# Patient Record
Sex: Male | Born: 1955
Health system: Southern US, Community
[De-identification: ages and names within clinical notes are randomized; demographics above are authoritative.]

## PROBLEM LIST (undated history)

## (undated) DIAGNOSIS — I10 Essential (primary) hypertension: Secondary | ICD-10-CM

## (undated) DIAGNOSIS — R7989 Other specified abnormal findings of blood chemistry: Secondary | ICD-10-CM

## (undated) DIAGNOSIS — I219 Acute myocardial infarction, unspecified: Secondary | ICD-10-CM

## (undated) DIAGNOSIS — E119 Type 2 diabetes mellitus without complications: Secondary | ICD-10-CM

## (undated) DIAGNOSIS — K219 Gastro-esophageal reflux disease without esophagitis: Secondary | ICD-10-CM

## (undated) DIAGNOSIS — B002 Herpesviral gingivostomatitis and pharyngotonsillitis: Secondary | ICD-10-CM

## (undated) DIAGNOSIS — I2510 Atherosclerotic heart disease of native coronary artery without angina pectoris: Secondary | ICD-10-CM

## (undated) DIAGNOSIS — N200 Calculus of kidney: Secondary | ICD-10-CM

## (undated) DIAGNOSIS — T7840XA Allergy, unspecified, initial encounter: Secondary | ICD-10-CM

## (undated) DIAGNOSIS — E785 Hyperlipidemia, unspecified: Secondary | ICD-10-CM

## (undated) HISTORY — DX: Herpesviral gingivostomatitis and pharyngotonsillitis: B00.2

## (undated) HISTORY — DX: Atherosclerotic heart disease of native coronary artery without angina pectoris: I25.10

## (undated) HISTORY — DX: Type 2 diabetes mellitus without complications: E11.9

## (undated) HISTORY — DX: Hyperlipidemia, unspecified: E78.5

## (undated) HISTORY — PX: TONSILLECTOMY AND ADENOIDECTOMY: SUR1326

## (undated) HISTORY — DX: Essential (primary) hypertension: I10

## (undated) HISTORY — DX: Other specified abnormal findings of blood chemistry: R79.89

## (undated) HISTORY — DX: Allergy, unspecified, initial encounter: T78.40XA

## (undated) HISTORY — PX: LEG WOUND REPAIR / CLOSURE: SUR1143

## (undated) HISTORY — PX: SPLENECTOMY: SUR1306

## (undated) HISTORY — PX: OTHER SURGICAL HISTORY: SHX169

## (undated) HISTORY — DX: Gastro-esophageal reflux disease without esophagitis: K21.9

## (undated) HISTORY — DX: Calculus of kidney: N20.0

## (undated) HISTORY — DX: Acute myocardial infarction, unspecified: I21.9

---
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
## 1998-06-06 ENCOUNTER — Inpatient Hospital Stay (HOSPITAL_COMMUNITY): Admission: EM | Admit: 1998-06-06 | Discharge: 1998-06-07 | Payer: Self-pay | Admitting: Cardiovascular Disease

## 1998-10-31 ENCOUNTER — Encounter: Payer: Self-pay | Admitting: Cardiology

## 1998-10-31 ENCOUNTER — Inpatient Hospital Stay (HOSPITAL_COMMUNITY): Admission: EM | Admit: 1998-10-31 | Discharge: 1998-11-02 | Payer: Self-pay | Admitting: Internal Medicine

## 1998-11-08 ENCOUNTER — Ambulatory Visit (HOSPITAL_COMMUNITY): Admission: RE | Admit: 1998-11-08 | Discharge: 1998-11-08 | Payer: Self-pay | Admitting: Gastroenterology

## 1998-11-08 ENCOUNTER — Encounter: Payer: Self-pay | Admitting: Gastroenterology

## 2001-01-30 ENCOUNTER — Ambulatory Visit (HOSPITAL_COMMUNITY): Admission: RE | Admit: 2001-01-30 | Discharge: 2001-01-30 | Payer: Self-pay | Admitting: Cardiology

## 2002-01-30 ENCOUNTER — Emergency Department (HOSPITAL_COMMUNITY): Admission: EM | Admit: 2002-01-30 | Discharge: 2002-01-30 | Payer: Self-pay | Admitting: Emergency Medicine

## 2002-01-30 ENCOUNTER — Encounter: Payer: Self-pay | Admitting: Emergency Medicine

## 2010-09-04 ENCOUNTER — Inpatient Hospital Stay (HOSPITAL_COMMUNITY)
Admission: EM | Admit: 2010-09-04 | Discharge: 2010-09-07 | Payer: Self-pay | Source: Home / Self Care | Attending: Internal Medicine | Admitting: Internal Medicine
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
## 2010-09-05 LAB — URINALYSIS, ROUTINE W REFLEX MICROSCOPIC
Bilirubin Urine: NEGATIVE
Hgb urine dipstick: NEGATIVE
Ketones, ur: NEGATIVE mg/dL
Nitrite: NEGATIVE
Protein, ur: NEGATIVE mg/dL
Specific Gravity, Urine: 1.018 (ref 1.005–1.030)
Urine Glucose, Fasting: NEGATIVE mg/dL
Urobilinogen, UA: 0.2 mg/dL (ref 0.0–1.0)
pH: 7 (ref 5.0–8.0)

## 2010-09-05 LAB — CARDIAC PANEL(CRET KIN+CKTOT+MB+TROPI)
Relative Index: 13.2 — ABNORMAL HIGH (ref 0.0–2.5)
Total CK: 212 U/L (ref 7–232)
Troponin I: 1.06 ng/mL (ref 0.00–0.06)
Troponin I: 1.44 ng/mL (ref 0.00–0.06)

## 2010-09-05 LAB — COMPREHENSIVE METABOLIC PANEL
Albumin: 3.9 g/dL (ref 3.5–5.2)
Alkaline Phosphatase: 110 U/L (ref 39–117)
BUN: 17 mg/dL (ref 6–23)
Calcium: 9.2 mg/dL (ref 8.4–10.5)
Creatinine, Ser: 0.76 mg/dL (ref 0.4–1.5)
Potassium: 3.9 mEq/L (ref 3.5–5.1)
Total Protein: 6.7 g/dL (ref 6.0–8.3)

## 2010-09-05 LAB — CBC
HCT: 41.9 % (ref 39.0–52.0)
HCT: 43.7 % (ref 39.0–52.0)
Hemoglobin: 14.3 g/dL (ref 13.0–17.0)
MCH: 32.9 pg (ref 26.0–34.0)
MCHC: 34.1 g/dL (ref 30.0–36.0)
MCV: 96.5 fL (ref 78.0–100.0)
MCV: 96.3 fL (ref 78.0–100.0)
RBC: 4.54 MIL/uL (ref 4.22–5.81)
RDW: 14 % (ref 11.5–15.5)
WBC: 20.7 10*3/uL — ABNORMAL HIGH (ref 4.0–10.5)

## 2010-09-05 LAB — LIPID PANEL
Cholesterol: 153 mg/dL (ref 0–200)
Total CHOL/HDL Ratio: 3.6 RATIO

## 2010-09-05 LAB — POCT I-STAT, CHEM 8
BUN: 18 mg/dL (ref 6–23)
Calcium, Ion: 1.07 mmol/L — ABNORMAL LOW (ref 1.12–1.32)
Chloride: 109 mEq/L (ref 96–112)
Creatinine, Ser: 0.9 mg/dL (ref 0.4–1.5)
Glucose, Bld: 118 mg/dL — ABNORMAL HIGH (ref 70–99)
HCT: 51 % (ref 39.0–52.0)
Hemoglobin: 17.3 g/dL — ABNORMAL HIGH (ref 13.0–17.0)
Potassium: 3.8 mEq/L (ref 3.5–5.1)
Sodium: 143 mEq/L (ref 135–145)
TCO2: 25 mmol/L (ref 0–100)

## 2010-09-05 LAB — RAPID URINE DRUG SCREEN, HOSP PERFORMED
Benzodiazepines: NOT DETECTED
Cocaine: NOT DETECTED
Opiates: NOT DETECTED

## 2010-09-05 LAB — BASIC METABOLIC PANEL
BUN: 10 mg/dL (ref 6–23)
Chloride: 109 mEq/L (ref 96–112)
Creatinine, Ser: 0.77 mg/dL (ref 0.4–1.5)
GFR calc Af Amer: >60 mL/min (ref >60)
GFR calc non Af Amer: >60 mL/min (ref >60)
Potassium: 3.6 mEq/L (ref 3.5–5.1)

## 2010-09-05 LAB — PROTIME-INR
INR: 1.16 (ref 0.00–1.49)
INR: 1.29 (ref 0.00–1.49)
Prothrombin Time: 15 seconds (ref 11.6–15.2)

## 2010-09-05 LAB — POCT CARDIAC MARKERS
CKMB, poc: 2.6 ng/mL (ref 1.0–8.0)
Myoglobin, poc: 67.9 ng/mL (ref 12–200)
Troponin i, poc: <0.05 ng/mL (ref 0.00–0.09)

## 2010-09-05 LAB — TSH: TSH: 2.2 u[IU]/mL (ref 0.350–4.500)

## 2010-09-05 LAB — APTT: aPTT: 40 seconds — ABNORMAL HIGH (ref 24–37)

## 2010-09-05 LAB — TROPONIN I: Troponin I: 0.18 ng/mL — ABNORMAL HIGH (ref 0.00–0.06)

## 2010-09-05 LAB — MRSA PCR SCREENING: MRSA by PCR: NEGATIVE

## 2010-09-05 LAB — CK TOTAL AND CKMB (NOT AT ARMC)
CK, MB: 5.2 ng/mL — ABNORMAL HIGH (ref 0.3–4.0)
Relative Index: 3.8 — ABNORMAL HIGH (ref 0.0–2.5)
Total CK: 136 U/L (ref 7–232)

## 2010-09-06 LAB — HEPARIN LEVEL (UNFRACTIONATED): Heparin Unfractionated: <0.1 IU/mL — ABNORMAL LOW (ref 0.30–0.70)

## 2010-09-06 LAB — CARDIAC PANEL(CRET KIN+CKTOT+MB+TROPI)
CK, MB: 255.5 ng/mL (ref 0.3–4.0)
CK, MB: 298.1 ng/mL (ref 0.3–4.0)
CK, MB: 36 ng/mL (ref 0.3–4.0)
Relative Index: 12.1 — ABNORMAL HIGH (ref 0.0–2.5)
Relative Index: 12.4 — ABNORMAL HIGH (ref 0.0–2.5)
Total CK: 2413 U/L — ABNORMAL HIGH (ref 7–232)
Total CK: 1032 U/L — ABNORMAL HIGH (ref 7–232)
Total CK: 589 U/L — ABNORMAL HIGH (ref 7–232)
Troponin I: 51.64 ng/mL (ref 0.00–0.06)
Troponin I: 20.19 ng/mL (ref 0.00–0.06)
Troponin I: 11.94 ng/mL (ref 0.00–0.06)

## 2010-09-06 LAB — BASIC METABOLIC PANEL
CO2: 24 mEq/L (ref 19–32)
CO2: 22 mEq/L (ref 19–32)
Calcium: 8.9 mg/dL (ref 8.4–10.5)
Chloride: 108 mEq/L (ref 96–112)
Creatinine, Ser: 0.66 mg/dL (ref 0.4–1.5)
GFR calc Af Amer: >60 mL/min (ref >60)
GFR calc Af Amer: >60 mL/min (ref >60)
GFR calc non Af Amer: >60 mL/min (ref >60)
Potassium: 3.9 mEq/L (ref 3.5–5.1)
Sodium: 140 mEq/L (ref 135–145)
Sodium: 139 mEq/L (ref 135–145)

## 2010-09-06 LAB — CBC
HCT: 41.3 % (ref 39.0–52.0)
HCT: 44.3 % (ref 39.0–52.0)
Hemoglobin: 13.9 g/dL (ref 13.0–17.0)
MCH: 32.5 pg (ref 26.0–34.0)
MCHC: 33.7 g/dL (ref 30.0–36.0)
MCHC: 33.6 g/dL (ref 30.0–36.0)
MCV: 96.7 fL (ref 78.0–100.0)
Platelets: 250 10*3/uL (ref 150–400)
RBC: 4.29 MIL/uL (ref 4.22–5.81)
RDW: 13.9 % (ref 11.5–15.5)
WBC: 16.5 10*3/uL — ABNORMAL HIGH (ref 4.0–10.5)

## 2010-09-12 NOTE — Procedures (Signed)
[name], [name] NO.:  [id number]  MEDICAL RECORD NO.:  [id number]          PATIENT TYPE:  INP  LOCATION:  2903                         FACILITY:  [facility]  PHYSICIAN:  Veverly Fells. Excell Seltzer, MD  DATE OF BIRTH:  [birth year]  DATE OF PROCEDURE:  09/04/2010 DATE OF DISCHARGE:                           CARDIAC CATHETERIZATION   PROCEDURE: 1. Percutaneous transluminal coronary angioplasty and stenting of the     left circumflex into the first obtuse marginal branch. 2. Perclose of the right femoral artery.  PROCEDURAL INDICATION:  Mr. Loper is a 55 year old gentleman who underwent stenting of the left circumflex with a bare metal stent earlier today.  This evening,  he developed severe substernal chest pain with inferolateral ST-segment elevation and a code STEMI was called. Cath lab team came in and the patient was brought to the cath lab emergently.  Acute stent thrombosis was suspected.  An emergency consent was obtained for the procedure.  The patient had been given 4000 units of heparin and was started on nitroglycerin drip up on the floor.  His right groin was prepped, draped, and anesthetized with 1% lidocaine using modified Seldinger technique.  A 6-French sheath was placed in the right femoral artery.  A 6-French XB3.5-cm guide catheter was inserted.  Heparin and Integrilin were used for anticoagulation.  Integrilin was administered via a double bolus and drip protocol.  The patient was given an additional 30 mg of Effient at the completion of this procedure.  Angiography was performed and it demonstrated diffuse tapering of  the distal edge of the stent with total occlusion of the distal OM branch.  Coronary dissection was suspected with the presumed mechanism being distal stent edge dissection.  Initially I advanced a 3.5 x 15-mm Trek balloon off the distal edge of the previously placed stent.  The balloon was inflated to 6 atmospheres for two  inflations.  This did not significantly change the appearance of the vessel.  I elected to stent off the distal portion of the previously placed stent with a 3.5 x 18-mm Multi-Link Vision stent which was deployed at 12 atmospheres.  The overlapped portion was then inflated to 16 atmospheres with the stent balloon.  There continued to be total occlusion of the distal OM with residual ST elevation and diffuse narrowing of the distal edge consistent with extensive residual coronary dissection.  At that point, I felt that the best potential treatment was to convert to drug-eluting stent, so it was clear that a long segment of vessel would need to be covered.  A 3.5 x 28-mm Promus Element was positioned and deployed at 10 atmospheres.  Another 3.5 x 16- mm Promus Element was deployed at 16 atmospheres, so that it overlapped with the previously placed bare metal stent.  The entire segment was postdilated with 4.0 x 20-mm [place] Trek balloon which was taken to maximum pressure of 16 atmospheres for a total of three inflations.  At that point, there was a good result through the large part of the OM, but there continued to be residual dissection distally and I was concerned about the lack of  distal runoff with dye staining in and poor distal runoff.  The OM divided into two subbranches and the superior branch was wired with a Cougar wire.  A 2.0 x 20-mm Apex was then advanced and inflated to 8 atmospheres for prolonged inflations.  This actually worsened the appearance of the vessel and there was TIMI 0 flow beyond the stented segment.  I was able to advance a Whisper wire into the inferior larger OM subbranch and another balloon inflation was performed with a 2.0 x 20 balloon.  There continued to be dissection and poor runoff.  I felt the only other option at that point was to put a small drug-eluting stent down to see if this would tack up the dissection plane.  A 2.5 x 28-mm Promus Element stent  was positioned and deployed at 8 atmospheres.  There was an excellent result.  The overlapped segment was dilated with a stent balloon to 16 atmospheres.  This dramatically changed to distal runoff and there was now TIMI3 flow.  The patient's ST segments normalized.  The superior branch had residual narrowing, but continued to have good flow.  There was a very small probably 1 mm gap between the final two stents and I have elected to treat that with a 3.5 x 8-mm Promus stent, which was deployed at 16 atmospheres to cover the overlapped portion.  A 3.5 x 20-mm [place] Trek balloon was then used to dilate the overlapped area to 16 atmospheres. Final angiography demonstrated 0% residual stenosis and TIMI3 flow in the vessel into both OM subbranches.  The femoral angiogram was performed and a Perclose device was used for femoral hemostasis.  FINAL CONCLUSION:  Acute inferolateral myocardial infarction secondary to probable extensive stent edge dissection.  Multiple overlapping stents were placed as described above.  This was a complex procedure secondary to multiple overlapping stents as well as prolonged fluoroscopy time.  RECOMMENDATIONS:  A 2-D echocardiogram in the morning.  Ventriculography was deferred because of contrast volume used during the procedure well. We will aggressively hydrate the patient.  He should remain on aspirin and Effient for a minimum of 12 months and preferably long-term.     Veverly Fells. Excell Seltzer, MD     MDC/MEDQ  D:  09/04/2010  T:  09/05/2010  Job:  [id number]  Electronically Signed by Tonny Bollman MD on 09/12/2010 04:58:10 AM

## 2010-09-12 NOTE — Procedures (Signed)
NAMEALANI, [name] NO.:  [id number]  MEDICAL RECORD NO.:  [id number]          PATIENT TYPE:  INP  LOCATION:  2903                         FACILITY:  [facility]  PHYSICIAN:  Veverly Fells. Excell Seltzer, MD  DATE OF BIRTH:  May 12, 1956  DATE OF PROCEDURE:  09/04/2010 DATE OF DISCHARGE:                           CARDIAC CATHETERIZATION   PROCEDURE: 1. Left heart catheterization. 2. Selective coronary angiography. 3. Percutaneous transluminal coronary angioplasty and stenting, left     circumflex.  PROCEDURAL INDICATION:  [name] is a 55 year old gentleman who has had multiple myocardial infarctions in the past.  He has undergone previous balloon angioplasty to the PDA.  He has not been followed up since 2002.  He presented with a non-ST-elevation infarction, was referred for cardiac cath.  Risks and indication of the procedure were reviewed with the patient, informed consent was obtained.  Left wrist was prepped, draped, and anesthetized with 1% lidocaine using modified Seldinger technique.  A 6- French sheath was placed in the left radial artery, 3 mg of verapamil was administered through the sheath, 3000 units of fractionated heparin was given intravenously.  Standard Judkins catheters were used for coronary angiography.  It was difficult to visualize the left circumflex with a JL-4 catheter or a JL-5 catheter.  Therefore, a 5-French EBU3.5- cm guide catheter was inserted.  This was selected in the left circumflex and we were able to see that there was an eccentric proximal stenosis that appeared to be a ruptured plaque.  At that point, bivalirudin was started for anticoagulation.  The patient was given 60 mg of Effient.  A Cougar guidewire was advanced into the distal circumflex, but I could not advance it into the distal OM branch.  A Whisper wire was then advanced down into the OM, which was the largest vessel for distal runoff.  The vessel was predilated with 3.0  x 12-mm Apex balloon, which was taken to 8 atmospheres for two inflations.  The vessel was then stented with a 4.0 x 15-mm Multi-Link Vision stent, which was carefully positioned and deployed at 14 atmospheres.  The stent appeared well expanded.  There was a good angiographic result with TIMI3 flow.  There was some mild plaque off the distal edge of the stent, but this appeared to be no more than 30% angiographic stenosis. Since I used a bare metal stent platform I elected not to cover this area with a second stent.  The guide catheter and wire were removed, and the patient was transferred to the recovery area in stable condition.  A TR band was used for radial hemostasis.  PROCEDURAL FINDINGS:  The left mainstem is short.  The vessels are patent and divides into the LAD and left circumflex.  LAD:  The LAD has diffuse plaque.  There is 30-40% proximal stenosis with mild hypodensity in that area.  There is 50% mid stenosis in the ostium of the second diagonal branch, has an 80% stenosis.  This is a small diagonal.  The distal LAD has diffuse plaque, but no high-grade stenosis.  Left circumflex:  There is an 80% eccentric proximal stenosis present.  The first OM branch is very large with no significant stenosis.  The AV groove circumflex is patent beyond the first OM branch and is a much smaller vessel.  Right coronary artery:  The right coronary artery has serial 50% stenoses throughout the proximal mid and distal portions.  There is heavy plaque, but there are no areas of high-grade stenosis present. The PDA has ostial 80% stenosis and then has subtotal occlusion.  There are left-to-right collaterals filling the distal PDA.  The posterolateral branch is patent.  FINAL ASSESSMENT: 1. Severe left circumflex stenosis with successful percutaneous     intervention using a large bare metal stent. 2. Diffuse nonobstructive left anterior descending stenosis with     severe stenosis of the  small diagonal branch. 3. Diffuse nonobstructive right coronary artery stenosis with distal     posterior descending artery occlusion and left-to-right     collaterals.  RECOMMENDATIONS:  The patient should continue on aspirin and Effient for a minimum of 30 days and ideally for 1 year.  He will need aggressive risk reduction measures and complete tobacco cessation.     Veverly Fells. Excell Seltzer, MD     MDC/MEDQ  D:  09/04/2010  T:  09/05/2010  Job:  [id number]  Electronically Signed by Tonny Bollman MD on 09/12/2010 04:57:56 AM

## 2010-09-13 NOTE — H&P (Signed)
[name], [name] NO.:  [id number]  MEDICAL RECORD NO.:  [id number]          PATIENT TYPE:  EMS  LOCATION:  MAJO                         FACILITY:  [facility]  PHYSICIAN:  Eduard Clos, MDDATE OF BIRTH:  [birth year]  DATE OF ADMISSION:  09/04/2010 DATE OF DISCHARGE:                             HISTORY & PHYSICAL   PRIMARY CARE PHYSICIAN:  At [facility] [place].  CHIEF COMPLAINT:  Chest pain.  HISTORY OF PRESENT ILLNESS:  A 55 year old male with a known history of CAD status post angioplasty, hyperlipidemia, hypertension, previous history of kidney stones, presented with complaint of chest pain.  The patient has been having chest pain for the last 24 hours which started off yesterday at 6 a.m.  It has been present off and on, multiple times at least 12-14 times, each time it lasts for 5 minutes.  Has no relation to exertion.  It is more on the left side of the chest and the chest wall, sometimes radiating to the left shoulder and left arm.  Has no relation to exertion, has no diaphoresis or shortness of breath.  No nausea, vomiting, dizziness or loss of consciousness, headache, visual symptoms, abdominal pain, dysuria, discharge, or diarrhea.  The patient's chest pain in the ER was relieved by nitroglycerin.  The patient has been admitted for further workup.  PAST MEDICAL HISTORY: 1. History of CAD status post angioplasty. 2. Hypertension. 3. Hyperlipidemia. 4. GERD.  PAST SURGICAL HISTORY:  Cardiac cath and angioplasty, has not been stented before.  FAMILY HISTORY:  Significant for coronary artery disease.  SOCIAL HISTORY:  The patient smokes cigarettes, drinks alcohol liquor twice a week at least.  Denies any drug abuse.  Lives with a friend.  ALLERGIES:  No known drug allergies.  REVIEW OF SYSTEMS:  As per the history of presenting illness, nothing else significant.  PHYSICAL EXAMINATION:  GENERAL:  The patient  examined at bedside, not in acute distress. VITAL SIGNS:  Blood pressure 130/78, pulse 60 per minute, temperature 98.1, respirations 18 per minute, O2 sat 99%. HEENT:  Anicteric.  No pallor.  No discharge from ears, eyes, nose, or mouth. CHEST:  Bilateral air entry present.  No rhonchi.  No crepitation. HEART:  S1, S2 heard. ABDOMEN:  Soft, nontender.  Bowel sounds are present. CNS:  The patient is alert, awake; oriented to time, place, and person. Moves upper and lower extremities 5/5. EXTREMITIES:  Peripheral pulses are felt.  No edema.  LABORATORY DATA:  EKG shows normal sinus rhythm with poor R-wave progression and heart rate is around 63 beats per minute with nonspecific ST-T changes.  Chest x-ray shows mild vascular congestion. Hemoglobin 17.3, hematocrit is 51.  Basic metabolic panel; sodium 143, potassium 3.8, chloride 109, glucose 118, BUN 18, creatinine 0.9.  CK-MB 2.6, troponin less than 0.05, myoglobin 67.9.  UA is negative.  ASSESSMENT: 1. Chest pain to rule out acute coronary syndrome. 2. History of congestive heart failure with angioplasty. 3. History of hypertension. 4. History of hyperlipidemia. 5. Ongoing tobacco abuse.  PLAN: 1. At this time, I will admit the patient to telemetry. 2.  For his chest pain, the patient will be on aspirin.     Cycle the cardiac markers.  Cardiology consult as the patient has     had history of angioplasty. 3. I am going to check a CMET and lipase STAT.  If lipase is negative,     we will place the patient on IV heparin. 4. For his hypertension and hyperlipidemia, we will continue his     present home medication and further recommendation based on     condition evolves.     Eduard Clos, MD     ANK/MEDQ  D:  09/04/2010  T:  09/04/2010  Job:  [id number]  cc:   [facility]  Electronically Signed by Midge Minium MD on 09/13/2010 10:03:59 AM

## 2010-09-15 NOTE — Discharge Summary (Signed)
NAMEPERRY, BRUCATO NO.:  [id number]  MEDICAL RECORD NO.:  [id number]          PATIENT TYPE:  INP  LOCATION:  2027                         FACILITY:  [facility]  PHYSICIAN:  Veverly Fells. Excell Seltzer, MD  DATE OF BIRTH:  [birth year]  DATE OF ADMISSION:  09/04/2010 DATE OF DISCHARGE:  09/07/2010                              DISCHARGE SUMMARY   PROCEDURES: 1. Cardiac catheterization. 2. Coronary arteriogram. 3. Left ventriculogram. 4. Percutaneous transluminal coronary angioplasty and drug-eluting     stents x4 to the circumflex and obtuse marginal as well as a bare-     metal stent.  PRIMARY FINAL DISCHARGE DIAGNOSES: 1. Acute inferolateral non-ST-segment elevation myocardial infarction.  SECONDARY DIAGNOSES: 1. Remote history of percutaneous transluminal coronary angioplasty to     the posterior descending (coronary) artery. 2. Hypertension. 3. Hyperlipidemia. 4. Nephrolithiasis. 5. Ongoing tobacco use. 6. Gastroesophageal reflux disease. 7. Family history of coronary artery disease. 8. Remote history of myocardial infarction x4 between 9 and 14 years     ago, no further details available. 9.History of oral herpes simplex. 10.Preserved left ventricular function with an ejection fraction of     55% by echocardiogram this admission, moderate left ventricular     hypertrophy and diastolic dysfunction, not addressed. 11.Obesity.  TIME AT DISCHARGE:  38 minutes.  HOSPITAL COURSE:  [name] is a 55 year old male with a history of coronary artery disease.  He had chest pain and came to the hospital where he was admitted for further evaluation and treatment.  His cardiac enzymes and his white count were elevated indicating a non- ST-segment elevation MI.  His urine drug screen was positive only for THC.  A lipid profile showed triglycerides 342, HDL 43, and LDL 42.  He was taken to the cath lab on September 04, 2010.  He had severe left circumflex stenosis and no  ruptured plaque was suspected.  He had a bare- metal stent which was successful.  He had moderate diffuse LAD disease between 30 and 50% and the diagonal had an 80% stenosis but is a small vessel, medical therapy is the best option.  The RCA had serial 50% lesions.  The PDA had a subtotal occlusion with left-to-right collaterals, and medical therapy was recommended.  Later on September 04, 2010, [name] had sudden onset of severe chest pain.  He had ST elevation and stent thrombosis was suspected.  He was very symptomatic.  He was anticoagulated and taken back to the cath lab.  [name] had PTCA with overlapping stents to the circumflex and OM-1. The occlusion in the circumflex was distal to the previously placed stent and continued into the OM-1.  Stent edge dissection was suspected, and it was treated with multiple overlapping drug-eluting stents and prolonged balloon inflation.  This was a successful but complex percutaneous intervention.  A 2-D echocardiogram had been performed which showed preserved left ventricular function.  This was repeated after his repeat intervention and still showed an EF of 55% and moderate LVH.  [name] was seen by smoking cessation and cardiac rehab.  He was instructed on a heart- healthy lifestyle  and methods for smoking cessation.  He had no further episodes of chest pain.  By September 07, 2010, his cardiac enzymes were trending down.  His white count was also trending down from a high of 20,000.  He was ambulating without chest pain or shortness of breath and considered stable for discharge, to follow up as an outpatient.  DISCHARGE INSTRUCTIONS: 1. His activity level is to be increased gradually. 2. He is to call our office for problems with the cath site. 3. He is not to use tobacco. 4. He is to follow up with an Dr. Antoine Poche in the [facility] on     September 20, 2010, at 9:15 and with Paulita Cradle at [facility]  Medicine as needed.  DISCHARGE MEDICATIONS: 1. Tylenol 325 mg one to two tablets q.4 h. p.r.n. 2. Lisinopril 5 mg daily. 3. Toprol-XL 50 mg daily. 4. Lipitor 40 mg daily. 5. Valtrex 500 mg daily. 6. Aspirin 325 mg daily. 7. Prasugrel 10 mg daily. 8. Nexium 40 mg daily. 9. Allegra 180 mg daily. 10.Vitamin D2 - 50,000 units weekly. 11.Sublingual nitroglycerin p.r.n.     Theodore Demark, PA-C   ______________________________ Veverly Fells. Excell Seltzer, MD    RB/MEDQ  D:  09/07/2010  T:  09/08/2010  Job:  [id number]  cc:   Paulita Cradle, nurse practitioner  Electronically Signed by Theodore Demark PA-C on 09/13/2010 02:49:58 PM Electronically Signed by Tonny Bollman MD on 09/15/2010 02:03:42 PM

## 2010-09-20 ENCOUNTER — Encounter: Payer: Self-pay | Admitting: Cardiology

## 2010-09-20 ENCOUNTER — Ambulatory Visit (INDEPENDENT_AMBULATORY_CARE_PROVIDER_SITE_OTHER): Payer: 59 | Admitting: Cardiology

## 2010-09-20 DIAGNOSIS — I25119 Atherosclerotic heart disease of native coronary artery with unspecified angina pectoris: Secondary | ICD-10-CM | POA: Insufficient documentation

## 2010-09-20 DIAGNOSIS — I2510 Atherosclerotic heart disease of native coronary artery without angina pectoris: Secondary | ICD-10-CM | POA: Insufficient documentation

## 2010-09-20 DIAGNOSIS — I119 Hypertensive heart disease without heart failure: Secondary | ICD-10-CM | POA: Insufficient documentation

## 2010-09-20 DIAGNOSIS — E785 Hyperlipidemia, unspecified: Secondary | ICD-10-CM | POA: Insufficient documentation

## 2010-09-20 DIAGNOSIS — F17200 Nicotine dependence, unspecified, uncomplicated: Secondary | ICD-10-CM

## 2010-09-20 DIAGNOSIS — E669 Obesity, unspecified: Secondary | ICD-10-CM | POA: Insufficient documentation

## 2010-09-20 DIAGNOSIS — K219 Gastro-esophageal reflux disease without esophagitis: Secondary | ICD-10-CM | POA: Insufficient documentation

## 2010-09-20 DIAGNOSIS — N200 Calculus of kidney: Secondary | ICD-10-CM | POA: Insufficient documentation

## 2010-09-28 NOTE — Assessment & Plan Note (Signed)
Summary: [facility]   Visit Type:  Follow-up Primary Provider:  Paulita Cradle, NP  CC:  CAD.  History of Present Illness: The patient presents for followup after recent hospitalization for NQWM. He presented with chest discomfort and was found to have a ruptured plaque in the circumflex and underwent bare-metal stenting. However, he had subsequent occlusion of the stent treated with multiple overlapping drug-eluting stents into an obtuse marginal. He subsequently had resolution of his symptoms.  Since that time the patient has had no new cardiovascular symptoms. He has been doing some activities around his nose but not exercising routinely. With these activities he's not had any chest pressure, neck or arm discomfort. He is not having any palpitations, presyncope or syncope. He has had no new weight gain or edema. He said no problems with either the left arm catheterization site or the right femoral catheterization site. He has not been smoking cigarettes. He continues to take the meds as listed.  Current Medications (verified): 1)  Acetaminophen 325 Mg  Tabs (Acetaminophen) .... As Needed 2)  Aspirin 325 Mg  Tabs (Aspirin) .Marland Kitchen.. 1 By Mouth Daily 3)  Lisinopril 5 Mg Tabs (Lisinopril) .Marland Kitchen.. 1 By Mouth Daily 4)  Nitrostat 0.4 Mg Subl (Nitroglycerin) .... As Needed 5)  Allegra Allergy 180 Mg Tabs (Fexofenadine Hcl) .Marland Kitchen.. 1 By Mouth Daily 6)  Lipitor 40 Mg Tabs (Atorvastatin Calcium) .Marland Kitchen.. 1 By Mouth Daily 7)  Metoprolol Succinate 50 Mg Xr24h-Tab (Metoprolol Succinate) .Marland Kitchen.. 1 By Mouth Daily 8)  Nexium 40 Mg Cpdr (Esomeprazole Magnesium) .Marland Kitchen.. 1 By Mouth Dialy 9)  Niaspan 1000 Mg Cr-Tabs (Niacin (Antihyperlipidemic)) .Marland Kitchen.. 1 By Mouth Daily 10)  Valtrex 500 Mg Tabs (Valacyclovir Hcl) .Marland Kitchen.. 1 By Mouth Daily 11)  Vitamin D3 50000 Unit Caps (Cholecalciferol) .Marland Kitchen.. 1 By Mouth Weeekly 12)  Effient 10 Mg Tabs (Prasugrel Hcl) .Marland Kitchen.. 1 By Mouth Daily  Allergies (verified): No Known Drug  Allergies  Past History:  Past Medical History: 1. Coronary artery disease with prior history of MI at age 63, 28, 2, 35 and 21  per the patient. Last cath with preserved ejection fraction. Moderate diffuse 30-50% stenosis, a small 80% diagonal stenosis, right coronary artery 50% serial lesions, PA subtotal occlusion. He is now status post PTCA and stenting with non-drug-eluting stent followed by multiple drug-eluting stents in the circumflex. January 2012.) 2. Hypertension. 3. Hyperlipidemia. 4. Gastroesophageal reflux disease. 5. Kidney stones, remotely. 6. Oral herpes simplex.  Past Surgical History: None  Review of Systems       As stated in the HPI and negative for all other systems.   Vital Signs:  Patient profile:   55 year old male Height:      70 inches Weight:      233 pounds BMI:     33.55 Pulse rate:   71 / minute Resp:     16 per minute BP sitting:   124 / 88  (right arm)  Vitals Entered By: Marrion Coy, CNA (September 20, 2010 9:24 AM)  Physical Exam  General:  Well developed, well nourished, in no acute distress. Head:  normocephalic and atraumatic Eyes:  PERRLA/EOM intact; conjunctiva and lids normal. Mouth:  Teeth, gums and palate normal. Oral mucosa normal. Neck:  Neck supple, no JVD. No masses, thyromegaly or abnormal cervical nodes. Chest Wall:  no deformities or breast masses noted Lungs:  Clear bilaterally to auscultation and percussion. Abdomen:  Bowel sounds positive; abdomen soft and non-tender without masses, organomegaly, or hernias noted.  No hepatosplenomegaly. Msk:  Back normal, normal gait. Muscle strength and tone normal. Extremities:  No clubbing or cyanosis. Neurologic:  Alert and oriented x 3. Skin:  Intact without lesions or rashes. Cervical Nodes:  no significant adenopathy Inguinal Nodes:  no significant adenopathy Psych:  Normal affect.   Detailed Cardiovascular Exam  Neck    Carotids: Carotids full and equal bilaterally  without bruits.      Neck Veins: Normal, no JVD.    Heart    Inspection: no deformities or lifts noted.      Palpation: normal PMI with no thrills palpable.      Auscultation: regular rate and rhythm, S1, S2 without murmurs, rubs, gallops, or clicks.    Vascular    Abdominal Aorta: no palpable masses, pulsations, or audible bruits.      Femoral Pulses: normal femoral pulses bilaterally.      Pedal Pulses: normal pedal pulses bilaterally.      Radial Pulses: normal radial pulses bilaterally.      Peripheral Circulation: no clubbing, cyanosis, or edema noted with normal capillary refill.     EKG  Procedure date:  09/20/2010  Findings:      sinus rhythm, rate 71, axis within normal limits, intervals within normal limits, no acute ST-T wave changes.  Impression & Recommendations:  Problem # 1:  CAD (ICD-414.00) We have reviewed risk reduction extensively. No further testing is indicated at this point. He understands clearly not stopped years Effient.  Problem # 2:  HYPERTENSION (ICD-401.9) His blood pressure is controlled and he will continue the meds as listed.  Problem # 3:  TOBACCO ABUSE (ICD-305.1) We spent greater than 3 minutes discussing the need to stop smoking. He will try cold [place].  Problem # 4:  OBESITY, UNSPECIFIED (ICD-278.00) We discussed the need to lose weight with diet and exercise.  Problem # 5:  DYSLIPIDEMIA (ICD-272.4)  His last LDL was 59 with an HDL of 40. This is at target and I will defer to his primary provider.  His updated medication list for this problem includes:    Lipitor 40 Mg Tabs (Atorvastatin calcium) .Marland Kitchen... 1 by mouth daily    Niaspan 1000 Mg Cr-tabs (Niacin (antihyperlipidemic)) .Marland Kitchen... 1 by mouth daily  Other Orders: EKG w/ Interpretation (93000)  Patient Instructions: 1)  Your physician recommends that you schedule a follow-up appointment in: 3 months with Dr Antoine Poche 2)  Your physician recommends that you continue on your current  medications as directed. Please refer to the Current Medication list given to you today.

## 2010-10-10 ENCOUNTER — Ambulatory Visit: Payer: Self-pay | Admitting: Cardiology

## 2010-10-24 NOTE — Letter (Signed)
Summary: [employer] Return to Work Request Form   [employer] Return to Work Request Form   Imported By: Roderic Ovens 10/19/2010 15:34:58  _____________________________________________________________________  External Attachment:    Type:   Image     Comment:   External Document

## 2010-12-26 ENCOUNTER — Encounter: Payer: Self-pay | Admitting: Cardiology

## 2010-12-27 ENCOUNTER — Encounter: Payer: Self-pay | Admitting: Cardiology

## 2010-12-27 ENCOUNTER — Ambulatory Visit (INDEPENDENT_AMBULATORY_CARE_PROVIDER_SITE_OTHER): Payer: 59 | Admitting: Cardiology

## 2010-12-27 DIAGNOSIS — E785 Hyperlipidemia, unspecified: Secondary | ICD-10-CM

## 2010-12-27 DIAGNOSIS — E669 Obesity, unspecified: Secondary | ICD-10-CM

## 2010-12-27 DIAGNOSIS — I2510 Atherosclerotic heart disease of native coronary artery without angina pectoris: Secondary | ICD-10-CM

## 2010-12-27 DIAGNOSIS — I10 Essential (primary) hypertension: Secondary | ICD-10-CM

## 2010-12-27 DIAGNOSIS — F17200 Nicotine dependence, unspecified, uncomplicated: Secondary | ICD-10-CM

## 2010-12-27 NOTE — Assessment & Plan Note (Signed)
I reviewed his recent lab. Couple of days ago his LDL was 69 with an HDL of 33. I will encourage increased exercise but he will otherwise remain on the meds as listed. Of note he is having some joint pain but wants to persist with Lipitor.

## 2010-12-27 NOTE — Assessment & Plan Note (Signed)
The patient has no new sypmtoms.  No further cardiovascular testing is indicated.  We will continue with aggressive risk reduction and meds as listed.  

## 2010-12-27 NOTE — Assessment & Plan Note (Signed)
The blood pressure is at target. No change in medications is indicated. We will continue with therapeutic lifestyle changes (TLC).  
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
## 2010-12-27 NOTE — Assessment & Plan Note (Signed)
He has lost some weight and the epiglottis and encourage more of the same.

## 2010-12-27 NOTE — Assessment & Plan Note (Signed)
The patient is abstaining!

## 2010-12-27 NOTE — Patient Instructions (Signed)
Continue current medications Follow up in 12 months with Dr Antoine Poche in [place]

## 2010-12-29 NOTE — Cardiovascular Report (Signed)
[place]  Patient:    [name], [name]                       MRN: [id number] Proc. Date: 01/30/01 Adm. Date:  [id number] Disc. Date: [id number] Attending:  Rollene Rotunda CC:         Monica Becton, M.D.   Cardiac Catheterization  DATE OF BIRTH:  09/18/1955  PRIMARY PHYSICIAN:  Monica Becton, M.D.  INDICATIONS:  Evaluate patient with known coronary disease, status post recent angioplasty of a posterior descending vessel.  The patient has had recurrent chest pain.  DESCRIPTION OF PROCEDURE:  Left heart catheterization was performed via the left femoral artery.  The artery was cannulated using an anterior wall puncture.  A #6 French arterial sheath was inserted via the modified Seldinger technique.  Preformed Judkins and a pigtail catheter were utilized.  The patient tolerated the procedure well and left the lab in stable condition.  RESULTS:  HEMODYNAMICS:  LV 115/11, AO 115/66.  CORONARY ARTERIOGRAPHY:  Left main:  The left main was very short with essentially separate ostia.  Left anterior descending:  Proximal shelflike 30% stenosis after the first diagonal, 25% mid stenosis, 40% mid stenosis involving the second diagonal, diffuse luminal irregularities throughout, first diagonal had a 80% stenosis in the sub-branch and a 50% stenosis in a superior branch.  Circumflex:  The circumflex had mid 40% stenosis involving an obtuse marginal.  The right coronary artery had mid 60% stenosis.  There was a 95% stenosis and a small PDA in the mid segment.  The posterolateral which had previously been angioplastied was widely patent.  LEFT VENTRICULOGRAM.  The left ventriculogram was obtained in the RAO projection.  The EF was approximately 55% with mild inferior hypokinesis.  CONCLUSIONS:  The patients coronary anatomy is slightly progressed compared with the catheterization in March 2000.  However, the previously  stented posterolateral is widely patent.  The highest grade lesion and the only one that appears to be obstructed is in a small PDA branch.  This is not amenable to percutaneous revascularization.  PLAN:  The patient will continue to have aggressive secondary risk factor modification.  No further cardiovascular testing is planned at this point. DD:  01/30/01 TD:  01/30/01 Job: [id number]

## 2011-03-07 ENCOUNTER — Telehealth: Payer: Self-pay | Admitting: Cardiology

## 2011-03-07 NOTE — Telephone Encounter (Signed)
Per [facility] Effient is on back order and pt needs samples.  Will call pt and have him come by the office to pick some up.

## 2011-03-07 NOTE — Telephone Encounter (Signed)
Per Lennox Laity- effient is on back order. What's the next step if meds is still on back order.

## 2011-03-08 ENCOUNTER — Telehealth: Payer: Self-pay | Admitting: Cardiology

## 2011-03-08 NOTE — Telephone Encounter (Signed)
Per pt call, one of pt RX is back ordered. Pt said [facility] had some RX samples and would leave them at the front for pt. Pt called around other [facility] around town and found his RX therefore he doesn't need the RX samples.

## 2011-03-08 NOTE — Telephone Encounter (Signed)
noted 

## 2011-12-10 ENCOUNTER — Other Ambulatory Visit: Payer: Self-pay

## 2011-12-10 ENCOUNTER — Other Ambulatory Visit: Payer: Self-pay | Admitting: Cardiovascular Disease

## 2011-12-10 MED ORDER — LISINOPRIL 5 MG PO TABS: 5.0000 mg | ORAL_TABLET | Freq: Every day | ORAL | Status: DC

## 2012-03-04 ENCOUNTER — Other Ambulatory Visit: Payer: Self-pay | Admitting: Cardiology

## 2012-04-05 ENCOUNTER — Other Ambulatory Visit: Payer: Self-pay | Admitting: Cardiology

## 2012-04-07 ENCOUNTER — Telehealth: Payer: Self-pay | Admitting: Cardiology

## 2012-04-07 NOTE — Telephone Encounter (Signed)
New Problem:   Called in requesting a 90 day supply of lisinopril (PRINIVIL,ZESTRIL) 5 MG tablet per the patient's insurance co.  Please call back.

## 2012-04-09 NOTE — Telephone Encounter (Signed)
Rx have been filled on 04/07/12

## 2012-06-22 ENCOUNTER — Other Ambulatory Visit: Payer: Self-pay | Admitting: Cardiovascular Disease

## 2012-07-05 ENCOUNTER — Encounter (HOSPITAL_COMMUNITY): Payer: Self-pay | Admitting: Emergency Medicine

## 2012-07-05 ENCOUNTER — Emergency Department (HOSPITAL_COMMUNITY)
Admission: EM | Admit: 2012-07-05 | Discharge: 2012-07-06 | Discharge status: Against Medical Advice | Payer: 59 | Attending: Emergency Medicine | Admitting: Emergency Medicine

## 2012-07-05 DIAGNOSIS — Z87442 Personal history of urinary calculi: Secondary | ICD-10-CM | POA: Insufficient documentation

## 2012-07-05 DIAGNOSIS — Z87891 Personal history of nicotine dependence: Secondary | ICD-10-CM | POA: Insufficient documentation

## 2012-07-05 DIAGNOSIS — S0180XA Unspecified open wound of other part of head, initial encounter: Secondary | ICD-10-CM | POA: Insufficient documentation

## 2012-07-05 DIAGNOSIS — S060X9A Concussion with loss of consciousness of unspecified duration, initial encounter: Secondary | ICD-10-CM

## 2012-07-05 DIAGNOSIS — Z79899 Other long term (current) drug therapy: Secondary | ICD-10-CM | POA: Insufficient documentation

## 2012-07-05 DIAGNOSIS — S060X0A Concussion without loss of consciousness, initial encounter: Secondary | ICD-10-CM | POA: Insufficient documentation

## 2012-07-05 DIAGNOSIS — Z7982 Long term (current) use of aspirin: Secondary | ICD-10-CM | POA: Insufficient documentation

## 2012-07-05 DIAGNOSIS — E785 Hyperlipidemia, unspecified: Secondary | ICD-10-CM | POA: Insufficient documentation

## 2012-07-05 DIAGNOSIS — Z8619 Personal history of other infectious and parasitic diseases: Secondary | ICD-10-CM | POA: Insufficient documentation

## 2012-07-05 DIAGNOSIS — K219 Gastro-esophageal reflux disease without esophagitis: Secondary | ICD-10-CM | POA: Insufficient documentation

## 2012-07-05 DIAGNOSIS — IMO0002 Reserved for concepts with insufficient information to code with codable children: Secondary | ICD-10-CM

## 2012-07-05 DIAGNOSIS — I10 Essential (primary) hypertension: Secondary | ICD-10-CM | POA: Insufficient documentation

## 2012-07-05 DIAGNOSIS — I2510 Atherosclerotic heart disease of native coronary artery without angina pectoris: Secondary | ICD-10-CM | POA: Insufficient documentation
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
## 2012-07-06 ENCOUNTER — Emergency Department (HOSPITAL_COMMUNITY): Payer: 59

## 2012-07-06 MED ORDER — BACITRACIN ZINC 500 UNIT/GM EX OINT
TOPICAL_OINTMENT | CUTANEOUS | Status: AC
  Administered 2012-07-06: 2 via TOPICAL
  Filled 2012-07-06: qty 1.8

## 2012-07-06 MED ORDER — LIDOCAINE-EPINEPHRINE (PF) 2 %-1:200000 IJ SOLN
INTRAMUSCULAR | Status: AC
  Filled 2012-07-06: qty 20

## 2012-07-06 NOTE — ED Notes (Signed)
Ambulated to bathroom without need of assistance.
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
## 2012-07-06 NOTE — ED Notes (Signed)
Tight compression dressing applied to forehead to assist with control of bleeding.  Face cleaned as much as possible, hands washed, chest washed, blood in many areas on body with no other lacerations identified.  Multiple facial bruises noted, blood inside nose / mouth.

## 2012-07-06 NOTE — ED Notes (Addendum)
Placed 4x4 gauze and abd pad above patient's right eye. Patient holding pressure at this time.

## 2012-07-06 NOTE — ED Notes (Signed)
MD repairing laceration.  Talking with patient.  Patient is refusing CT scan of head.  In presence of this RN, sister in law Riyad Keena, Macon Sandiford nephew patient is alert and oriented and declines to have CT scan.  8 external sutures 2 internal sutures

## 2012-07-06 NOTE — ED Notes (Signed)
Patient continues to refuse CT head.  Patient signed out AMA for further treatment/evaluation.  Patient ambulatory; family accompanied patient home.

## 2012-07-06 NOTE — ED Notes (Signed)
Bleeding controlled after sutures placed.  Cleaned as well as possible.

## 2012-12-19 ENCOUNTER — Other Ambulatory Visit: Payer: Self-pay

## 2012-12-19 ENCOUNTER — Other Ambulatory Visit: Payer: Self-pay | Admitting: Cardiovascular Disease

## 2012-12-19 MED ORDER — OMEPRAZOLE 40 MG PO CPDR: 40.0000 mg | DELAYED_RELEASE_CAPSULE | Freq: Every day | ORAL | Status: DC

## 2013-01-07 ENCOUNTER — Other Ambulatory Visit: Payer: Self-pay | Admitting: *Deleted

## 2013-01-07 NOTE — Telephone Encounter (Signed)
Patient last seen for chronic follow up on 07-01-12. Please advise

## 2013-01-08 MED ORDER — ATORVASTATIN CALCIUM 40 MG PO TABS: 40.0000 mg | ORAL_TABLET | Freq: Every day | ORAL | Status: DC

## 2013-01-08 MED ORDER — METOPROLOL SUCCINATE ER 50 MG PO TB24: 50.0000 mg | ORAL_TABLET | Freq: Every day | ORAL | Status: DC

## 2013-01-08 MED ORDER — VALACYCLOVIR HCL 500 MG PO TABS: 500.0000 mg | ORAL_TABLET | Freq: Every day | ORAL | Status: DC

## 2013-01-08 NOTE — Telephone Encounter (Signed)
Needs office visit. Last refill.

## 2013-01-19 ENCOUNTER — Ambulatory Visit (INDEPENDENT_AMBULATORY_CARE_PROVIDER_SITE_OTHER): Payer: 59 | Admitting: Family Medicine

## 2013-01-19 ENCOUNTER — Encounter: Payer: Self-pay | Admitting: Family Medicine

## 2013-01-19 VITALS — BP 137/88 | HR 60 | Temp 98.0°F | Ht 69.4 in | Wt 241.4 lb

## 2013-01-19 DIAGNOSIS — N476 Balanoposthitis: Secondary | ICD-10-CM

## 2013-01-19 DIAGNOSIS — N481 Balanitis: Secondary | ICD-10-CM

## 2013-01-19 DIAGNOSIS — I2510 Atherosclerotic heart disease of native coronary artery without angina pectoris: Secondary | ICD-10-CM

## 2013-01-19 DIAGNOSIS — R0989 Other specified symptoms and signs involving the circulatory and respiratory systems: Secondary | ICD-10-CM

## 2013-01-19 DIAGNOSIS — I10 Essential (primary) hypertension: Secondary | ICD-10-CM

## 2013-01-19 DIAGNOSIS — K219 Gastro-esophageal reflux disease without esophagitis: Secondary | ICD-10-CM

## 2013-01-19 DIAGNOSIS — Z125 Encounter for screening for malignant neoplasm of prostate: Secondary | ICD-10-CM

## 2013-01-19 DIAGNOSIS — R0683 Snoring: Secondary | ICD-10-CM

## 2013-01-19 DIAGNOSIS — E785 Hyperlipidemia, unspecified: Secondary | ICD-10-CM

## 2013-01-19 DIAGNOSIS — R0609 Other forms of dyspnea: Secondary | ICD-10-CM

## 2013-01-19 DIAGNOSIS — B009 Herpesviral infection, unspecified: Secondary | ICD-10-CM

## 2013-01-19 DIAGNOSIS — E119 Type 2 diabetes mellitus without complications: Secondary | ICD-10-CM

## 2013-01-19 LAB — COMPLETE METABOLIC PANEL WITH GFR
Albumin: 3.8 g/dL (ref 3.5–5.2)
Alkaline Phosphatase: 93 U/L (ref 39–117)
BUN: 18 mg/dL (ref 6–23)
CO2: 24 mEq/L (ref 19–32)
Calcium: 8.8 mg/dL (ref 8.4–10.5)
Chloride: 107 mEq/L (ref 96–112)
GFR, Est Non African American: >89 mL/min
Glucose, Bld: 101 mg/dL — ABNORMAL HIGH (ref 70–99)
Potassium: 4.3 mEq/L (ref 3.5–5.3)
Sodium: 139 mEq/L (ref 135–145)
Total Protein: 6.2 g/dL (ref 6.0–8.3)

## 2013-01-19 MED ORDER — LISINOPRIL 5 MG PO TABS: 5.0000 mg | ORAL_TABLET | Freq: Every day | ORAL | Status: DC

## 2013-01-19 MED ORDER — NYSTATIN 100000 UNIT/GM EX CREA: TOPICAL_CREAM | Freq: Two times a day (BID) | CUTANEOUS | Status: DC

## 2013-01-19 MED ORDER — OMEPRAZOLE 40 MG PO CPDR: 40.0000 mg | DELAYED_RELEASE_CAPSULE | Freq: Every day | ORAL | Status: DC

## 2013-01-19 MED ORDER — VALACYCLOVIR HCL 500 MG PO TABS: 500.0000 mg | ORAL_TABLET | Freq: Every day | ORAL | Status: DC

## 2013-01-19 MED ORDER — PRASUGREL HCL 10 MG PO TABS: 10.0000 mg | ORAL_TABLET | Freq: Every day | ORAL | Status: DC

## 2013-01-19 MED ORDER — METOPROLOL SUCCINATE ER 50 MG PO TB24: 50.0000 mg | ORAL_TABLET | Freq: Every day | ORAL | Status: DC

## 2013-01-19 MED ORDER — FLUCONAZOLE 150 MG PO TABS: ORAL_TABLET | ORAL | Status: DC

## 2013-01-19 MED ORDER — NIACIN ER (ANTIHYPERLIPIDEMIC) 1000 MG PO TBCR: 1000.0000 mg | EXTENDED_RELEASE_TABLET | Freq: Every day | ORAL | Status: DC

## 2013-01-19 MED ORDER — ATORVASTATIN CALCIUM 40 MG PO TABS: 40.0000 mg | ORAL_TABLET | Freq: Every day | ORAL | Status: DC

## 2013-01-19 NOTE — Progress Notes (Signed)
  Subjective:    Patient ID: [name], male    DOB: [birth year], 56 y.o.   MRN: [id number]  HPI This 57 y.o. male presents for evaluation of diabetes, cad, hyperlipidemia and new c/o penile rash. He is uncircumcised and has been having discomfort and rash on his foreskin and has been having difficulty retracting his foreskin.  He also has been having complaints of excessive snoring that his sleep partner has c/o and has hypersomnolence.   Review of Systems  Constitutional: Positive for fatigue. Negative for fever and chills.  HENT: Negative for sore throat and neck pain.   Respiratory: Negative for cough, chest tightness and shortness of breath.   Cardiovascular: Negative for chest pain.  Gastrointestinal: Negative for abdominal pain, diarrhea and constipation.  Genitourinary: Negative for dysuria and frequency.  Musculoskeletal: Negative for arthralgias.  Neurological: Negative for syncope and headaches.  Psychiatric/Behavioral: Negative for agitation.       Objective:   Physical Exam  Vital signs noted  Well developed well nourished male.  HEENT - Head atraumatic Normocephalic                Eyes - PERRLA, Conjuctiva - clear Sclera- Clear EOMI                Ears - EAC's Wnl TM's Wnl Gross Hearing WNL                Nose - Nares patent                 Throat - oropharanx wnl Respiratory - Lungs CTA bilateral Cardiac - RRR S1 and S2 without murmur GI - Abdomen soft Nontender and bowel sounds active x 4 GU - uncircumcised with fissures in foreskin and erythematous and with white DC. Extremities - No edema. Neuro - Grossly intact.      Assessment & Plan:  Diabetes - Plan: POCT glycosylated hemoglobin (Hb A1C), lisinopril (PRINIVIL,ZESTRIL) 5 MG tablet  Other and unspecified hyperlipidemia - Plan: NMR Lipoprofile with Lipids, niacin (NIASPAN) 1000 MG CR tablet, atorvastatin (LIPITOR) 40 MG tablet  HTN (hypertension) - Plan: COMPLETE METABOLIC PANEL WITH GFR  HSV-1  (herpes simplex virus 1) infection - Plan: valACYclovir (VALTREX) 500 MG tablet  CAD (coronary artery disease) - Plan: prasugrel (EFFIENT) 10 MG TABS, metoprolol succinate (TOPROL-XL) 50 MG 24 hr tablet  GERD (gastroesophageal reflux disease) - Plan: omeprazole (PRILOSEC) 40 MG capsule  Snoring - Plan: Nocturnal polysomnography (NPSG)  Screening for prostate cancer - Plan: PSA  Balanitis - Plan: fluconazole (DIFLUCAN) 150 MG tablet, nystatin cream (MYCOSTATIN)

## 2013-01-19 NOTE — Patient Instructions (Signed)
Low fat diet, continue to exercise and lose weight.   Balanitis Balanitis is an common infection of the head (glans) of the penis. CAUSES  Balanitis has multiple causes. Frequently balanitis is the result of poor personal hygiene. Especially if no circumcision has been done. Without adequate washing, many different kinds of germs (viruses, bacteria, and yeast) collect between the foreskin and the glans. This can cause an infection. Lack of air and irritation from a normal secretion called smegma contribute to the cause in uncircumcised males. Other causes include chemical irritation by certain soaps (especially soaps with perfumes). When no circumcision has been done, a frequent cause of poor hygiene is that the tip of the foreskin is tight (phimosis) and cannot be pulled back for adequate washing. Illnesses in other areas of the body can also cause balanitis. This includes illnesses that cause water retention and swelling, such as:  Heart failure.  Cirrhosis of the liver.  Kidney problems. Other contributing causes include:  Obesity.  Certain allergies to drugs such as tetracycline and sulfa.  Diabetes. SYMPTOMS  Symptoms may include:  Discharge coming from under the foreskin.  Tenderness.  Itching and inability to get an erection (because of the pain).  Redness and a rash is frequently seen.  If the problem remains for a while, sores can be seen on the glans and on the foreskin. If the condition is not treated other complications such as a scar of the opening to the urethra (tube that carries the urine out from the bladder) can occur and block the bladder. This narrowing is called meatal stenosis. Other problems can occur such as:  Infection of the lymph nodes in the crease of the groin.  Ballooning of the foreskin when voiding (when the foreskin opening has scarred down and been made smaller).  Blockage of the bladder.  Frequent urinary infections occur in children with  balanitis. HOME CARE INSTRUCTIONS   Pull back foreskin to urinate and when washing.  Pull back foreskin when putting medication on the affected area to prevent the foreskin from swelling and being trapped behind the head.  Keep foreskin and glans clean and dry.  Sitz baths may be helpful.  Take your medication as directed .  Pain medication, if needed.  Circumcision (may be recommended). SEEK IMMEDIATE MEDICAL CARE IF:   The affected area becomes trapped behind the head.  You start a fever.  The swelling increases. Document Released: 12/16/2008 Document Revised: 10/22/2011 Document Reviewed: 12/16/2008 [facility] Patient Information 2014 [place], [place].

## 2013-01-20 LAB — PSA: PSA: 0.49 ng/mL (ref <=4.00)

## 2013-01-20 LAB — NMR LIPOPROFILE WITH LIPIDS
HDL Size: 9.6 nm (ref >=9.2)
LDL Particle Number: 662 nmol/L (ref <1000)
Large HDL-P: 7.3 umol/L (ref >=4.8)
Large VLDL-P: 19.3 nmol/L — ABNORMAL HIGH (ref <=2.7)
Small LDL Particle Number: 509 nmol/L (ref <=527)
Triglycerides: 196 mg/dL — ABNORMAL HIGH (ref <150)
VLDL Size: 65.3 nm — ABNORMAL HIGH (ref <=46.6)

## 2013-03-22 ENCOUNTER — Other Ambulatory Visit: Payer: Self-pay | Admitting: Cardiovascular Disease

## 2013-03-23 ENCOUNTER — Ambulatory Visit (INDEPENDENT_AMBULATORY_CARE_PROVIDER_SITE_OTHER): Payer: 59 | Admitting: Family Medicine

## 2013-03-23 ENCOUNTER — Encounter: Payer: Self-pay | Admitting: Family Medicine

## 2013-03-23 VITALS — BP 113/71 | HR 63 | Temp 97.6°F | Ht 69.4 in | Wt 241.0 lb

## 2013-03-23 DIAGNOSIS — L255 Unspecified contact dermatitis due to plants, except food: Secondary | ICD-10-CM

## 2013-03-23 DIAGNOSIS — L237 Allergic contact dermatitis due to plants, except food: Secondary | ICD-10-CM

## 2013-03-23 MED ORDER — HYDROXYZINE HCL 25 MG PO TABS: 25.0000 mg | ORAL_TABLET | Freq: Three times a day (TID) | ORAL | Status: DC | PRN

## 2013-03-23 MED ORDER — METHYLPREDNISOLONE (PAK) 4 MG PO TABS: ORAL_TABLET | ORAL | Status: DC

## 2013-03-23 MED ORDER — DOXYCYCLINE HYCLATE 100 MG PO TABS: 100.0000 mg | ORAL_TABLET | Freq: Two times a day (BID) | ORAL | Status: DC

## 2013-03-23 MED ORDER — METHYLPREDNISOLONE ACETATE 40 MG/ML IJ SUSP
40.0000 mg | Freq: Once | INTRAMUSCULAR | Status: AC
  Administered 2013-03-23: 40 mg via INTRAMUSCULAR

## 2013-03-23 NOTE — Progress Notes (Signed)
  Subjective:    Patient ID: [name], male    DOB: Jul 15, 1956, 56 y.o.   MRN: [id number]  HPI  This 57 y.o. male presents for evaluation of rash and drainage right foot.  He has been having some Problems with rash and blisters on his bilateral legs and ankles for a week after working in the woods.  Review of Systems  C/o rash   No chest pain, SOB, HA, dizziness, vision change, N/V, diarrhea, constipation, dysuria, urinary urgency or frequency, myalgias, arthralgias.  Objective:   Physical Exam  Vital signs noted  Well developed well nourished male.  HEENT - Head atraumatic Normocephalic                Throat - oropharanx wnl Respiratory - Lungs CTA bilateral Cardiac - RRR S1 and S2 without murmur GI - Abdomen soft Nontender and bowel sounds active x 4 Extremities - Bilateral feet and ankles with blisters and rash.  Bilateral legs with scattered raised vesicular rash. Neuro - Grossly intact.     Assessment & Plan:  Poison oak dermatitis - Plan: methylPREDNIsolone (MEDROL DOSPACK) 4 MG tablet, doxycycline (VIBRA-TABS) 100 MG tablet, methylPREDNISolone acetate (DEPO-MEDROL) injection 40 mg, hydrOXYzine (ATARAX/VISTARIL) 25 MG tablet Discussed with patient he can do aveeno baths and if not better then follow up .

## 2013-03-23 NOTE — Patient Instructions (Signed)
Poison Oak Poison oak is an inflammation of the skin (contact dermatitis). It is caused by contact with the allergens on the leaves of the oak (toxicodendron) plants. Depending on your sensitivity, the rash may consist simply of redness and itching, or it may also progress to blisters which may break open (rupture). These must be well cared for to prevent secondary germ (bacterial) infection as these infections can lead to scarring. The eyes may also get puffy. The puffiness is worst in the morning and gets better as the day progresses. Healing is best accomplished by keeping any open areas dry, clean, covered with a bandage, and covered with an antibacterial ointment if needed. Without secondary infection, this dermatitis usually heals without scarring within 2 to 3 weeks without treatment. HOME CARE INSTRUCTIONS When you have been exposed to poison oak, it is very important to thoroughly wash with soap and water as soon as the exposure has been discovered. You have about one half hour to remove the plant resin before it will cause the rash. This cleaning will quickly destroy the oil or antigen on the skin (the antigen is what causes the rash). Wash aggressively under the fingernails as any plant resin still there will continue to spread the rash. Do not rub skin vigorously when washing affected area. Poison oak cannot spread if no oil from the plant remains on your body. Rash that has progressed to weeping sores (lesions) will not spread the rash unless you have not washed thoroughly. It is also important to clean any clothes you have been wearing as they may carry active allergens which will spread the rash, even several days later. Avoidance of the plant in the future is the best measure. Poison oak plants can be recognized by the number of leaves. Generally, poison oak has three leaves with flowering branches on a single stem. Diphenhydramine may be purchased over the counter and used as needed for  itching. Do not drive with this medication if it makes you drowsy. Ask your caregiver about medication for children. SEEK IMMEDIATE MEDICAL CARE IF:   Open areas of the rash develop.  You notice redness extending beyond the area of the rash.  There is a pus like discharge.  There is increased pain.  Other signs of infection develop (such as fever). Document Released: 02/03/2003 Document Revised: 10/22/2011 Document Reviewed: 06/15/2009 [employer] Patient Information 2014 [employer].  

## 2013-04-20 ENCOUNTER — Other Ambulatory Visit: Payer: Self-pay | Admitting: Cardiovascular Disease

## 2013-04-21 ENCOUNTER — Ambulatory Visit: Payer: 59 | Admitting: Family Medicine

## 2013-04-22 ENCOUNTER — Ambulatory Visit (INDEPENDENT_AMBULATORY_CARE_PROVIDER_SITE_OTHER): Payer: 59 | Admitting: Family Medicine

## 2013-04-22 ENCOUNTER — Encounter: Payer: Self-pay | Admitting: Family Medicine

## 2013-04-22 VITALS — BP 124/70 | HR 63 | Resp 16 | Wt 239.2 lb

## 2013-04-22 DIAGNOSIS — I2510 Atherosclerotic heart disease of native coronary artery without angina pectoris: Secondary | ICD-10-CM

## 2013-04-22 DIAGNOSIS — N481 Balanitis: Secondary | ICD-10-CM

## 2013-04-22 DIAGNOSIS — N476 Balanoposthitis: Secondary | ICD-10-CM

## 2013-04-22 MED ORDER — FLUCONAZOLE 150 MG PO TABS: 150.0000 mg | ORAL_TABLET | Freq: Once | ORAL | Status: DC

## 2013-04-22 MED ORDER — NYSTATIN 100000 UNIT/GM EX CREA: TOPICAL_CREAM | Freq: Two times a day (BID) | CUTANEOUS | Status: DC

## 2013-04-22 NOTE — Patient Instructions (Signed)
Balanitis Balanitis is an common infection of the head (glans) of the penis. CAUSES  Balanitis has multiple causes. Frequently balanitis is the result of poor personal hygiene. Especially if no circumcision has been done. Without adequate washing, many different kinds of germs (viruses, bacteria, and yeast) collect between the foreskin and the glans. This can cause an infection. Lack of air and irritation from a normal secretion called smegma contribute to the cause in uncircumcised males. Other causes include chemical irritation by certain soaps (especially soaps with perfumes). When no circumcision has been done, a frequent cause of poor hygiene is that the tip of the foreskin is tight (phimosis) and cannot be pulled back for adequate washing. Illnesses in other areas of the body can also cause balanitis. This includes illnesses that cause water retention and swelling, such as:  Heart failure.  Cirrhosis of the liver.  Kidney problems. Other contributing causes include:  Obesity.  Certain allergies to drugs such as tetracycline and sulfa.  Diabetes. SYMPTOMS  Symptoms may include:  Discharge coming from under the foreskin.  Tenderness.  Itching and inability to get an erection (because of the pain).  Redness and a rash is frequently seen.  If the problem remains for a while, sores can be seen on the glans and on the foreskin. If the condition is not treated other complications such as a scar of the opening to the urethra (tube that carries the urine out from the bladder) can occur and block the bladder. This narrowing is called meatal stenosis. Other problems can occur such as:  Infection of the lymph nodes in the crease of the groin.  Ballooning of the foreskin when voiding (when the foreskin opening has scarred down and been made smaller).  Blockage of the bladder.  Frequent urinary infections occur in children with balanitis. HOME CARE INSTRUCTIONS   Pull back foreskin  to urinate and when washing.  Pull back foreskin when putting medication on the affected area to prevent the foreskin from swelling and being trapped behind the head.  Keep foreskin and glans clean and dry.  Sitz baths may be helpful.  Take your medication as directed .  Pain medication, if needed.  Circumcision (may be recommended). SEEK IMMEDIATE MEDICAL CARE IF:   The affected area becomes trapped behind the head.  You start a fever.  The swelling increases. Document Released: 12/16/2008 Document Revised: 10/22/2011 Document Reviewed: 12/16/2008 [employer] Patient Information 2014 [employer].  

## 2013-04-22 NOTE — Progress Notes (Signed)
  Subjective:    Patient ID: [name], male    DOB: 05/10/1956, 57 y.o.   MRN: [id number]  HPI  This 57 y.o. male presents for evaluation of routine follow up.  He has hx of CAD and coronary artery Stent and has seen Dr. Lanier Clam Cardiology and he hasn't seen him in over a year.  He has Balanitis and is doing better but still has some rash and cracking on his foreskin.  Review of Systems C/o balanitis   No chest pain, SOB, HA, dizziness, vision change, N/V, diarrhea, constipation, dysuria, urinary urgency or frequency, myalgias, arthralgias or rash.  Objective:   Physical Exam Vital signs noted  Well developed well nourished male.  HEENT - Head atraumatic Normocephalic                Eyes - PERRLA, Conjuctiva - clear Sclera- Clear EOMI                Ears - EAC's Wnl TM's Wnl Gross Hearing WNL                Nose - Nares patent                 Throat - oropharanx wnl Respiratory - Lungs CTA bilateral Cardiac - RRR S1 and S2 without murmur GI - Abdomen soft Nontender and bowel sounds active x 4 Extremities - No edema. Neuro - Grossly intact.       Assessment & Plan:  CAD (coronary artery disease) - Plan: Ambulatory referral to Cardiology  Balanitis - Plan: nystatin cream (MYCOSTATIN), fluconazole (DIFLUCAN) 150 MG tablet

## 2013-05-11 ENCOUNTER — Other Ambulatory Visit: Payer: Self-pay | Admitting: Family Medicine

## 2013-05-17 ENCOUNTER — Other Ambulatory Visit: Payer: Self-pay | Admitting: Family Medicine

## 2013-05-19 NOTE — Telephone Encounter (Signed)
Last seen 04/22/13  B [place]

## 2013-06-17 ENCOUNTER — Ambulatory Visit (INDEPENDENT_AMBULATORY_CARE_PROVIDER_SITE_OTHER): Payer: 59 | Admitting: Cardiology

## 2013-06-17 ENCOUNTER — Encounter: Payer: Self-pay | Admitting: Cardiology

## 2013-06-17 VITALS — BP 128/77 | HR 69 | Ht 70.0 in | Wt 240.0 lb

## 2013-06-17 DIAGNOSIS — I2510 Atherosclerotic heart disease of native coronary artery without angina pectoris: Secondary | ICD-10-CM

## 2013-06-17 DIAGNOSIS — I10 Essential (primary) hypertension: Secondary | ICD-10-CM

## 2013-06-17 NOTE — Patient Instructions (Signed)
The current medical regimen is effective;  continue present plan and medications.  Follow up in 18 months with Dr Hochrein.  You will receive a letter in the mail 2 months before you are due.  Please call us when you receive this letter to schedule your follow up appointment.  
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
## 2013-07-20 ENCOUNTER — Other Ambulatory Visit: Payer: Self-pay | Admitting: Cardiology

## 2013-08-14 ENCOUNTER — Other Ambulatory Visit: Payer: Self-pay | Admitting: Family Medicine

## 2013-08-18 ENCOUNTER — Other Ambulatory Visit: Payer: Self-pay | Admitting: Family Medicine

## 2013-08-19 ENCOUNTER — Other Ambulatory Visit: Payer: Self-pay | Admitting: Family Medicine

## 2013-08-26 ENCOUNTER — Other Ambulatory Visit: Payer: Self-pay | Admitting: Family Medicine

## 2013-08-28 NOTE — Telephone Encounter (Signed)
No labs since 01/19/13

## 2013-09-23 ENCOUNTER — Other Ambulatory Visit (INDEPENDENT_AMBULATORY_CARE_PROVIDER_SITE_OTHER): Payer: 59

## 2013-09-23 ENCOUNTER — Other Ambulatory Visit: Payer: Self-pay | Admitting: Family Medicine

## 2013-09-23 DIAGNOSIS — M791 Myalgia, unspecified site: Secondary | ICD-10-CM

## 2013-09-23 DIAGNOSIS — IMO0001 Reserved for inherently not codable concepts without codable children: Secondary | ICD-10-CM

## 2013-09-23 NOTE — Progress Notes (Signed)
Patient came in for labs only.

## 2013-09-24 LAB — BMP8+EGFR
BUN/Creatinine Ratio: 29 — ABNORMAL HIGH (ref 9–20)
BUN: 21 mg/dL (ref 6–24)
CO2: 20 mmol/L (ref 18–29)
Calcium: 9.2 mg/dL (ref 8.7–10.2)
Chloride: 104 mmol/L (ref 97–108)
Creatinine, Ser: 0.72 mg/dL — ABNORMAL LOW (ref 0.76–1.27)
GFR calc Af Amer: 121 mL/min/{1.73_m2} (ref >59)
GFR calc non Af Amer: 104 mL/min/{1.73_m2} (ref >59)
Glucose: 83 mg/dL (ref 65–99)
Potassium: 4.2 mmol/L (ref 3.5–5.2)
Sodium: 141 mmol/L (ref 134–144)

## 2013-11-14 ENCOUNTER — Other Ambulatory Visit: Payer: Self-pay | Admitting: Family Medicine

## 2013-11-26 ENCOUNTER — Other Ambulatory Visit: Payer: Self-pay | Admitting: Family Medicine

## 2013-12-07 ENCOUNTER — Telehealth: Payer: Self-pay | Admitting: Family Medicine

## 2013-12-07 NOTE — Telephone Encounter (Signed)
appt given for 5/11 with [facility]

## 2013-12-21 ENCOUNTER — Encounter: Payer: Self-pay | Admitting: Family Medicine

## 2013-12-21 ENCOUNTER — Other Ambulatory Visit: Payer: Self-pay | Admitting: Family Medicine

## 2013-12-21 ENCOUNTER — Ambulatory Visit (INDEPENDENT_AMBULATORY_CARE_PROVIDER_SITE_OTHER): Payer: 59 | Admitting: Family Medicine

## 2013-12-21 VITALS — BP 108/67 | HR 60 | Temp 97.0°F | Ht 69.25 in | Wt 244.0 lb

## 2013-12-21 DIAGNOSIS — E559 Vitamin D deficiency, unspecified: Secondary | ICD-10-CM

## 2013-12-21 DIAGNOSIS — I2510 Atherosclerotic heart disease of native coronary artery without angina pectoris: Secondary | ICD-10-CM

## 2013-12-21 DIAGNOSIS — E119 Type 2 diabetes mellitus without complications: Secondary | ICD-10-CM

## 2013-12-21 DIAGNOSIS — I10 Essential (primary) hypertension: Secondary | ICD-10-CM

## 2013-12-21 DIAGNOSIS — K219 Gastro-esophageal reflux disease without esophagitis: Secondary | ICD-10-CM

## 2013-12-21 DIAGNOSIS — E785 Hyperlipidemia, unspecified: Secondary | ICD-10-CM

## 2013-12-21 DIAGNOSIS — Z0000 Encounter for general adult medical examination without abnormal findings: Secondary | ICD-10-CM

## 2013-12-21 DIAGNOSIS — A6000 Herpesviral infection of urogenital system, unspecified: Secondary | ICD-10-CM

## 2013-12-21 LAB — POCT CBC
Granulocyte percent: 47.4 %G (ref 37–80)
HCT, POC: 52 % (ref 43.5–53.7)
Hemoglobin: 16.4 g/dL (ref 14.1–18.1)
Lymph, poc: 4.7 — AB (ref 0.6–3.4)
MCH, POC: 30.7 pg (ref 27–31.2)
MCHC: 31.5 g/dL — AB (ref 31.8–35.4)
MCV: 97.4 fL — AB (ref 80–97)
MPV: 8.1 fL (ref 0–99.8)
POC Granulocyte: 4.9 (ref 2–6.9)
POC LYMPH PERCENT: 45.2 %L (ref 10–50)
Platelet Count, POC: 261 10*3/uL (ref 142–424)
RBC: 5.3 M/uL (ref 4.69–6.13)
RDW, POC: 14.7 %
WBC: 10.3 10*3/uL — AB (ref 4.6–10.2)

## 2013-12-21 MED ORDER — LISINOPRIL 5 MG PO TABS: 5.0000 mg | ORAL_TABLET | Freq: Every day | ORAL | Status: DC

## 2013-12-21 MED ORDER — NIACIN ER (ANTIHYPERLIPIDEMIC) 1000 MG PO TBCR: EXTENDED_RELEASE_TABLET | ORAL | Status: DC

## 2013-12-21 MED ORDER — VALACYCLOVIR HCL 500 MG PO TABS: ORAL_TABLET | ORAL | Status: DC

## 2013-12-21 MED ORDER — OMEPRAZOLE 40 MG PO CPDR: DELAYED_RELEASE_CAPSULE | ORAL | Status: DC

## 2013-12-21 MED ORDER — METOPROLOL SUCCINATE ER 50 MG PO TB24: 50.0000 mg | ORAL_TABLET | Freq: Every day | ORAL | Status: DC

## 2013-12-21 MED ORDER — NITROGLYCERIN 0.4 MG SL SUBL: 0.4000 mg | SUBLINGUAL_TABLET | SUBLINGUAL | Status: DC | PRN

## 2013-12-21 MED ORDER — ATORVASTATIN CALCIUM 40 MG PO TABS: ORAL_TABLET | ORAL | Status: DC

## 2013-12-21 NOTE — Progress Notes (Signed)
Subjective:    Patient ID: [name], male    DOB: 09/08/1955, 58 y.o.   MRN: [id number]  HPI  This 58 y.o. male presents for evaluation of wellness visit and routine visit.  He has hx of CAD, Hypertension, GERD, tobacco abuse, and hyperlipidemia.  He is due for CPE labs and he needs refills.  Review of Systems No chest pain, SOB, HA, dizziness, vision change, N/V, diarrhea, constipation, dysuria, urinary urgency or frequency, myalgias, arthralgias or rash.     Objective:   Physical Exam  Vital signs noted  Well developed well nourished male.  HEENT - Head atraumatic Normocephalic                Eyes - PERRLA, Conjuctiva - clear Sclera- Clear EOMI                Ears - EAC's Wnl TM's Wnl Gross Hearing WNL                Nose - Nares patent                 Throat - oropharanx wnl Respiratory - Lungs CTA bilateral Cardiac - RRR S1 and S2 without murmur GI - Abdomen soft Nontender and bowel sounds active x 4 Extremities - No edema. Neuro - Grossly intact.      Assessment & Plan:  Hypertension - Plan: POCT CBC, CMP14+EGFR  Vitamin D deficiency - Plan: Vit D  25 hydroxy (rtn osteoporosis monitoring)  Hyperlipemia - Plan: CMP14+EGFR, Lipid panel, niacin (NIASPAN) 1000 MG CR tablet  Diabetes - Plan: lisinopril (PRINIVIL,ZESTRIL) 5 MG tablet  Genital HSV - Plan: valACYclovir (VALTREX) 500 MG tablet  GERD (gastroesophageal reflux disease) - Plan: omeprazole (PRILOSEC) 40 MG capsule  CAD (coronary artery disease) - Plan: metoprolol succinate (TOPROL-XL) 50 MG 24 hr tablet, nitroGLYCERIN (NITROSTAT) 0.4 MG SL tablet  Routine general medical examination at a health care facility - Plan: PSA, total and free  Lysbeth Penner FNP

## 2013-12-22 LAB — CMP14+EGFR
ALT: 23 IU/L (ref 0–44)
AST: 20 IU/L (ref 0–40)
Albumin/Globulin Ratio: 1.7 (ref 1.1–2.5)
Albumin: 4 g/dL (ref 3.5–5.5)
Alkaline Phosphatase: 83 IU/L (ref 39–117)
BUN/Creatinine Ratio: 27 — ABNORMAL HIGH (ref 9–20)
BUN: 21 mg/dL (ref 6–24)
CO2: 21 mmol/L (ref 18–29)
Calcium: 9.2 mg/dL (ref 8.7–10.2)
Chloride: 106 mmol/L (ref 97–108)
Creatinine, Ser: 0.77 mg/dL (ref 0.76–1.27)
GFR calc Af Amer: 117 mL/min/{1.73_m2} (ref >59)
GFR calc non Af Amer: 101 mL/min/{1.73_m2} (ref >59)
Globulin, Total: 2.3 g/dL (ref 1.5–4.5)
Glucose: 107 mg/dL — ABNORMAL HIGH (ref 65–99)
Potassium: 4.5 mmol/L (ref 3.5–5.2)
Sodium: 140 mmol/L (ref 134–144)
Total Bilirubin: 0.3 mg/dL (ref 0.0–1.2)
Total Protein: 6.3 g/dL (ref 6.0–8.5)

## 2013-12-22 LAB — LIPID PANEL
Chol/HDL Ratio: 3.5 ratio units (ref 0.0–5.0)
Cholesterol, Total: 141 mg/dL (ref 100–199)
HDL: 40 mg/dL (ref >39)
LDL Calculated: 74 mg/dL (ref 0–99)
Triglycerides: 137 mg/dL (ref 0–149)
VLDL Cholesterol Cal: 27 mg/dL (ref 5–40)

## 2013-12-22 LAB — PSA, TOTAL AND FREE
PSA, Free Pct: 17.1 %
PSA, Free: 0.12 ng/mL
PSA: 0.7 ng/mL (ref 0.0–4.0)

## 2013-12-22 LAB — VITAMIN D 25 HYDROXY (VIT D DEFICIENCY, FRACTURES): Vit D, 25-Hydroxy: 17.7 ng/mL — ABNORMAL LOW (ref 30.0–100.0)

## 2013-12-23 ENCOUNTER — Telehealth: Payer: Self-pay | Admitting: Family Medicine

## 2013-12-23 ENCOUNTER — Other Ambulatory Visit: Payer: Self-pay | Admitting: Family Medicine

## 2013-12-23 MED ORDER — VITAMIN D (ERGOCALCIFEROL) 1.25 MG (50000 UNIT) PO CAPS: 50000.0000 [IU] | ORAL_CAPSULE | ORAL | Status: DC

## 2013-12-23 NOTE — Telephone Encounter (Signed)
Message copied by Azalee CourseFULP, Larosa Rhines on Wed Dec 23, 2013 10:14 AM ------      Message from: Deatra CanterXFORD, WILLIAM J      Created: Wed Dec 23, 2013  9:58 AM       Vit d sent to pharm and take otc ------

## 2013-12-24 NOTE — Telephone Encounter (Signed)
Patient aware.

## 2014-01-09 ENCOUNTER — Other Ambulatory Visit: Payer: Self-pay | Admitting: Family Medicine

## 2014-03-15 ENCOUNTER — Ambulatory Visit (INDEPENDENT_AMBULATORY_CARE_PROVIDER_SITE_OTHER): Payer: 59 | Admitting: Family Medicine

## 2014-03-15 ENCOUNTER — Encounter: Payer: Self-pay | Admitting: Family Medicine

## 2014-03-15 VITALS — BP 128/79 | HR 67 | Temp 97.9°F | Ht 69.25 in | Wt 247.6 lb

## 2014-03-15 DIAGNOSIS — L03032 Cellulitis of left toe: Secondary | ICD-10-CM

## 2014-03-15 DIAGNOSIS — L259 Unspecified contact dermatitis, unspecified cause: Secondary | ICD-10-CM

## 2014-03-15 DIAGNOSIS — L02619 Cutaneous abscess of unspecified foot: Secondary | ICD-10-CM

## 2014-03-15 DIAGNOSIS — L03039 Cellulitis of unspecified toe: Secondary | ICD-10-CM

## 2014-03-15 MED ORDER — METHYLPREDNISOLONE ACETATE 80 MG/ML IJ SUSP
80.0000 mg | Freq: Once | INTRAMUSCULAR | Status: AC
  Administered 2014-03-15: 80 mg via INTRAMUSCULAR

## 2014-03-15 MED ORDER — METHYLPREDNISOLONE (PAK) 4 MG PO TABS: ORAL_TABLET | ORAL | Status: DC

## 2014-03-15 MED ORDER — HYDROXYZINE HCL 25 MG PO TABS: 25.0000 mg | ORAL_TABLET | Freq: Three times a day (TID) | ORAL | Status: DC | PRN

## 2014-03-15 MED ORDER — DOXYCYCLINE HYCLATE 100 MG PO TABS: 100.0000 mg | ORAL_TABLET | Freq: Two times a day (BID) | ORAL | Status: DC

## 2014-03-15 NOTE — Progress Notes (Signed)
   Subjective:    Patient ID: [name], male    DOB: [birth year], 58 y.o.   MRN: [id number]  HPI This 58 y.o. male presents for evaluation of rash and burning on left ankle and foot.  He was out working and felt some bites on his left leg and ankle and now he has a rash.   Review of Systems C/o rash No chest pain, SOB, HA, dizziness, vision change, N/V, diarrhea, constipation, dysuria, urinary urgency or frequency, myalgias, arthralgias.     Objective:   Physical Exam  Vital signs noted  Well developed well nourished male.  HEENT - Head atraumatic Normocephalic Respiratory - Lungs CTA bilateral Cardiac - RRR S1 and S2 without murmur Skin - Bullous vesicles and blisters left leg and foot with erythema     Assessment & Plan:  Contact dermatitis - Plan: methylPREDNISolone acetate (DEPO-MEDROL) injection 80 mg, methylPREDNIsolone (MEDROL DOSPACK) 4 MG tablet, hydrOXYzine (ATARAX/VISTARIL) 25 MG tablet  Cellulitis of toe of left foot - Plan: doxycycline (VIBRA-TABS) 100 MG tablet Po bid x 10 days  Deatra CanterWilliam J Stanly Si FNP

## 2014-04-21 ENCOUNTER — Other Ambulatory Visit: Payer: Self-pay | Admitting: Family Medicine

## 2014-10-14 ENCOUNTER — Other Ambulatory Visit: Payer: Self-pay

## 2014-10-14 MED ORDER — PRASUGREL HCL 10 MG PO TABS: 10.0000 mg | ORAL_TABLET | Freq: Every day | ORAL | Status: DC

## 2014-11-01 ENCOUNTER — Telehealth: Payer: Self-pay | Admitting: Physician Assistant

## 2014-11-01 ENCOUNTER — Encounter: Payer: Self-pay | Admitting: Physician Assistant

## 2014-11-01 ENCOUNTER — Ambulatory Visit (INDEPENDENT_AMBULATORY_CARE_PROVIDER_SITE_OTHER): Payer: 59 | Admitting: Physician Assistant

## 2014-11-01 VITALS — BP 142/88 | HR 68 | Temp 97.0°F | Ht 69.0 in | Wt 246.0 lb

## 2014-11-01 DIAGNOSIS — R42 Dizziness and giddiness: Secondary | ICD-10-CM

## 2014-11-01 DIAGNOSIS — E559 Vitamin D deficiency, unspecified: Secondary | ICD-10-CM

## 2014-11-01 DIAGNOSIS — E782 Mixed hyperlipidemia: Secondary | ICD-10-CM | POA: Diagnosis not present

## 2014-11-01 LAB — POCT CBC
GRANULOCYTE PERCENT: 43.4 % (ref 37–80)
HEMATOCRIT: 52.6 % (ref 43.5–53.7)
HEMOGLOBIN: 16.1 g/dL (ref 14.1–18.1)
LYMPH, POC: 5 — AB (ref 0.6–3.4)
MCH, POC: 29.5 pg (ref 27–31.2)
MCHC: 30.5 g/dL — AB (ref 31.8–35.4)
MCV: 96.6 fL (ref 80–97)
MPV: 7.9 fL (ref 0–99.8)
POC Granulocyte: 4.6 (ref 2–6.9)
POC LYMPH %: 47.7 % (ref 10–50)
Platelet Count, POC: 275 10*3/uL (ref 142–424)
RBC: 5.45 M/uL (ref 4.69–6.13)
RDW, POC: 15.2 %
WBC: 10.5 10*3/uL — AB (ref 4.6–10.2)

## 2014-11-01 MED ORDER — MECLIZINE HCL 32 MG PO TABS
32.0000 mg | ORAL_TABLET | Freq: Three times a day (TID) | ORAL | Status: DC | PRN
Start: 2014-11-01 — End: 2014-12-29

## 2014-11-01 NOTE — Telephone Encounter (Signed)
Question from [facility] was resolved when I called back

## 2014-11-01 NOTE — Progress Notes (Signed)
   Subjective:    Patient ID: [name], male    DOB: 04-26-1956, 59 y.o.   MRN: [id number]  HPI 59 y/o male c/o dizziness x 2 weeks ago. Has occurred 3-5 days. Episodes are continuous and lasts for several hours. Occurs with movement ( sit to stand) and with sitting with no movement.     Review of Systems  Constitutional: Positive for diaphoresis (nocturnal episodes or with sleeping. ). Negative for fatigue.  HENT: Positive for sinus pressure (frontal and maxillary sinuses bilateral).   Eyes: Negative for photophobia, pain and visual disturbance.  Respiratory: Negative.   Cardiovascular: Negative for chest pain and palpitations.  Gastrointestinal: Positive for nausea (occasional post dizziness, eased with eating. ).  Neurological: Positive for dizziness, light-headedness and headaches (dull facial pain). Negative for syncope, speech difficulty, weakness and numbness.  Psychiatric/Behavioral: Negative for confusion and decreased concentration.       Objective:   Physical Exam  Constitutional: He is oriented to person, place, and time. He appears well-nourished. No distress.  Cardiovascular: Normal rate, regular rhythm, normal heart sounds and intact distal pulses.  Exam reveals no gallop, no friction rub and no decreased pulses.   No murmur heard. Pulses:      Carotid pulses are 0 on the right side, and 0 on the left side. Pulmonary/Chest: Effort normal and breath sounds normal. No respiratory distress. He has no wheezes.  Neurological: He is alert and oriented to person, place, and time. He has normal reflexes.  Skin: He is not diaphoretic.  Psychiatric: He has a normal mood and affect. His behavior is normal. Judgment and thought content normal.  Vitals reviewed.         Assessment & Plan:  1. Vertigo: Labs ordered to r/o underlying causes.(CBC, CMP, Vit B12, Vit D)  Patient follows up on a yearly basis with Cardiologist. Do not suspect etiology as CAD at this time. Rx  Meclizine TID until f/u in 2 weeks.   2. Hypercholesterolemia: Fasting lipid panel 3. History of Vitamin D deficiency: Lab Vit D.   Report to ER if s/s worsen prior to f/u

## 2014-11-02 LAB — LIPID PANEL
CHOL/HDL RATIO: 2.7 ratio (ref 0.0–5.0)
Cholesterol, Total: 138 mg/dL (ref 100–199)
HDL: 52 mg/dL (ref >39)
LDL Calculated: 67 mg/dL (ref 0–99)
TRIGLYCERIDES: 94 mg/dL (ref 0–149)
VLDL CHOLESTEROL CAL: 19 mg/dL (ref 5–40)

## 2014-11-02 LAB — BMP8+EGFR
BUN / CREAT RATIO: 29 — AB (ref 9–20)
BUN: 19 mg/dL (ref 6–24)
CO2: 19 mmol/L (ref 18–29)
CREATININE: 0.66 mg/dL — AB (ref 0.76–1.27)
Calcium: 9.2 mg/dL (ref 8.7–10.2)
Chloride: 106 mmol/L (ref 97–108)
GFR calc Af Amer: 124 mL/min/{1.73_m2} (ref >59)
GFR, EST NON AFRICAN AMERICAN: 107 mL/min/{1.73_m2} (ref >59)
Glucose: 100 mg/dL — ABNORMAL HIGH (ref 65–99)
Potassium: 4.2 mmol/L (ref 3.5–5.2)
SODIUM: 141 mmol/L (ref 134–144)

## 2014-11-02 LAB — VITAMIN D 25 HYDROXY (VIT D DEFICIENCY, FRACTURES): Vit D, 25-Hydroxy: 20.8 ng/mL — ABNORMAL LOW (ref 30.0–100.0)

## 2014-11-02 LAB — VITAMIN B12: Vitamin B-12: 277 pg/mL (ref 211–946)

## 2014-11-16 ENCOUNTER — Ambulatory Visit (INDEPENDENT_AMBULATORY_CARE_PROVIDER_SITE_OTHER): Payer: 59 | Admitting: Physician Assistant

## 2014-11-16 ENCOUNTER — Encounter: Payer: Self-pay | Admitting: *Deleted

## 2014-11-16 ENCOUNTER — Encounter: Payer: Self-pay | Admitting: Physician Assistant

## 2014-11-16 VITALS — BP 118/70 | HR 66 | Temp 96.5°F | Ht 69.0 in | Wt 245.0 lb

## 2014-11-16 DIAGNOSIS — R358 Other polyuria: Secondary | ICD-10-CM

## 2014-11-16 DIAGNOSIS — N3000 Acute cystitis without hematuria: Secondary | ICD-10-CM | POA: Diagnosis not present

## 2014-11-16 DIAGNOSIS — R3589 Other polyuria: Secondary | ICD-10-CM

## 2014-11-16 DIAGNOSIS — R42 Dizziness and giddiness: Secondary | ICD-10-CM

## 2014-11-16 DIAGNOSIS — R110 Nausea: Secondary | ICD-10-CM | POA: Diagnosis not present

## 2014-11-16 DIAGNOSIS — A499 Bacterial infection, unspecified: Secondary | ICD-10-CM | POA: Diagnosis not present

## 2014-11-16 DIAGNOSIS — L089 Local infection of the skin and subcutaneous tissue, unspecified: Secondary | ICD-10-CM

## 2014-11-16 DIAGNOSIS — B9689 Other specified bacterial agents as the cause of diseases classified elsewhere: Secondary | ICD-10-CM

## 2014-11-16 LAB — POCT URINALYSIS DIPSTICK
Glucose, UA: NEGATIVE
NITRITE UA: NEGATIVE
UROBILINOGEN UA: NEGATIVE
pH, UA: 5

## 2014-11-16 LAB — POCT UA - MICROSCOPIC ONLY
Casts, Ur, LPF, POC: NEGATIVE
Crystals, Ur, HPF, POC: NEGATIVE
Epithelial cells, urine per micros: NEGATIVE
Yeast, UA: NEGATIVE

## 2014-11-16 MED ORDER — DOXYCYCLINE HYCLATE 100 MG PO TABS: 100.0000 mg | ORAL_TABLET | Freq: Two times a day (BID) | ORAL | Status: DC

## 2014-11-16 NOTE — Progress Notes (Signed)
Letter mailed

## 2014-11-16 NOTE — Patient Instructions (Signed)
-   Take entire course of antibiotic as directed.  - Drink lots of non caffeined beverages - May take OTC Azo for pain relief during urination - If severe back pain or fever develops, follow up in clinic or report to the ED for further evaluation.   FOLLOW UP WITH CARDIOLOGIST PRIOR TO F/U IN CLINIC. IF SYMPTOMS OF DIZZINESS CONTINUE TO OCCUR PRIOR TO F/U WITH CARDIOLOGIST, GO DIRECTLY TO ER.   F/U IN 2 WEEKS IN CLINIC

## 2014-11-16 NOTE — Progress Notes (Signed)
   Subjective:    Patient ID: [name], male    DOB: 1956-01-19, 59 y.o.   MRN: [id number]  HPI 59 y/o male presents with c/o dizziness and nausea x 4 weeks. Cardiac hx of five MI's with stents in 2012. Last f/u was 2014. Was given meclizine at last visit and dx with vertigo, however medication has not helped. Episodes are not related to anything specific. Some days the episodes occur with sudden movements, however other days they are constant and no relation to activity. Denies any CP, numbness, tingling or new back pain.   No aggravating or relieving factors   Review of Systems  Constitutional: Positive for chills and diaphoresis.  HENT: Positive for congestion (mild ). Negative for ear pain, postnasal drip, rhinorrhea, sneezing and sore throat.   Eyes: Negative.   Respiratory: Negative.   Cardiovascular: Positive for leg swelling (chronic mild, trace ).  Gastrointestinal: Positive for nausea (on days that he is dizzy eased with eating ). Negative for vomiting and constipation.  Genitourinary: Positive for frequency. Negative for hematuria.  Musculoskeletal: Positive for back pain.  Skin:       Uncircumcised. Has problems with skin cracking  Neurological: Positive for dizziness. Negative for syncope and weakness.       Objective:   Physical Exam  Constitutional: He appears well-developed and well-nourished. No distress.  Eyes: Pupils are equal, round, and reactive to light.  Cardiovascular: Normal rate, regular rhythm and normal heart sounds.  Exam reveals no gallop and no friction rub.   No murmur heard. Pulmonary/Chest: Effort normal and breath sounds normal. No respiratory distress. He has no wheezes. He has no rales. He exhibits no tenderness.  Skin: He is not diaphoretic.  Cracks and erythema on skin on end of penis . Cultured for bacterial infection          Assessment & Plan:  1. Dizziness and giddiness  - EKG 12-Lead WNL  2. Nausea without vomiting  - POCT  urinalysis dipstick - POCT UA - Microscopic Only  3. Polyuria  - Aerobic culture  4. Acute cystitis without hematuria  - doxycycline (VIBRA-TABS) 100 MG tablet; Take 1 tablet (100 mg total) by mouth 2 (two) times daily.  Dispense: 20 tablet; Refill: 0  5. Bacterial skin infection  - doxycycline (VIBRA-TABS) 100 MG tablet; Take 1 tablet (100 mg total) by mouth 2 (two) times daily.  Dispense: 20 tablet; Refill: 0   F/U in 2 weeks

## 2014-11-17 NOTE — Telephone Encounter (Signed)
-----   Message from Chelsea Primusiffany A Gann, PA-C sent at 11/16/2014  3:47 PM EDT ----- Suspect uti, abx started. Culture ordered

## 2014-11-18 LAB — AEROBIC CULTURE

## 2014-11-25 ENCOUNTER — Other Ambulatory Visit: Payer: Self-pay | Admitting: Physician Assistant

## 2014-11-25 DIAGNOSIS — L089 Local infection of the skin and subcutaneous tissue, unspecified: Principal | ICD-10-CM

## 2014-11-25 DIAGNOSIS — B9689 Other specified bacterial agents as the cause of diseases classified elsewhere: Secondary | ICD-10-CM

## 2014-11-25 MED ORDER — AMOXICILLIN 875 MG PO TABS: 875.0000 mg | ORAL_TABLET | Freq: Two times a day (BID) | ORAL | Status: DC

## 2014-11-30 ENCOUNTER — Ambulatory Visit: Payer: 59 | Admitting: Physician Assistant

## 2014-12-14 ENCOUNTER — Encounter: Payer: Self-pay | Admitting: Physician Assistant

## 2014-12-14 ENCOUNTER — Ambulatory Visit (INDEPENDENT_AMBULATORY_CARE_PROVIDER_SITE_OTHER): Payer: 59 | Admitting: Physician Assistant

## 2014-12-14 VITALS — BP 131/83 | HR 68 | Temp 96.9°F | Ht 69.0 in | Wt 246.0 lb

## 2014-12-14 DIAGNOSIS — R358 Other polyuria: Secondary | ICD-10-CM

## 2014-12-14 DIAGNOSIS — N3080 Other cystitis without hematuria: Secondary | ICD-10-CM | POA: Diagnosis not present

## 2014-12-14 DIAGNOSIS — M545 Low back pain, unspecified: Secondary | ICD-10-CM

## 2014-12-14 DIAGNOSIS — Z955 Presence of coronary angioplasty implant and graft: Secondary | ICD-10-CM

## 2014-12-14 DIAGNOSIS — N3090 Cystitis, unspecified without hematuria: Secondary | ICD-10-CM

## 2014-12-14 DIAGNOSIS — Z872 Personal history of diseases of the skin and subcutaneous tissue: Secondary | ICD-10-CM | POA: Diagnosis not present

## 2014-12-14 DIAGNOSIS — R3589 Other polyuria: Secondary | ICD-10-CM

## 2014-12-14 LAB — POCT UA - MICROSCOPIC ONLY
BACTERIA, U MICROSCOPIC: NEGATIVE
CRYSTALS, UR, HPF, POC: NEGATIVE
Casts, Ur, LPF, POC: NEGATIVE
Mucus, UA: NEGATIVE
RBC, urine, microscopic: NEGATIVE
Yeast, UA: NEGATIVE

## 2014-12-14 LAB — POCT URINALYSIS DIPSTICK
BILIRUBIN UA: NEGATIVE
Glucose, UA: NEGATIVE
Ketones, UA: NEGATIVE
NITRITE UA: NEGATIVE
PH UA: 6.5
PROTEIN UA: NEGATIVE
RBC UA: NEGATIVE
Spec Grav, UA: 1.015
Urobilinogen, UA: NEGATIVE

## 2014-12-14 MED ORDER — CIPROFLOXACIN HCL 500 MG PO TABS: 500.0000 mg | ORAL_TABLET | Freq: Two times a day (BID) | ORAL | Status: DC

## 2014-12-14 MED ORDER — MELOXICAM 7.5 MG PO TABS: 7.5000 mg | ORAL_TABLET | Freq: Every day | ORAL | Status: DC

## 2014-12-14 NOTE — Progress Notes (Signed)
   Subjective:    Patient ID: [name], male    DOB: May 15, 1956, 59 y.o.   MRN: [id number]  HPI 59 y/o male presents for f/u of cystitis and bacterial skin infection of penis. He states that he is still having frequent urination at times. Last psa was 2015.     Review of Systems  Constitutional: Negative.   HENT: Negative.   Respiratory: Negative.   Gastrointestinal: Positive for nausea (occasional ).  Endocrine: Positive for polyuria (intermittent).  Genitourinary: Positive for frequency. Negative for dysuria, urgency, hematuria, flank pain, decreased urine volume and difficulty urinating.  Musculoskeletal: Positive for back pain (pain in low back, right side. Worse with sitting long amts of time).  Skin:       Lesion on penis has cleared but still there       Objective:   Physical Exam  Constitutional: He is oriented to person, place, and time.  Cardiovascular: Normal rate, regular rhythm and normal heart sounds.   No murmur heard. Pulmonary/Chest: Effort normal and breath sounds normal.  Genitourinary:  U/a positive for moderate leukocytes  Neurological: He is alert and oriented to person, place, and time. He has normal reflexes.  Skin:  Decreased erythema on shaft and tip of penis. However mild cracked with retraction of foreskin. Patient not circumcised. Bacterial culture taken.   Vitals reviewed.         Assessment & Plan:  1. Polyuria  - POCT UA - Microscopic Only - POCT urinalysis dipstick - PSA, total and free - Urine culture  2. H/O local infection of skin and subcutaneous tissue  - Aerobic culture. Will treat if needed once culture results are obtained. Advised patient to use Zeasorb powders on penis to prevent moisture and possible infection recurrence due to him not being circumcised. This may also be the cause of his recurrent cystitis.   3. Right-sided low back pain without sciatica  - meloxicam (MOBIC) 7.5 MG tablet; Take 1 tablet (7.5 mg  total) by mouth daily.  Dispense: 30 tablet; Refill: 0  4. Recurrent cystitis - may be due to patient not being circumcised. Will also discuss possibility of Chlamydia infection and do testing at follow up. PSA ordered today  - ciprofloxacin (CIPRO) 500 MG tablet; Take 1 tablet (500 mg total) by mouth 2 (two) times daily.  Dispense: 20 tablet; Refill: 0  5. History of cardiac stent placement  - Patient has exam with Cardiologist on January 19, 2015   Continue all meds Labs pending Health Maintenance reviewed Diet and exercise encouraged RTO 2weeks   Samiah Ricklefs A. Chauncey ReadingGann PA-C

## 2014-12-15 LAB — PSA, TOTAL AND FREE
PSA FREE PCT: 20 %
PSA FREE: 0.1 ng/mL
Prostate Specific Ag, Serum: 0.5 ng/mL (ref 0.0–4.0)

## 2014-12-15 LAB — URINE CULTURE: ORGANISM ID, BACTERIA: NO GROWTH

## 2014-12-16 LAB — AEROBIC CULTURE

## 2014-12-29 ENCOUNTER — Ambulatory Visit (INDEPENDENT_AMBULATORY_CARE_PROVIDER_SITE_OTHER): Payer: 59 | Admitting: Physician Assistant

## 2014-12-29 ENCOUNTER — Encounter: Payer: Self-pay | Admitting: Physician Assistant

## 2014-12-29 VITALS — BP 130/77 | HR 65 | Temp 97.1°F | Ht 69.0 in | Wt 252.0 lb

## 2014-12-29 DIAGNOSIS — L439 Lichen planus, unspecified: Secondary | ICD-10-CM | POA: Diagnosis not present

## 2014-12-29 DIAGNOSIS — R300 Dysuria: Secondary | ICD-10-CM

## 2014-12-29 DIAGNOSIS — W57XXXA Bitten or stung by nonvenomous insect and other nonvenomous arthropods, initial encounter: Secondary | ICD-10-CM | POA: Diagnosis not present

## 2014-12-29 DIAGNOSIS — T148 Other injury of unspecified body region: Secondary | ICD-10-CM

## 2014-12-29 DIAGNOSIS — B354 Tinea corporis: Secondary | ICD-10-CM | POA: Diagnosis not present

## 2014-12-29 MED ORDER — TERBINAFINE HCL 250 MG PO TABS: 250.0000 mg | ORAL_TABLET | Freq: Every day | ORAL | Status: DC

## 2014-12-29 MED ORDER — DOXYCYCLINE HYCLATE 100 MG PO TABS: 100.0000 mg | ORAL_TABLET | Freq: Two times a day (BID) | ORAL | Status: DC

## 2014-12-29 MED ORDER — HYDROCORTISONE 2.5 % EX CREA: TOPICAL_CREAM | Freq: Two times a day (BID) | CUTANEOUS | Status: DC

## 2014-12-29 NOTE — Progress Notes (Signed)
   Subjective:    Patient ID: [name], male    DOB: 06-03-1956, 59 y.o.   MRN: [id number]  HPI 59 y/o male presents for recheck of cystitis and skin infection of penis, dizziness. He is uncircumsized.   He states that his dizziness has improved.   Back pain improves when he takes mobic. Worse with sitting long amounts of time.   Urinary symptoms have improved with treatment of amoxicillin.   Area on penis with previously dx skin infection has improved but very dry with mild intermittet itch   Patient also states that he has had numerous ( 12) tick bites in the past two weeks. Associated itching       Review of Systems  Constitutional: Negative.   Musculoskeletal: Positive for back pain.  Skin:       Dry skin on penis where infection was treated. Mild itch occasionally        Objective:   Physical Exam  Skin:  Erythema, cracking on tip of skin over penis. Uncircumsized. Resembling lichen planus with possible fungal infection           Assessment & Plan:  1. Burning with urination  - GC/Chlamydia Probe Amp  2. Lichen planus  - hydrocortisone 2.5 % cream; Apply topically 2 (two) times daily. X 14 days. Then prn  Dispense: 30 g; Refill: 1 - Aerobic culture  3. Tinea corporis  - terbinafine (LAMISIL) 250 MG tablet; Take 1 tablet (250 mg total) by mouth daily.  Dispense: 14 tablet; Refill: 0  4. Tick bites  - doxycycline (VIBRA-TABS) 100 MG tablet; Take 1 tablet (100 mg total) by mouth 2 (two) times daily.  Dispense: 28 tablet; Refill: 0   Discussed with patient that he made need referral to general surgeon if he continues to have problems with the foreskin of his penis and may need to look into circumcision.    Continue all meds Labs pending Health Maintenance reviewed Diet and exercise encouraged RTO 1 month   Johnsie Moscoso A. Chauncey ReadingGann PA-C

## 2014-12-31 LAB — AEROBIC CULTURE

## 2015-01-01 LAB — GC/CHLAMYDIA PROBE AMP
CHLAMYDIA, DNA PROBE: NEGATIVE
NEISSERIA GONORRHOEAE BY PCR: NEGATIVE

## 2015-01-19 ENCOUNTER — Ambulatory Visit (INDEPENDENT_AMBULATORY_CARE_PROVIDER_SITE_OTHER): Payer: 59 | Admitting: Cardiology

## 2015-01-19 ENCOUNTER — Encounter: Payer: Self-pay | Admitting: Cardiology

## 2015-01-19 VITALS — BP 110/80 | HR 64 | Ht 70.0 in | Wt 252.0 lb

## 2015-01-19 DIAGNOSIS — I2510 Atherosclerotic heart disease of native coronary artery without angina pectoris: Secondary | ICD-10-CM | POA: Diagnosis not present

## 2015-01-19 MED ORDER — ASPIRIN 81 MG PO TABS: 81.0000 mg | ORAL_TABLET | Freq: Every day | ORAL | Status: AC

## 2015-01-19 NOTE — Patient Instructions (Signed)
Medication Instructions:  Please decrease your ASA to 81 mg a day. Continue all other medications as listed.  You have been [facility] to the [facility].  Follow-Up: Follow up in 1 year [place]  You will receive a letter in the mail 2 months before you are due.  Please call us when you receive this letter to schedule your follow up appointment.  Thank you for choosing [facility]!!
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
## 2015-02-04 ENCOUNTER — Encounter: Payer: Self-pay | Admitting: Physician Assistant

## 2015-02-04 ENCOUNTER — Ambulatory Visit (INDEPENDENT_AMBULATORY_CARE_PROVIDER_SITE_OTHER): Payer: 59 | Admitting: Physician Assistant

## 2015-02-04 VITALS — BP 115/74 | HR 63 | Temp 97.0°F | Ht 70.0 in | Wt 249.0 lb

## 2015-02-04 DIAGNOSIS — L089 Local infection of the skin and subcutaneous tissue, unspecified: Secondary | ICD-10-CM

## 2015-02-04 DIAGNOSIS — L439 Lichen planus, unspecified: Secondary | ICD-10-CM | POA: Diagnosis not present

## 2015-02-04 NOTE — Patient Instructions (Signed)
Continue to use hydrocortisone daily to affected area of penis Use Zeasorb powders in the morning or as often as needed on dry skin to prevent moisture accumulation  Use DOVE soap

## 2015-02-04 NOTE — Progress Notes (Signed)
   Subjective:    Patient ID: [name], male    DOB: 06-06-1956, 59 y.o.   MRN: [id number]  HPI 58 y/o male presents for follow up of skin infection on penis d/t not being circumcised and back pain. He has been using hydrocortisone 2.5% cream on his penis BID with relief. Has not been taking Mobic for his back but pain has resolved.     Review of Systems  Endocrine: Negative for polyuria.  Genitourinary: Negative for dysuria, urgency, frequency, hematuria and difficulty urinating.  Musculoskeletal: Negative for back pain.  Skin:       Skin on penis is improving, not as dry and doesn't appear to be infected.        Objective:   Physical Exam  Skin:  Erythematous skin on distal shaft of penis where skin folds from noncircumcision. Much improved from initial visit. No fissures or cracking          Assessment & Plan:  1. Skin infection -Will treat if needed once results are obtained from culture - Aerobic culture  2. Lichen planus - Continue to use hydrocortisone as needed up to BID - Dove soap - Zeasorb powders to prevent moisture accumulation   3. Back pain  - Resolved.    RTO prn   Dequavius Kuhner A. Chauncey Reading PA-C

## 2015-02-07 ENCOUNTER — Other Ambulatory Visit: Payer: Self-pay

## 2015-02-07 LAB — AEROBIC CULTURE

## 2015-02-09 ENCOUNTER — Ambulatory Visit (INDEPENDENT_AMBULATORY_CARE_PROVIDER_SITE_OTHER): Payer: 59 | Admitting: Pharmacist

## 2015-02-09 ENCOUNTER — Encounter: Payer: Self-pay | Admitting: Pharmacist

## 2015-02-09 VITALS — BP 120/72 | HR 67 | Ht 70.0 in | Wt 249.0 lb

## 2015-02-09 DIAGNOSIS — E785 Hyperlipidemia, unspecified: Secondary | ICD-10-CM | POA: Diagnosis not present

## 2015-02-09 DIAGNOSIS — E782 Mixed hyperlipidemia: Secondary | ICD-10-CM

## 2015-02-09 DIAGNOSIS — E1169 Type 2 diabetes mellitus with other specified complication: Secondary | ICD-10-CM | POA: Insufficient documentation

## 2015-02-09 NOTE — Patient Instructions (Signed)
Continue with exercise - try to work up to 30 minutes as able.   Stop Niaspan / niacin Continue atorvastatin  take 1 tablet daily.

## 2015-02-09 NOTE — Progress Notes (Signed)
Patient aware.

## 2015-02-09 NOTE — Progress Notes (Signed)
Subjective:    [name] is a 59 y.o. male here for follow up of dyslipidemia. The patient does not use medications that may worsen dyslipidemias (corticosteroids, progestins, anabolic steroids, diuretics, beta-blockers, amiodarone, cyclosporine, olanzapine). The patient exercises usually 3 days per week - stationary bike for 20 minutes.  Patient is currently taking atrovastatin 40mg  1 tablet daily;  Also taking Niaspan 1000mg  daily.  Patient c/o occassional aches and pain.   Feels like a pulled muscle around abdominal area.   The patient is known to have coexisting coronary artery disease.   The following portions of the patient's history were reviewed and updated as appropriate: allergies, current medications, past family history, past medical history, past social history, past surgical history and problem list.   Objective:   Lab Review Lab Results  Component Value Date   CHOL 138 11/01/2014   CHOL 141 12/21/2013   CHOL 114 01/19/2013   CHOL  09/04/2010    153        ATP III CLASSIFICATION:  <200     mg/dL   Desirable  [id number]  mg/dL   Borderline High  >=130>=240    mg/dL   High          HDL 52 11/01/2014   HDL 40 12/21/2013   HDL 41 01/19/2013   HDL 43 09/04/2010      Assessment:    Dyslipidemia as detailed above with CAD / CHD Target levels for LDL are: < 70 mg/dl ("very high" risk for CHD)   Myalgias - possibly related to combo of niaspan and statin.   Explained to the patient the respective contributions of genetics, diet, and exercise to lipid levels and the use of medication in severe cases which do not respond to lifestyle alteration. The patient's interest and motivation in making lifestyle changes seems fair.    Plan:     1. Dietary changes: Increase soluble fiber Reduce saturated fat, "trans" monounsaturated fatty acids, and cholesterol Discussed mediterrean Diet 2. Exercise changes:  try to increase physical activity from 20 minutes to 30 minutes  daily. 3. Other treatment: Weight reduction (would like to see a 10% weight decrease at least.  ) 4. Lipid-lowering medications: discontinue niaspan.  Continue atorvastatin 40mg  1 tablet daily  5. Follow up: 6 weeks.  Note: The majority of the visit was spent in counseling on the pathophysiology and treatment of dyslipidemias. The total face-to-face time was in excess of 25 minutes.    Henrene Pastorammy Makaia Rappa, PharmD, CPP

## 2015-02-15 ENCOUNTER — Other Ambulatory Visit: Payer: Self-pay

## 2015-02-15 MED ORDER — OMEPRAZOLE 40 MG PO CPDR: DELAYED_RELEASE_CAPSULE | ORAL | Status: DC

## 2015-03-17 ENCOUNTER — Other Ambulatory Visit: Payer: Self-pay

## 2015-03-17 DIAGNOSIS — A6000 Herpesviral infection of urogenital system, unspecified: Secondary | ICD-10-CM

## 2015-03-17 MED ORDER — VALACYCLOVIR HCL 500 MG PO TABS: ORAL_TABLET | ORAL | Status: DC

## 2015-03-17 MED ORDER — ATORVASTATIN CALCIUM 40 MG PO TABS: ORAL_TABLET | ORAL | Status: DC

## 2015-03-17 NOTE — Telephone Encounter (Signed)
Last seen 6/16  Tiffany   Last lipid 11/01/14  Patient requesting 90 day supply

## 2015-03-17 NOTE — Telephone Encounter (Signed)
Last seen 02/09/15  [name]  This med not on EPIC list

## 2015-03-18 ENCOUNTER — Other Ambulatory Visit: Payer: Self-pay | Admitting: *Deleted

## 2015-03-18 MED ORDER — NIACIN ER 1000 MG PO TBCR: 1.0000 | EXTENDED_RELEASE_TABLET | Freq: Every day | ORAL | Status: DC

## 2015-03-23 ENCOUNTER — Ambulatory Visit (INDEPENDENT_AMBULATORY_CARE_PROVIDER_SITE_OTHER): Payer: 59 | Admitting: Pharmacist

## 2015-03-23 ENCOUNTER — Encounter: Payer: Self-pay | Admitting: Family Medicine

## 2015-03-23 ENCOUNTER — Ambulatory Visit (INDEPENDENT_AMBULATORY_CARE_PROVIDER_SITE_OTHER): Payer: 59 | Admitting: Family Medicine

## 2015-03-23 VITALS — BP 122/76 | HR 69 | Temp 97.8°F | Ht 70.0 in | Wt 253.0 lb

## 2015-03-23 DIAGNOSIS — S90921A Unspecified superficial injury of right foot, initial encounter: Secondary | ICD-10-CM | POA: Diagnosis not present

## 2015-03-23 DIAGNOSIS — M791 Myalgia, unspecified site: Secondary | ICD-10-CM

## 2015-03-23 DIAGNOSIS — S90922A Unspecified superficial injury of left foot, initial encounter: Secondary | ICD-10-CM

## 2015-03-23 DIAGNOSIS — W57XXXA Bitten or stung by nonvenomous insect and other nonvenomous arthropods, initial encounter: Secondary | ICD-10-CM | POA: Diagnosis not present

## 2015-03-23 DIAGNOSIS — R739 Hyperglycemia, unspecified: Secondary | ICD-10-CM

## 2015-03-23 DIAGNOSIS — E559 Vitamin D deficiency, unspecified: Secondary | ICD-10-CM

## 2015-03-23 DIAGNOSIS — E782 Mixed hyperlipidemia: Secondary | ICD-10-CM

## 2015-03-23 DIAGNOSIS — L03818 Cellulitis of other sites: Secondary | ICD-10-CM

## 2015-03-23 MED ORDER — PREDNISONE 10 MG PO TABS: ORAL_TABLET | ORAL | Status: DC

## 2015-03-23 MED ORDER — METHYLPREDNISOLONE ACETATE 80 MG/ML IJ SUSP
60.0000 mg | Freq: Once | INTRAMUSCULAR | Status: AC
  Administered 2015-03-23: 60 mg via INTRAMUSCULAR

## 2015-03-23 MED ORDER — MUPIROCIN 2 % EX OINT: 1.0000 "application " | TOPICAL_OINTMENT | Freq: Two times a day (BID) | CUTANEOUS | Status: DC

## 2015-03-23 MED ORDER — CEPHALEXIN 500 MG PO CAPS: 500.0000 mg | ORAL_CAPSULE | Freq: Three times a day (TID) | ORAL | Status: DC

## 2015-03-23 NOTE — Patient Instructions (Addendum)
Use saline soaks 2 or 3 times daily and even saline soaked gauze between the toes. When out and moving around keep a piece of gauze between the 2 toes. Take prednisone as directed take antibiotic as directed and return to clinic in a few days for recheck Apply antibiotic ointment to the foreskin 2 or 3 times daily Use deep Joseph Art off before going out in wooded areas

## 2015-03-23 NOTE — Telephone Encounter (Signed)
Not sure what medication he is requesting from this note. Please advise.   Barby Colvard A. Chauncey Reading PA-C

## 2015-03-23 NOTE — Progress Notes (Signed)
Subjective:    Patient ID: KACPER CARTLIDGE, male    DOB: 05-27-1956, 59 y.o.   MRN: [id number]  HPI Patient here today for rash that is all over. He is taking medications regularly. The patient has multiple bites on both feet and up his legs. There is a blister between the first and second toe of the left foot on the second toe. There is actually no erythema. This is the second or third year that he has had these kind of bites.        Patient Active Problem List   Diagnosis Date Noted  . Hyperlipidemia, mixed 02/09/2015  . Other and unspecified hyperlipidemia 09/20/2010  . OBESITY, UNSPECIFIED 09/20/2010  . TOBACCO ABUSE 09/20/2010  . HYPERTENSION 09/20/2010  . Coronary atherosclerosis 09/20/2010  . GERD 09/20/2010  . RENAL CALCULUS, HX OF 09/20/2010   Outpatient Encounter Prescriptions as of 03/23/2015  Medication Sig  . acetaminophen (TYLENOL) 325 MG tablet Take 650 mg by mouth every 6 (six) hours as needed.    Marland Kitchen aspirin 81 MG tablet Take 1 tablet (81 mg total) by mouth daily.  Marland Kitchen atorvastatin (LIPITOR) 40 MG tablet TAKE 1 TABLET BY MOUTH ONCE DAILY  . Calcium Carbonate-Vitamin D (CALCIUM-VITAMIN D) 500-200 MG-UNIT per tablet Take 1 tablet by mouth daily.  . hydrocortisone 2.5 % cream Apply topically 2 (two) times daily. X 14 days. Then prn  . lisinopril (PRINIVIL,ZESTRIL) 5 MG tablet TAKE 1 TABLET BY MOUTH ONCE DAILY  . metoprolol succinate (TOPROL-XL) 50 MG 24 hr tablet Take 1 tablet (50 mg total) by mouth daily. Take with or immediately following a meal.  . omeprazole (PRILOSEC) 40 MG capsule TAKE 1 CAPSULE BY MOUTH ONCE DAILY  . prasugrel (EFFIENT) 10 MG TABS tablet Take 1 tablet (10 mg total) by mouth daily.  . valACYclovir (VALTREX) 500 MG tablet TAKE 1 TABLET BY MOUTH ONCE DAILY  . nitroGLYCERIN (NITROSTAT) 0.4 MG SL tablet Place 1 tablet (0.4 mg total) under the tongue every 5 (five) minutes as needed. (Patient not taking: Reported on 03/23/2015)  . [DISCONTINUED] Niacin  CR 1000 MG TBCR Take 1 tablet (1,000 mg total) by mouth at bedtime.   No facility-administered encounter medications on file as of 03/23/2015.     Review of Systems  Constitutional: Negative.   HENT: Negative.   Eyes: Negative.   Respiratory: Negative.   Cardiovascular: Negative.   Gastrointestinal: Negative.   Endocrine: Negative.   Genitourinary: Positive for penile pain (possible yeast or infection).  Musculoskeletal: Negative.   Skin: Positive for rash.  Allergic/Immunologic: Negative.   Neurological: Negative.   Hematological: Negative.   Psychiatric/Behavioral: Negative.        Objective:   Physical Exam  Constitutional: He is oriented to person, place, and time. He appears well-developed and well-nourished.  HENT:  Head: Normocephalic.  Genitourinary:  The patient also has a foreskin inflammation  Musculoskeletal: Normal range of motion.  Neurological: He is alert and oriented to person, place, and time.  Skin: Skin is warm. Rash noted. No pallor.  The patient has multiple bites on the lower extremities and many more on the feet and there is a blister between the first and second toe of the left foot.  Psychiatric: He has a normal mood and affect. His behavior is normal. Judgment and thought content normal.  Nursing note and vitals reviewed.  BP 122/76 mmHg  Pulse 69  Temp(Src) 97.8 F (36.6 C) (Oral)  Ht 5\' 10"  (1.778 m)  Wt 253  lb (114.76 kg)  BMI 36.30 kg/m2        Assessment & Plan:  1. Insect bite -Use saline soaks -Take Benadryl if needed for itching - predniSONE (DELTASONE) 10 MG tablet; 1 tablet 4 times a day for 2 days,  1 tablet 3 times a day for 2 days,  1 tablet 2 times a day for 2 days, 1 tablet daily for 2 days  Dispense: 20 tablet; Refill: 0 - methylPREDNISolone acetate (DEPO-MEDROL) injection 60 mg; Inject 0.75 mLs (60 mg total) into the muscle once.  2. Cellulitis of other specified site -Use antibiotic as directed  Patient  Instructions  Use saline soaks 2 or 3 times daily and even saline soaked gauze between the toes. When out and moving around keep a piece of gauze between the 2 toes. Take prednisone as directed take antibiotic as directed and return to clinic in a few days for recheck Apply antibiotic ointment to the foreskin 2 or 3 times daily Use deep Joseph Art off before going out in wooded areas   Nyra Capes MD

## 2015-03-23 NOTE — Progress Notes (Signed)
Patient ID: [name], male   DOB: 09-Sep-1955, 59 y.o.   MRN: [id number] Subjective:    [name] is a 59 y.o. male here for follow up of dyslipidemia. The patient does not use medications that may worsen dyslipidemias (corticosteroids, progestins, anabolic steroids, diuretics, beta-blockers, amiodarone, cyclosporine, olanzapine). The patient exercises usually 3 to 4 days per week - stationary bike for 20 minutes.  Patient is currently taking atrovastatin  1 tablet daily;  When I saw him about 6 weeks ago stopped Niaspan because patient was c/o myalgias  Patient c/o rare aches and pain - which has improved since stopped Niaspan  The patient is known to have coexisting coronary artery disease.   The following portions of the patient's history were reviewed and updated as appropriate: allergies, current medications, past family history, past medical history, past social history, past surgical history and problem list.   Objective:   Lab Review Lab Results  Component Value Date   CHOL 138 11/01/2014   CHOL 141 12/21/2013   CHOL 114 01/19/2013   CHOL  09/04/2010    153        ATP III CLASSIFICATION:  <200     mg/dL   Desirable  914-782  mg/dL   Borderline High  >=956    mg/dL   High          HDL 52 11/01/2014   HDL 40 12/21/2013   HDL 41 01/19/2013   HDL 43 09/04/2010      Assessment:    Dyslipidemia as detailed above with CAD / CHD Target levels for LDL are: < 70 mg/dl ("very high" risk for CHD)   Myalgias - possibly related to combo of niaspan and statin. Improved since stopped Niaspan  Rash / fungal infection / contact dermetitis  Vitamin D deficiency    Plan:      checked lipids/LFTs/vitamin D/CPK Patient is given appt to see Dr Christell Constant for 2pm today.    Henrene Pastor, PharmD, CPP

## 2015-03-23 NOTE — Addendum Note (Signed)
Addended by: Magdalene River on: 03/23/2015 02:37 PM   Modules accepted: Orders

## 2015-03-23 NOTE — Telephone Encounter (Signed)
His Valtrex & Lipitor were signed & sent to pharmacy

## 2015-03-23 NOTE — Patient Instructions (Signed)
  Increase non-starchy vegetables - carrots, green bean, squash, zucchini, tomatoes, onions, peppers, spinach and other green leafy vegetables, cabbage, lettuce, cucumbers, asparagus, okra (not fried), eggplant limit sugar and processed foods (cakes, cookies, ice cream, crackers and chips) Increase fresh fruit but limit serving sizes 1/2 cup or about the size of tennis or baseball limit red meat to no more than 1-2 times per week (serving size about the size of your palm) Choose whole grains / lean proteins - whole wheat bread, quinoa, whole grain rice (1/2 cup), fish, chicken, turkey  

## 2015-03-24 ENCOUNTER — Other Ambulatory Visit: Payer: Self-pay | Admitting: Pharmacist

## 2015-03-24 DIAGNOSIS — E559 Vitamin D deficiency, unspecified: Secondary | ICD-10-CM

## 2015-03-24 DIAGNOSIS — E8881 Metabolic syndrome: Secondary | ICD-10-CM | POA: Insufficient documentation

## 2015-03-24 LAB — CMP14+EGFR
A/G RATIO: 1.6 (ref 1.1–2.5)
ALBUMIN: 3.9 g/dL (ref 3.5–5.5)
ALT: 29 IU/L (ref 0–44)
AST: 23 IU/L (ref 0–40)
Alkaline Phosphatase: 101 IU/L (ref 39–117)
BILIRUBIN TOTAL: 0.3 mg/dL (ref 0.0–1.2)
BUN/Creatinine Ratio: 25 — ABNORMAL HIGH (ref 9–20)
BUN: 19 mg/dL (ref 6–24)
CALCIUM: 8.7 mg/dL (ref 8.7–10.2)
CO2: 20 mmol/L (ref 18–29)
Chloride: 102 mmol/L (ref 97–108)
Creatinine, Ser: 0.75 mg/dL — ABNORMAL LOW (ref 0.76–1.27)
GFR calc Af Amer: 117 mL/min/{1.73_m2} (ref >59)
GFR calc non Af Amer: 101 mL/min/{1.73_m2} (ref >59)
GLOBULIN, TOTAL: 2.4 g/dL (ref 1.5–4.5)
GLUCOSE: 120 mg/dL — AB (ref 65–99)
Potassium: 4.2 mmol/L (ref 3.5–5.2)
Sodium: 140 mmol/L (ref 134–144)
TOTAL PROTEIN: 6.3 g/dL (ref 6.0–8.5)

## 2015-03-24 LAB — LIPID PANEL
CHOLESTEROL TOTAL: 152 mg/dL (ref 100–199)
Chol/HDL Ratio: 4.2 ratio units (ref 0.0–5.0)
HDL: 36 mg/dL — ABNORMAL LOW (ref >39)
LDL Calculated: 51 mg/dL (ref 0–99)
Triglycerides: 324 mg/dL — ABNORMAL HIGH (ref 0–149)
VLDL CHOLESTEROL CAL: 65 mg/dL — AB (ref 5–40)

## 2015-03-24 LAB — POCT GLYCOSYLATED HEMOGLOBIN (HGB A1C): Hemoglobin A1C: 5.6

## 2015-03-24 LAB — VITAMIN D 25 HYDROXY (VIT D DEFICIENCY, FRACTURES): Vit D, 25-Hydroxy: 19.3 ng/mL — ABNORMAL LOW (ref 30.0–100.0)

## 2015-03-24 LAB — CK: CK TOTAL: 256 U/L — AB (ref 24–204)

## 2015-03-24 LAB — LDL CHOLESTEROL, DIRECT: LDL Direct: 95 mg/dL (ref 0–99)

## 2015-03-24 MED ORDER — VITAMIN D (ERGOCALCIFEROL) 1.25 MG (50000 UNIT) PO CAPS: 50000.0000 [IU] | ORAL_CAPSULE | ORAL | Status: DC

## 2015-03-24 MED ORDER — FISH OIL 1000 MG PO CAPS: 2.0000 | ORAL_CAPSULE | Freq: Every day | ORAL | Status: AC

## 2015-03-24 NOTE — Addendum Note (Signed)
Addended by: Tommas Olp on: 03/24/2015 09:31 AM   Modules accepted: Orders

## 2015-04-01 ENCOUNTER — Encounter: Payer: Self-pay | Admitting: Family Medicine

## 2015-04-01 ENCOUNTER — Ambulatory Visit (INDEPENDENT_AMBULATORY_CARE_PROVIDER_SITE_OTHER): Payer: 59 | Admitting: Family Medicine

## 2015-04-01 VITALS — BP 122/70 | HR 56 | Temp 96.9°F | Ht 70.0 in | Wt 252.0 lb

## 2015-04-01 DIAGNOSIS — S90921D Unspecified superficial injury of right foot, subsequent encounter: Secondary | ICD-10-CM | POA: Diagnosis not present

## 2015-04-01 DIAGNOSIS — W57XXXA Bitten or stung by nonvenomous insect and other nonvenomous arthropods, initial encounter: Secondary | ICD-10-CM

## 2015-04-01 DIAGNOSIS — N481 Balanitis: Secondary | ICD-10-CM

## 2015-04-01 MED ORDER — SULFAMETHOXAZOLE-TRIMETHOPRIM 800-160 MG PO TABS: 1.0000 | ORAL_TABLET | Freq: Two times a day (BID) | ORAL | Status: DC

## 2015-04-01 NOTE — Patient Instructions (Signed)
Finish the Keflex and then take 10 days worth of Septra DS 1 twice daily for infection until completed. Continue to use Bactroban ointment to the foreskin area. Use this twice daily. Also use a scent free soap as mentioned like Dial.

## 2015-04-01 NOTE — Progress Notes (Signed)
Subjective:    Patient ID: RAHKEEM SENFT, male    DOB: [birth year], 59 y.o.   MRN: [id number]  HPI Patient here today for 1 week follow up on cellulitis / insect bite. The insect bites on his feet have definitely improved there is no redness or drainage and he is happy about this. The problems with the penis and the foreskin he is still unable to retract the foreskin but there is minimal redness or infection there. He has a few more days Keflex left.       Patient Active Problem List   Diagnosis Date Noted  . Vitamin D deficiency 03/24/2015  . Metabolic syndrome 03/24/2015  . Hyperlipidemia, mixed 02/09/2015  . Other and unspecified hyperlipidemia 09/20/2010  . OBESITY, UNSPECIFIED 09/20/2010  . TOBACCO ABUSE 09/20/2010  . HYPERTENSION 09/20/2010  . Coronary atherosclerosis 09/20/2010  . GERD 09/20/2010  . RENAL CALCULUS, HX OF 09/20/2010   Outpatient Encounter Prescriptions as of 04/01/2015  Medication Sig  . acetaminophen (TYLENOL) 325 MG tablet Take 650 mg by mouth every 6 (six) hours as needed.    Marland Kitchen aspirin 81 MG tablet Take 1 tablet (81 mg total) by mouth daily.  Marland Kitchen atorvastatin (LIPITOR) 40 MG tablet TAKE 1 TABLET BY MOUTH ONCE DAILY  . Calcium Carbonate-Vitamin D (CALCIUM-VITAMIN D) 500-200 MG-UNIT per tablet Take 1 tablet by mouth daily.  . cephALEXin (KEFLEX) 500 MG capsule Take 1 capsule (500 mg total) by mouth 3 (three) times daily.  . hydrocortisone 2.5 % cream Apply topically 2 (two) times daily. X 14 days. Then prn  . lisinopril (PRINIVIL,ZESTRIL) 5 MG tablet TAKE 1 TABLET BY MOUTH ONCE DAILY  . metoprolol succinate (TOPROL-XL) 50 MG 24 hr tablet Take 1 tablet (50 mg total) by mouth daily. Take with or immediately following a meal.  . mupirocin ointment (BACTROBAN) 2 % Apply 1 application topically 2 (two) times daily. To affected area  . nitroGLYCERIN (NITROSTAT) 0.4 MG SL tablet Place 1 tablet (0.4 mg total) under the tongue every 5 (five) minutes as needed.  .  Omega-3 Fatty Acids (FISH OIL) 1000 MG CAPS Take 2 capsules (2,000 mg total) by mouth daily.  Marland Kitchen omeprazole (PRILOSEC) 40 MG capsule TAKE 1 CAPSULE BY MOUTH ONCE DAILY  . prasugrel (EFFIENT) 10 MG TABS tablet Take 1 tablet (10 mg total) by mouth daily.  . valACYclovir (VALTREX) 500 MG tablet TAKE 1 TABLET BY MOUTH ONCE DAILY  . Vitamin D, Ergocalciferol, (DRISDOL) 50000 UNITS CAPS capsule Take 1 capsule (50,000 Units total) by mouth every 7 (seven) days.  . [DISCONTINUED] predniSONE (DELTASONE) 10 MG tablet 1 tablet 4 times a day for 2 days,  1 tablet 3 times a day for 2 days,  1 tablet 2 times a day for 2 days, 1 tablet daily for 2 days   No facility-administered encounter medications on file as of 04/01/2015.     Review of Systems  Constitutional: Negative.   HENT: Negative.   Eyes: Negative.   Respiratory: Negative.   Cardiovascular: Negative.   Gastrointestinal: Negative.   Endocrine: Negative.   Genitourinary: Positive for penile swelling and penile pain.  Musculoskeletal: Negative.   Skin: Negative.   Allergic/Immunologic: Negative.   Neurological: Negative.   Hematological: Negative.   Psychiatric/Behavioral: Negative.        Objective:   Physical Exam  Constitutional: He is oriented to person, place, and time. He appears well-developed and well-nourished. No distress.  Genitourinary: Penis normal.  There is still slight swelling of  the foreskin and he is unable to retract this at this point in time. There is minimal redness no edema and no drainage.  Musculoskeletal: Normal range of motion.  Neurological: He is alert and oriented to person, place, and time.  Skin: Skin is warm and dry. No rash noted. No erythema.  The insect bites on both ankles have greatly improved with no erythema and all lesions appear to be drying at this point in time.  Psychiatric: He has a normal mood and affect. His behavior is normal. Judgment and thought content normal.  Nursing note and  vitals reviewed.  BP 122/70 mmHg  Pulse 56  Temp(Src) 96.9 F (36.1 C) (Oral)  Ht  (1.778 m)  Wt 252 lb (114.306 kg)  BMI 36.16 kg/m2        Assessment & Plan:  1. Balanitis -This appears to be improved. -The patient will continue and finish the Keflex and will take one round of Septra DS for 10 days twice daily. -He will continue to use the Bactroban ointment sparingly twice daily  2. Insect bite -The inflammation from these bites on both feet have greatly improved.  Meds ordered this encounter  Medications  . sulfamethoxazole-trimethoprim (BACTRIM DS,SEPTRA DS) 800-160 MG per tablet    Sig: Take 1 tablet by mouth 2 (two) times daily.    Dispense:  20 tablet    Refill:  0   Patient Instructions  Finish the Keflex and then take 10 days worth of Septra DS 1 twice daily for infection until completed. Continue to use Bactroban ointment to the foreskin area. Use this twice daily. Also use a scent free soap as mentioned like Dial.   Nyra Capes MD

## 2015-04-07 ENCOUNTER — Other Ambulatory Visit: Payer: Self-pay

## 2015-04-07 MED ORDER — LISINOPRIL 5 MG PO TABS: 5.0000 mg | ORAL_TABLET | Freq: Every day | ORAL | Status: DC

## 2015-04-12 ENCOUNTER — Other Ambulatory Visit: Payer: Self-pay

## 2015-04-12 MED ORDER — METOPROLOL SUCCINATE ER 50 MG PO TB24: 50.0000 mg | ORAL_TABLET | Freq: Every day | ORAL | Status: DC

## 2015-04-15 ENCOUNTER — Encounter: Payer: Self-pay | Admitting: Family Medicine

## 2015-04-15 ENCOUNTER — Ambulatory Visit (INDEPENDENT_AMBULATORY_CARE_PROVIDER_SITE_OTHER): Payer: 59 | Admitting: Family Medicine

## 2015-04-15 VITALS — BP 117/76 | HR 67 | Temp 97.2°F | Ht 70.0 in | Wt 251.0 lb

## 2015-04-15 DIAGNOSIS — N481 Balanitis: Secondary | ICD-10-CM

## 2015-04-15 MED ORDER — CEPHALEXIN 500 MG PO CAPS: 500.0000 mg | ORAL_CAPSULE | Freq: Three times a day (TID) | ORAL | Status: DC

## 2015-04-15 NOTE — Patient Instructions (Addendum)
Use unscented fabric detergent, softeners and soaps We will arrange appointment with [facility] - we will call you. Take antibiotic as directed and continue to use Bactroban ointment in combination with steroid cream as directed Also avoid scented soaps fabric softeners and detergents

## 2015-04-15 NOTE — Progress Notes (Signed)
Subjective:    Patient ID: [name], male    DOB: 24-Apr-1956, 59 y.o.   MRN: [id number]  HPI Patient here today for a 2 week follow up on insect bite and balanitis. This has improved some but not as much as the patient would like. He has taken sulfa and has been using Bactroban ointment.      Patient Active Problem List   Diagnosis Date Noted  . Vitamin D deficiency 03/24/2015  . Metabolic syndrome 03/24/2015  . Hyperlipidemia, mixed 02/09/2015  . Other and unspecified hyperlipidemia 09/20/2010  . OBESITY, UNSPECIFIED 09/20/2010  . TOBACCO ABUSE 09/20/2010  . HYPERTENSION 09/20/2010  . Coronary atherosclerosis 09/20/2010  . GERD 09/20/2010  . RENAL CALCULUS, HX OF 09/20/2010   Outpatient Encounter Prescriptions as of 04/15/2015  Medication Sig  . acetaminophen (TYLENOL) 325 MG tablet Take 650 mg by mouth every 6 (six) hours as needed.    Marland Kitchen aspirin 81 MG tablet Take 1 tablet (81 mg total) by mouth daily.  Marland Kitchen atorvastatin (LIPITOR) 40 MG tablet TAKE 1 TABLET BY MOUTH ONCE DAILY  . Calcium Carbonate-Vitamin D (CALCIUM-VITAMIN D) 500-200 MG-UNIT per tablet Take 1 tablet by mouth daily.  . hydrocortisone 2.5 % cream Apply topically 2 (two) times daily. X 14 days. Then prn  . lisinopril (PRINIVIL,ZESTRIL) 5 MG tablet Take 1 tablet (5 mg total) by mouth daily.  . metoprolol succinate (TOPROL-XL) 50 MG 24 hr tablet Take 1 tablet (50 mg total) by mouth daily. Take with or immediately following a meal.  . mupirocin ointment (BACTROBAN) 2 % Apply 1 application topically 2 (two) times daily. To affected area  . nitroGLYCERIN (NITROSTAT) 0.4 MG SL tablet Place 1 tablet (0.4 mg total) under the tongue every 5 (five) minutes as needed.  . Omega-3 Fatty Acids (FISH OIL) 1000 MG CAPS Take 2 capsules (2,000 mg total) by mouth daily.  Marland Kitchen omeprazole (PRILOSEC) 40 MG capsule TAKE 1 CAPSULE BY MOUTH ONCE DAILY  . prasugrel (EFFIENT) 10 MG TABS tablet Take 1 tablet (10 mg total) by mouth daily.  .  valACYclovir (VALTREX) 500 MG tablet TAKE 1 TABLET BY MOUTH ONCE DAILY  . Vitamin D, Ergocalciferol, (DRISDOL) 50000 UNITS CAPS capsule Take 1 capsule (50,000 Units total) by mouth every 7 (seven) days.  . [DISCONTINUED] cephALEXin (KEFLEX) 500 MG capsule Take 1 capsule (500 mg total) by mouth 3 (three) times daily.  . [DISCONTINUED] sulfamethoxazole-trimethoprim (BACTRIM DS,SEPTRA DS) 800-160 MG per tablet Take 1 tablet by mouth 2 (two) times daily.   No facility-administered encounter medications on file as of 04/15/2015.      Review of Systems  Constitutional: Negative.   HENT: Negative.   Eyes: Negative.   Respiratory: Negative.   Cardiovascular: Negative.   Gastrointestinal: Negative.   Endocrine: Negative.   Genitourinary: Negative.   Musculoskeletal: Negative.   Skin: Negative.   Allergic/Immunologic: Negative.   Neurological: Negative.   Hematological: Negative.   Psychiatric/Behavioral: Negative.        Objective:   Physical Exam  Constitutional: He is oriented to person, place, and time.  Genitourinary:  The patient continues to have foreskin swelling and edema and is still unable to retract the foreskin. There is minimal redness just mostly swelling of the foreskin.  Musculoskeletal: Normal range of motion.  Neurological: He is alert and oriented to person, place, and time.  Skin: Skin is warm and dry. No rash noted. No erythema. No pallor.  There is foreskin edema without redness erythema or drainage.  Psychiatric: He has a normal mood and affect. His behavior is normal. Judgment and thought content normal.  Nursing note and vitals reviewed.  BP 117/76 mmHg  Pulse 67  Temp(Src) 97.2 F (36.2 C) (Oral)  Ht  (1.778 m)  Wt 251 lb (113.853 kg)  BMI 36.01 kg/m2        Assessment & Plan:  1. Balanitis -Take antibiotic as directed and continue to use Bactroban in combination with cortisone 10. - Ambulatory referral to Urology  Patient Instructions    Use unscented fabric detergent, softeners and soaps We will arrange appointment with [facility] - we will call you. Take antibiotic as directed and continue to use Bactroban ointment in combination with steroid cream as directed Also avoid scented soaps fabric softeners and detergents   Nyra Capes MD

## 2015-05-18 ENCOUNTER — Ambulatory Visit (INDEPENDENT_AMBULATORY_CARE_PROVIDER_SITE_OTHER): Payer: 59 | Admitting: Urology

## 2015-05-18 DIAGNOSIS — N481 Balanitis: Secondary | ICD-10-CM | POA: Diagnosis not present

## 2015-05-18 DIAGNOSIS — N471 Phimosis: Secondary | ICD-10-CM | POA: Diagnosis not present

## 2015-06-15 ENCOUNTER — Encounter: Payer: Self-pay | Admitting: Pharmacist

## 2015-06-15 ENCOUNTER — Ambulatory Visit (INDEPENDENT_AMBULATORY_CARE_PROVIDER_SITE_OTHER): Payer: 59 | Admitting: Pharmacist

## 2015-06-15 VITALS — BP 122/82 | HR 64 | Ht 70.0 in | Wt 245.0 lb

## 2015-06-15 DIAGNOSIS — E559 Vitamin D deficiency, unspecified: Secondary | ICD-10-CM

## 2015-06-15 DIAGNOSIS — E669 Obesity, unspecified: Secondary | ICD-10-CM | POA: Diagnosis not present

## 2015-06-15 DIAGNOSIS — E782 Mixed hyperlipidemia: Secondary | ICD-10-CM

## 2015-06-15 NOTE — Progress Notes (Signed)
Patient ID: [name], male   DOB: [birth year], 59 y.o.   MRN: [id number] Subjective:    [name] is a 59 y.o. male here for follow up of dyslipidemia. The patient does not use medications that may worsen dyslipidemias (corticosteroids, progestins, anabolic steroids, diuretics, beta-blockers, amiodarone, cyclosporine, olanzapine).  The patient exercises usually 3 to 4 days per week - stationary bike for 20 minutes.  Diet - eats out a lot;  Is not following diet recommendations we discussed at lats visit.  No beverages with high sugar content.  Patient is currently taking atrovastatin 40mg  1 tablet daily and omega 3 FA 2000mg  When I saw him about   3 months ago stopped Niaspan because patient was c/o myalgias  Patient c/o rare aches and pain - which has improved since stopped Niaspan  The patient is known to have coexisting coronary artery disease.   Also determined that vitamin D was low at last visit and he has been taking 50,000IU weekly since  The following portions of the patient's history were reviewed and updated as appropriate: allergies, current medications, past family history, past medical history, past social history, past surgical history and problem list.   Objective:    Filed Vitals:   06/15/15 1038  BP: 122/82  Pulse: 64   Filed Weights   06/15/15 1038  Weight: 245 lb (111.131 kg)   Body mass index is 35.15 kg/(m^2).  Lab Review Lab Results  Component Value Date   CHOL 152 03/23/2015   CHOL 138 11/01/2014   CHOL 141 12/21/2013   CHOL 114 01/19/2013   CHOL  09/04/2010    153        ATP III CLASSIFICATION:  <200     mg/dL   Desirable  [id number]  mg/dL   Borderline High  >=914>=240    mg/dL   High          HDL 36* 03/23/2015   HDL 52 11/01/2014   HDL 40 12/21/2013   HDL 41 01/19/2013   HDL 43 09/04/2010   LDLDIRECT 95 03/23/2015    Triglycerides were elevated 03/23/2015 at 324    Vitamin D = 19.3 (03/23/2015)    Assessment:    Dyslipidemia as  detailed above with CAD / CHD Target levels for LDL are: < 70 mg/dl ("very high" risk for CHD)   Myalgias - possibly related to combo of niaspan and statin. Improved since stopped Niaspan  Vitamin D deficiency  Obesity    Plan:      1.  checked lipids/LFTs/vitamin D/ 2.  No medications changes recommended until after labs available 3.  Discussed low fat / low sugar / low CHO diet.  Specifically discussed serving sizes and limiting fried foods.  Patient is doing good job staying aware for calorie and sugar containing beverages.  4.  Continue to exercise - try to increase to 5 days per week.  Henrene Pastorammy Rosabel Sermeno, PharmD, CPP

## 2015-06-16 ENCOUNTER — Other Ambulatory Visit: Payer: Self-pay | Admitting: Pharmacist

## 2015-06-16 LAB — CMP14+EGFR
A/G RATIO: 1.7 (ref 1.1–2.5)
ALBUMIN: 3.8 g/dL (ref 3.5–5.5)
ALT: 22 IU/L (ref 0–44)
AST: 19 IU/L (ref 0–40)
Alkaline Phosphatase: 103 IU/L (ref 39–117)
BUN / CREAT RATIO: 28 — AB (ref 9–20)
BUN: 19 mg/dL (ref 6–24)
CHLORIDE: 105 mmol/L (ref 97–106)
CO2: 21 mmol/L (ref 18–29)
Calcium: 8.8 mg/dL (ref 8.7–10.2)
Creatinine, Ser: 0.68 mg/dL — ABNORMAL LOW (ref 0.76–1.27)
GFR calc non Af Amer: 105 mL/min/{1.73_m2} (ref >59)
GFR, EST AFRICAN AMERICAN: 122 mL/min/{1.73_m2} (ref >59)
Globulin, Total: 2.3 g/dL (ref 1.5–4.5)
Glucose: 94 mg/dL (ref 65–99)
POTASSIUM: 4.7 mmol/L (ref 3.5–5.2)
Sodium: 142 mmol/L (ref 136–144)
TOTAL PROTEIN: 6.1 g/dL (ref 6.0–8.5)

## 2015-06-16 LAB — LIPID PANEL
Chol/HDL Ratio: 4.2 ratio units (ref 0.0–5.0)
Cholesterol, Total: 147 mg/dL (ref 100–199)
HDL: 35 mg/dL — AB (ref >39)
LDL Calculated: 70 mg/dL (ref 0–99)
TRIGLYCERIDES: 210 mg/dL — AB (ref 0–149)
VLDL Cholesterol Cal: 42 mg/dL — ABNORMAL HIGH (ref 5–40)

## 2015-06-16 LAB — VITAMIN D 25 HYDROXY (VIT D DEFICIENCY, FRACTURES): Vit D, 25-Hydroxy: 26.5 ng/mL — ABNORMAL LOW (ref 30.0–100.0)

## 2015-06-16 MED ORDER — VITAMIN D (ERGOCALCIFEROL) 1.25 MG (50000 UNIT) PO CAPS: 50000.0000 [IU] | ORAL_CAPSULE | ORAL | Status: DC

## 2015-07-05 ENCOUNTER — Other Ambulatory Visit: Payer: Self-pay

## 2015-07-05 MED ORDER — PRASUGREL HCL 10 MG PO TABS: 10.0000 mg | ORAL_TABLET | Freq: Every day | ORAL | Status: DC

## 2015-07-05 NOTE — Telephone Encounter (Signed)
[facility] pharmacy called to request a refill for Effient  10 mg for this pt. 90 day supply sent in with one refill.

## 2015-07-27 ENCOUNTER — Other Ambulatory Visit: Payer: Self-pay | Admitting: Physician Assistant

## 2015-08-30 ENCOUNTER — Other Ambulatory Visit: Payer: Self-pay | Admitting: Physician Assistant

## 2015-09-05 ENCOUNTER — Other Ambulatory Visit: Payer: Self-pay | Admitting: Pharmacist

## 2015-09-05 NOTE — Telephone Encounter (Signed)
Last seen 04/15/15 DWM   Last Vit D 06/15/15  26.5

## 2015-09-11 ENCOUNTER — Other Ambulatory Visit: Payer: Self-pay | Admitting: Pharmacist

## 2015-10-05 ENCOUNTER — Other Ambulatory Visit: Payer: Self-pay | Admitting: Family Medicine

## 2015-10-08 ENCOUNTER — Other Ambulatory Visit: Payer: Self-pay | Admitting: Family Medicine

## 2015-10-19 ENCOUNTER — Encounter: Payer: Self-pay | Admitting: Family Medicine

## 2015-10-19 ENCOUNTER — Ambulatory Visit (INDEPENDENT_AMBULATORY_CARE_PROVIDER_SITE_OTHER): Payer: 59 | Admitting: Family Medicine

## 2015-10-19 ENCOUNTER — Ambulatory Visit (INDEPENDENT_AMBULATORY_CARE_PROVIDER_SITE_OTHER): Payer: 59

## 2015-10-19 VITALS — BP 114/78 | HR 79 | Temp 97.3°F | Ht 70.0 in | Wt 254.4 lb

## 2015-10-19 DIAGNOSIS — I10 Essential (primary) hypertension: Secondary | ICD-10-CM

## 2015-10-19 DIAGNOSIS — E669 Obesity, unspecified: Secondary | ICD-10-CM

## 2015-10-19 DIAGNOSIS — Z8249 Family history of ischemic heart disease and other diseases of the circulatory system: Secondary | ICD-10-CM | POA: Diagnosis not present

## 2015-10-19 DIAGNOSIS — I2510 Atherosclerotic heart disease of native coronary artery without angina pectoris: Secondary | ICD-10-CM | POA: Diagnosis not present

## 2015-10-19 DIAGNOSIS — L850 Acquired ichthyosis: Secondary | ICD-10-CM | POA: Diagnosis not present

## 2015-10-19 DIAGNOSIS — E782 Mixed hyperlipidemia: Secondary | ICD-10-CM

## 2015-10-19 DIAGNOSIS — E8881 Metabolic syndrome: Secondary | ICD-10-CM

## 2015-10-19 DIAGNOSIS — L209 Atopic dermatitis, unspecified: Secondary | ICD-10-CM

## 2015-10-19 DIAGNOSIS — E559 Vitamin D deficiency, unspecified: Secondary | ICD-10-CM | POA: Diagnosis not present

## 2015-10-19 DIAGNOSIS — L853 Xerosis cutis: Secondary | ICD-10-CM

## 2015-10-19 NOTE — Patient Instructions (Addendum)
Continue current medications. Continue good therapeutic lifestyle changes which include good diet and exercise. Fall precautions discussed with patient. If an FOBT was given today- please return it to our front desk. If you are over 59 years old - you may need Prevnar 13 or the adult Pneumonia vaccine.  Flu Shots will be available at our office starting mid- September. Please call and schedule a FLU CLINIC APPOINTMENT.  This patient should use scent free fabric softeners detergents and soaps He should use the Lidex-E cream sparingly 2-3 times daily to the affected areas of skin for 10-14 days If the rash persists beyond that time he should call us and we will make a referral to the dermatologist He should schedule himself to visit with the ophthalmologist for a good routine eye exam He should make every effort possible to lose weight, this can be done by exercising and walking more and drinking more water This is very important He should keep his appointment with the cardiologist as planned

## 2015-10-19 NOTE — Progress Notes (Signed)
Subjective:    Patient ID: [name], male    DOB: April 23, 1956, 60 y.o.   MRN: [id number]  HPI Patient is here today for a follow up on his chronic medical problems. Patient also has a rash on his right lower leg. Patient also has hypertension hyperlipidemia and coronary artery disease. He has metabolic syndrome. His previous weight was 245 it is 254 today. He is due to get lab work and has not had a chest x-ray since 2012. The patient sees a cardiologist regularly on a yearly basis. He has a somewhat sedentary job with [employer]. He denies any chest pain or shortness of breath. He has no trouble with swallowing heartburn indigestion and nausea vomiting diarrhea or blood in the stool. He is passing his water without problems. He has had this rash on his leg for a while his tried cortisone 10 off and on with some relief at times. He does have dry skin. He is currently using Dove soap and he is not sure about the fabric softeners but thinks that they are sent free. He uses Tide detergent.   Review of Systems  Constitutional: Negative.   HENT: Negative.   Eyes: Negative.   Respiratory: Negative.   Cardiovascular: Negative.   Gastrointestinal: Negative.   Endocrine: Negative.   Genitourinary: Negative.   Musculoskeletal: Negative.   Skin: Positive for rash.       Right lower leg   Allergic/Immunologic: Negative.   Neurological: Negative.   Hematological: Negative.   Psychiatric/Behavioral: Negative.         Patient Active Problem List   Diagnosis Date Noted  . Vitamin D deficiency 03/24/2015  . Metabolic syndrome 02/63/7858  . Hyperlipidemia, mixed 02/09/2015  . Other and unspecified hyperlipidemia 09/20/2010  . OBESITY, UNSPECIFIED 09/20/2010  . TOBACCO ABUSE 09/20/2010  . HYPERTENSION 09/20/2010  . Coronary atherosclerosis 09/20/2010  . GERD 09/20/2010  . RENAL CALCULUS, HX OF 09/20/2010   Outpatient Encounter Prescriptions as of 10/19/2015  Medication Sig  .  acetaminophen (TYLENOL) 325 MG tablet Take 650 mg by mouth every 6 (six) hours as needed.    Marland Kitchen aspirin 81 MG tablet Take 1 tablet (81 mg total) by mouth daily.  Marland Kitchen atorvastatin (LIPITOR) 40 MG tablet TAKE 1 TABLET (40 MG TOTAL) BY MOUTH DAILY.  . Calcium Carbonate-Vitamin D (CALCIUM-VITAMIN D) 500-200 MG-UNIT per tablet Take 1 tablet by mouth daily.  Marland Kitchen lisinopril (PRINIVIL,ZESTRIL) 5 MG tablet TAKE 1 TABLET (5 MG TOTAL) BY MOUTH DAILY.  . metoprolol succinate (TOPROL-XL) 50 MG 24 hr tablet TAKE 1 TABLET (50 MG TOTAL) BY MOUTH DAILY. TAKE WITH OR IMMEDIATELY FOLLOWING A MEAL.  . nitroGLYCERIN (NITROSTAT) 0.4 MG SL tablet Place 1 tablet (0.4 mg total) under the tongue every 5 (five) minutes as needed.  . Omega-3 Fatty Acids (FISH OIL) 1000 MG CAPS Take 2 capsules (2,000 mg total) by mouth daily.  Marland Kitchen omeprazole (PRILOSEC) 40 MG capsule TAKE 1 CAPSULE BY MOUTH ONCE DAILY  . prasugrel (EFFIENT) 10 MG TABS tablet Take 1 tablet (10 mg total) by mouth daily.  . valACYclovir (VALTREX) 500 MG tablet TAKE 1 TABLET BY MOUTH ONCE DAILY  . Vitamin D, Ergocalciferol, (DRISDOL) 50000 units CAPS capsule TAKE 1 CAPSULE (50,000 UNITS TOTAL) BY MOUTH EVERY 7 (SEVEN) DAYS.   No facility-administered encounter medications on file as of 10/19/2015.       Objective:   Physical Exam  Constitutional: He is oriented to person, place, and time. He appears well-developed and well-nourished.  No distress.  HENT:  Head: Normocephalic and atraumatic.  Right Ear: External ear normal.  Left Ear: External ear normal.  Nose: Nose normal.  Mouth/Throat: Oropharynx is clear and moist. No oropharyngeal exudate.  There is slight skin irritation behind the right ear lobe and ear.  Eyes: Conjunctivae and EOM are normal. Pupils are equal, round, and reactive to light. Right eye exhibits no discharge. Left eye exhibits no discharge. No scleral icterus.  Neck: Normal range of motion. Neck supple. No thyromegaly present.  No bruits  thyromegaly or anterior cervical adenopathy  Cardiovascular: Normal rate, regular rhythm, normal heart sounds and intact distal pulses.   No murmur heard. The heart has a regular rate and rhythm at about 72/m  Pulmonary/Chest: Effort normal and breath sounds normal. No respiratory distress. He has no wheezes. He has no rales. He exhibits no tenderness.  Clear anteriorly and posteriorly  Abdominal: Soft. Bowel sounds are normal. He exhibits no mass. There is no tenderness. There is no rebound and no guarding.  The abdomen is obese without tenderness masses or organ enlargement  Musculoskeletal: Normal range of motion. He exhibits no edema or tenderness.  Lymphadenopathy:    He has no cervical adenopathy.  Neurological: He is alert and oriented to person, place, and time. He has normal reflexes. No cranial nerve deficit.  Skin: Skin is warm and dry. Rash noted. There is erythema. No pallor.  The skin is very dry and there is a longitudinal flat slightly erythematous rash on the medial left leg about 3-4 inches long and 1 inch wide. The margins are irregular.  Psychiatric: He has a normal mood and affect. His behavior is normal. Judgment and thought content normal.  Nursing note and vitals reviewed.   BP 114/78 mmHg  Pulse 79  Temp(Src) 97.3 F (36.3 C) (Oral)  Ht '5\' 10"'  (1.778 m)  Wt 254 lb 6.4 oz (115.395 kg)  BMI 36.50 kg/m2  [facility] reading (PRIMARY) by  Dr. Brunilda Payor x-ray-results pending                                       Assessment & Plan:  1. Atherosclerosis of native coronary artery of native heart without angina pectoris -Continue with aggressive therapeutic lifestyle changes cholesterol medicine and follow-up with cardiology  2. Essential hypertension -The blood pressure is good today and patient will continue with current treatment - DG Chest 2 View; Future - CBC with Differential/Platelet - BMP8+EGFR  3. Hyperlipidemia, mixed -Continue with atorvastatin and  aggressive therapeutic lifestyle changes which include diet and exercise and weight loss - DG Chest 2 View; Future - Lipid panel - Hepatic function panel  4. Metabolic syndrome -Continue with aggressive therapeutic lifestyle changes including diet exercise and weight loss  5. Vitamin D deficiency -Continue with current treatment and results of lab work - VITAMIN D 25 Hydroxy (Vit-D Deficiency, Fractures)  6. Family history of heart disease -The patient is well aware of his family history and understands the role that obesity plays with heart disease.  7. Obesity -Continue with weight loss increased water intake and exercise  8. Atopic dermatitis -Use the Lidex-E sparingly to the rash on the leg and behind the ear  9. Dry skin dermatitis -Use only scent free fabric softeners detergents and soaps   Meds ordered this encounter  Medications  . fluocinonide cream (LIDEX) 0.05 %    Sig: Apply 1 application  topically 2 (two) times daily.   Patient Instructions  Continue current medications. Continue good therapeutic lifestyle changes which include good diet and exercise. Fall precautions discussed with patient. If an FOBT was given today- please return it to our front desk. If you are over 16 years old - you may need Prevnar 75 or the adult Pneumonia vaccine.  Flu Shots will be available at our office starting mid- September. Please call and schedule a FLU CLINIC APPOINTMENT.  This patient should use scent free fabric softeners detergents and soaps He should use the Lidex-E cream sparingly 2-3 times daily to the affected areas of skin for 10-14 days If the rash persists beyond that time he should call us and we will make a referral to the dermatologist He should schedule himself to visit with the ophthalmologist for a good routine eye exam He should make every effort possible to lose weight, this can be done by exercising and walking more and drinking more water This is very  important He should keep his appointment with the cardiologist as planned   Arrie Senate MD

## 2015-10-20 LAB — VITAMIN D 25 HYDROXY (VIT D DEFICIENCY, FRACTURES): VIT D 25 HYDROXY: 34.7 ng/mL (ref 30.0–100.0)

## 2015-10-20 LAB — HEPATIC FUNCTION PANEL
ALBUMIN: 4.2 g/dL (ref 3.5–5.5)
ALT: 32 IU/L (ref 0–44)
AST: 20 IU/L (ref 0–40)
Alkaline Phosphatase: 116 IU/L (ref 39–117)
BILIRUBIN TOTAL: 0.3 mg/dL (ref 0.0–1.2)
Bilirubin, Direct: 0.13 mg/dL (ref 0.00–0.40)
Total Protein: 6.9 g/dL (ref 6.0–8.5)

## 2015-10-20 LAB — CBC WITH DIFFERENTIAL/PLATELET
BASOS ABS: 0 10*3/uL (ref 0.0–0.2)
Basos: 0 %
EOS (ABSOLUTE): 0.1 10*3/uL (ref 0.0–0.4)
Eos: 1 %
Hematocrit: 45.5 % (ref 37.5–51.0)
Hemoglobin: 15.4 g/dL (ref 12.6–17.7)
IMMATURE GRANS (ABS): 0 10*3/uL (ref 0.0–0.1)
IMMATURE GRANULOCYTES: 0 %
LYMPHS: 48 %
Lymphocytes Absolute: 5.8 10*3/uL — ABNORMAL HIGH (ref 0.7–3.1)
MCH: 30.4 pg (ref 26.6–33.0)
MCHC: 33.8 g/dL (ref 31.5–35.7)
MCV: 90 fL (ref 79–97)
Monocytes Absolute: 1.2 10*3/uL — ABNORMAL HIGH (ref 0.1–0.9)
Monocytes: 10 %
NEUTROS PCT: 41 %
Neutrophils Absolute: 5 10*3/uL (ref 1.4–7.0)
PLATELETS: 330 10*3/uL (ref 150–379)
RBC: 5.06 x10E6/uL (ref 4.14–5.80)
RDW: 15.1 % (ref 12.3–15.4)
WBC: 12.1 10*3/uL — AB (ref 3.4–10.8)

## 2015-10-20 LAB — LIPID PANEL
CHOL/HDL RATIO: 3.3 ratio (ref 0.0–5.0)
Cholesterol, Total: 150 mg/dL (ref 100–199)
HDL: 46 mg/dL (ref >39)
LDL Calculated: 81 mg/dL (ref 0–99)
TRIGLYCERIDES: 114 mg/dL (ref 0–149)
VLDL Cholesterol Cal: 23 mg/dL (ref 5–40)

## 2015-10-20 LAB — BMP8+EGFR
BUN/Creatinine Ratio: 23 — ABNORMAL HIGH (ref 9–20)
BUN: 19 mg/dL (ref 6–24)
CALCIUM: 9.6 mg/dL (ref 8.7–10.2)
CO2: 20 mmol/L (ref 18–29)
CREATININE: 0.82 mg/dL (ref 0.76–1.27)
Chloride: 105 mmol/L (ref 96–106)
GFR, EST AFRICAN AMERICAN: 113 mL/min/{1.73_m2} (ref >59)
GFR, EST NON AFRICAN AMERICAN: 97 mL/min/{1.73_m2} (ref >59)
Glucose: 92 mg/dL (ref 65–99)
Potassium: 4.5 mmol/L (ref 3.5–5.2)
Sodium: 144 mmol/L (ref 134–144)

## 2015-10-22 ENCOUNTER — Other Ambulatory Visit: Payer: Self-pay | Admitting: Family Medicine

## 2015-11-10 ENCOUNTER — Other Ambulatory Visit: Payer: Self-pay | Admitting: Family Medicine

## 2015-12-07 ENCOUNTER — Other Ambulatory Visit: Payer: Self-pay

## 2015-12-07 MED ORDER — ATORVASTATIN CALCIUM 40 MG PO TABS: ORAL_TABLET | ORAL | Status: DC

## 2015-12-14 ENCOUNTER — Other Ambulatory Visit: Payer: Self-pay | Admitting: Family Medicine

## 2016-01-11 ENCOUNTER — Other Ambulatory Visit: Payer: Self-pay | Admitting: Family Medicine

## 2016-01-27 ENCOUNTER — Other Ambulatory Visit: Payer: Self-pay

## 2016-01-27 ENCOUNTER — Other Ambulatory Visit: Payer: Self-pay | Admitting: *Deleted

## 2016-01-27 MED ORDER — PRASUGREL HCL 10 MG PO TABS: 10.0000 mg | ORAL_TABLET | Freq: Every day | ORAL | Status: DC

## 2016-02-24 ENCOUNTER — Other Ambulatory Visit: Payer: Self-pay

## 2016-02-24 MED ORDER — VALACYCLOVIR HCL 500 MG PO TABS: 500.0000 mg | ORAL_TABLET | Freq: Every day | ORAL | Status: DC
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
## 2016-03-07 ENCOUNTER — Ambulatory Visit (INDEPENDENT_AMBULATORY_CARE_PROVIDER_SITE_OTHER): Payer: 59 | Admitting: Cardiology

## 2016-03-07 ENCOUNTER — Encounter: Payer: Self-pay | Admitting: Cardiology

## 2016-03-07 VITALS — BP 120/82 | HR 60 | Ht 70.0 in | Wt 254.0 lb

## 2016-03-07 DIAGNOSIS — I10 Essential (primary) hypertension: Secondary | ICD-10-CM | POA: Diagnosis not present

## 2016-03-07 DIAGNOSIS — I2510 Atherosclerotic heart disease of native coronary artery without angina pectoris: Secondary | ICD-10-CM | POA: Diagnosis not present

## 2016-03-07 MED ORDER — LISINOPRIL 5 MG PO TABS: ORAL_TABLET | ORAL | 3 refills | Status: DC

## 2016-03-07 MED ORDER — PRASUGREL HCL 10 MG PO TABS: 10.0000 mg | ORAL_TABLET | Freq: Every day | ORAL | 3 refills | Status: DC

## 2016-03-07 MED ORDER — METOPROLOL SUCCINATE ER 50 MG PO TB24: ORAL_TABLET | ORAL | 3 refills | Status: DC

## 2016-03-07 NOTE — Patient Instructions (Signed)
Medication Instructions:  The current medical regimen is effective;  continue present plan and medications.  Follow-Up: Follow up in 1 year with Dr. Hochrein in [place].  You will receive a letter in the mail 2 months before you are due.  Please call us when you receive this letter to schedule your follow up appointment.  If you need a refill on your cardiac medications before your next appointment, please call your pharmacy.  Thank you for choosing [facility]!!     

## 2016-03-08 NOTE — Addendum Note (Signed)
Addended by: Sharin Grave on: 03/08/2016 04:17 PM   Modules accepted: Orders

## 2016-03-14 ENCOUNTER — Ambulatory Visit: Payer: 59 | Admitting: Family Medicine

## 2016-03-21 ENCOUNTER — Encounter: Payer: Self-pay | Admitting: Family Medicine

## 2016-03-21 ENCOUNTER — Other Ambulatory Visit: Payer: Self-pay | Admitting: Family Medicine

## 2016-03-21 ENCOUNTER — Ambulatory Visit (INDEPENDENT_AMBULATORY_CARE_PROVIDER_SITE_OTHER): Payer: 59 | Admitting: Family Medicine

## 2016-03-21 VITALS — BP 110/72 | HR 68 | Temp 97.7°F | Ht 70.0 in | Wt 254.0 lb

## 2016-03-21 DIAGNOSIS — W57XXXA Bitten or stung by nonvenomous insect and other nonvenomous arthropods, initial encounter: Secondary | ICD-10-CM

## 2016-03-21 DIAGNOSIS — E8881 Metabolic syndrome: Secondary | ICD-10-CM

## 2016-03-21 DIAGNOSIS — Z8249 Family history of ischemic heart disease and other diseases of the circulatory system: Secondary | ICD-10-CM

## 2016-03-21 DIAGNOSIS — Z0000 Encounter for general adult medical examination without abnormal findings: Secondary | ICD-10-CM

## 2016-03-21 DIAGNOSIS — I10 Essential (primary) hypertension: Secondary | ICD-10-CM

## 2016-03-21 DIAGNOSIS — Z1211 Encounter for screening for malignant neoplasm of colon: Secondary | ICD-10-CM

## 2016-03-21 DIAGNOSIS — I2510 Atherosclerotic heart disease of native coronary artery without angina pectoris: Secondary | ICD-10-CM

## 2016-03-21 DIAGNOSIS — E782 Mixed hyperlipidemia: Secondary | ICD-10-CM

## 2016-03-21 DIAGNOSIS — E559 Vitamin D deficiency, unspecified: Secondary | ICD-10-CM

## 2016-03-21 LAB — URINALYSIS, COMPLETE
Bilirubin, UA: NEGATIVE
Glucose, UA: NEGATIVE
Ketones, UA: NEGATIVE
NITRITE UA: NEGATIVE
PH UA: 5.5 (ref 5.0–7.5)
Protein, UA: NEGATIVE
Specific Gravity, UA: 1.02 (ref 1.005–1.030)
Urobilinogen, Ur: 0.2 mg/dL (ref 0.2–1.0)

## 2016-03-21 LAB — MICROSCOPIC EXAMINATION

## 2016-03-21 MED ORDER — CEPHALEXIN 500 MG PO CAPS: 500.0000 mg | ORAL_CAPSULE | Freq: Three times a day (TID) | ORAL | 0 refills | Status: DC

## 2016-03-21 MED ORDER — CLOTRIMAZOLE-BETAMETHASONE 1-0.05 % EX CREA: 1.0000 "application " | TOPICAL_CREAM | Freq: Two times a day (BID) | CUTANEOUS | 3 refills | Status: DC

## 2016-03-21 MED ORDER — MUPIROCIN 2 % EX OINT: 1.0000 "application " | TOPICAL_OINTMENT | Freq: Two times a day (BID) | CUTANEOUS | 0 refills | Status: DC

## 2016-03-21 NOTE — Patient Instructions (Addendum)
Continue current medications. Continue good therapeutic lifestyle changes which include good diet and exercise. Fall precautions discussed with patient. If an FOBT was given today- please return it to our front desk. If you are over 59 years old - you may need Prevnar 13 or the adult Pneumonia vaccine.   After your visit with us today you will receive a survey in the mail or online from [employer] regarding your care with us. Please take a moment to fill this out. Your feedback is very important to us as you can help us better understand your patient needs as well as improve your experience and satisfaction. WE CARE ABOUT YOU!!!   Continue with current medicine and as aggressive therapeutic lifestyle changes as possible  Follow-up with cardiology as planned one year from the previous visit Hopefully the LDL C will be 70 or less and if it is not we will change statin drug to Crestor as recommended by the cardiologist Continue to drink plenty of fluids and stay well hydrated the summer. Take antibiotic as directed and apply antibiotic ointment sparingly twice daily to blistered areas on both feet Return to clinic for tetanus shot when rash is cleared and blisters are no longer present The patient needs an eye exam and we will also schedule him to see the gastroenterologist for a colonoscopy.

## 2016-03-21 NOTE — Progress Notes (Signed)
 Subjective:    Patient ID: [name], male    DOB: 09/22/1955, 60 y.o.   MRN: [id number]  HPI Patient is here today for annual wellness exam and follow up of chronic medical problems which includes hypertension and hyperlipidemia. He is taking medications regularly.The patient is doing well overall. He has a history of cardiovascular disease and MI in the past. He is currently seeing a cardiologist on a yearly basis. His most recent visit was a good 1. The cardiologist is concerned about getting his LDL C below 70. The patient is due for a tetanus update. He has never had a colonoscopy. We will encourage him to get schedule for this today. The patient denies any chest pain tightness pressure or shortness of breath. He has no trouble with his GI tract since he's been taking omeprazole as far as his heartburn is concerned. He takes this for reflux. His bowel habits are irregular and have been irregular for some time. He attributes this to his schedule at work and bearing schedule at work and having to work at nighttime. Sometimes she is constipated sometimes he has loose bowel movements. He denies any blood in the stool or black tarry bowel movements. He has never had a colonoscopy. He has no trouble passing his water burning pain or frequency. The rash on his feet was entirely under his socks. This was after he went in the yard one evening and had multiple bites under the socks of both feet. He is had blisters secondary to these bites. There is no significant erythema other than with each bite. One blisters asked he ruptured.     Patient Active Problem List   Diagnosis Date Noted  . Family history of heart disease 10/19/2015  . Vitamin D deficiency 03/24/2015  . Metabolic syndrome 03/24/2015  . Hyperlipidemia, mixed 02/09/2015  . OBESITY, UNSPECIFIED 09/20/2010  . TOBACCO ABUSE 09/20/2010  . Essential hypertension 09/20/2010  . Coronary atherosclerosis 09/20/2010  . GERD 09/20/2010  .  RENAL CALCULUS, HX OF 09/20/2010   Outpatient Encounter Prescriptions as of 03/21/2016  Medication Sig  . acetaminophen (TYLENOL) 325 MG tablet Take 650 mg by mouth every 6 (six) hours as needed.    . aspirin 81 MG tablet Take 1 tablet (81 mg total) by mouth daily.  . atorvastatin (LIPITOR) 40 MG tablet TAKE 1 TABLET (40 MG TOTAL) BY MOUTH DAILY.  . Calcium Carbonate-Vitamin D (CALCIUM-VITAMIN D) 500-200 MG-UNIT per tablet Take 1 tablet by mouth daily.  . lisinopril (PRINIVIL,ZESTRIL) 5 MG tablet TAKE 1 TABLET (5 MG TOTAL) BY MOUTH DAILY.  . metoprolol succinate (TOPROL-XL) 50 MG 24 hr tablet TAKE 1 TABLET (50 MG TOTAL) BY MOUTH DAILY. TAKE WITH OR IMMEDIATELY FOLLOWING A MEAL.  . nitroGLYCERIN (NITROSTAT) 0.4 MG SL tablet Place 1 tablet (0.4 mg total) under the tongue every 5 (five) minutes as needed.  . Omega-3 Fatty Acids (FISH OIL) 1000 MG CAPS Take 2 capsules (2,000 mg total) by mouth daily.  . omeprazole (PRILOSEC) 40 MG capsule TAKE 1 CAPSULE BY MOUTH ONCE DAILY  . prasugrel (EFFIENT) 10 MG TABS tablet Take 1 tablet (10 mg total) by mouth daily.  . valACYclovir (VALTREX) 500 MG tablet Take 1 tablet (500 mg total) by mouth daily.  . Vitamin D, Ergocalciferol, (DRISDOL) 50000 units CAPS capsule TAKE 1 CAPSULE (50,000 UNITS TOTAL) BY MOUTH EVERY 7 (SEVEN) DAYS.   No facility-administered encounter medications on file as of 03/21/2016.       Review of Systems    Constitutional: Negative.   HENT: Negative.   Eyes: Negative.   Respiratory: Negative.   Cardiovascular: Negative.   Gastrointestinal: Negative.   Endocrine: Negative.   Genitourinary: Negative.   Musculoskeletal: Negative.   Skin: Positive for rash (blisters and rash on feet).  Allergic/Immunologic: Negative.   Neurological: Negative.   Hematological: Negative.   Psychiatric/Behavioral: Negative.        Objective:   Physical Exam  Constitutional: He is oriented to person, place, and time. He appears well-developed and  well-nourished. No distress.  HENT:  Head: Normocephalic and atraumatic.  Right Ear: External ear normal.  Left Ear: External ear normal.  Nose: Nose normal.  Mouth/Throat: Oropharynx is clear and moist. No oropharyngeal exudate.  Eyes: Conjunctivae and EOM are normal. Pupils are equal, round, and reactive to light. Right eye exhibits no discharge. Left eye exhibits no discharge. No scleral icterus.  Patient needs an eye exam and he is encouraged to get this.  Neck: Normal range of motion. Neck supple. No thyromegaly present.  No bruits thyromegaly or anterior cervical adenopathy  Cardiovascular: Normal rate, regular rhythm, normal heart sounds and intact distal pulses.   No murmur heard. The heart has a regular rate and rhythm at 72/m  Pulmonary/Chest: Effort normal and breath sounds normal. No respiratory distress. He has no wheezes. He has no rales. He exhibits no tenderness.  Clear anteriorly and posteriorly and no axillary adenopathy  Abdominal: Soft. Bowel sounds are normal. He exhibits no mass. There is no tenderness. There is no rebound and no guarding.  Abdominal obesity without masses tenderness or bruits  Genitourinary: Rectum normal and penis normal.  Genitourinary Comments: The prostate is enlarged but soft and smooth.  Musculoskeletal: Normal range of motion. He exhibits no edema or tenderness.  Lymphadenopathy:    He has no cervical adenopathy.  Neurological: He is alert and oriented to person, place, and time. He has normal reflexes. No cranial nerve deficit.  Skin: Skin is warm and dry. Rash noted. No erythema.  Multiple bites on both feet from the ankles down from mites that he also received last year.  Psychiatric: He has a normal mood and affect. His behavior is normal. Judgment and thought content normal.  Nursing note and vitals reviewed.  BP 110/72 (BP Location: Right Arm)   Pulse 68   Temp 97.7 F (36.5 C) (Oral)   Ht 5' 10" (1.778 m)   Wt 254 lb (115.2 kg)    BMI 36.45 kg/m         Assessment & Plan:  1. Annual physical exam -Patient needs colonoscopy and eye exam. -He will return to the clinic for a tetanus shot as soon as the rash clears on his or extremities - BMP8+EGFR - CBC with Differential/Platelet - Hepatic function panel - VITAMIN D 25 Hydroxy (Vit-D Deficiency, Fractures) - NMR, lipoprofile - PSA, total and free - Urinalysis, Complete  2. Hyperlipidemia, mixed -Continue with aggressive therapeutic lifestyle changes and an LDL C that is less than 70 - CBC with Differential/Platelet - NMR, lipoprofile  3. Essential hypertension -The blood pressure is good today and he will continue with current treatment - BMP8+EGFR - CBC with Differential/Platelet - Hepatic function panel  4. Atherosclerosis of native coronary artery of native heart without angina pectoris -Continue with aggressive therapeutic lifestyle changes and statin therapy pending results of lab work - CBC with Differential/Platelet  5. Vitamin D deficiency -Continue with current treatment pending results of lab work - CBC with Differential/Platelet - VITAMIN   D 25 Hydroxy (Vit-D Deficiency, Fractures)  6. Family history of heart disease -There is a strong history of heart disease and patient should continue regular follow-ups with the cardiologist - CBC with Differential/Platelet  7. Metabolic syndrome -Continue weight loss and aggressive therapeutic lifestyle changes - CBC with Differential/Platelet  8. Screen for colon cancer - Ambulatory referral to Gastroenterology  9. Insect bites -Take antibiotic as directed and use appointment as directed  Patient Instructions  Continue current medications. Continue good therapeutic lifestyle changes which include good diet and exercise. Fall precautions discussed with patient. If an FOBT was given today- please return it to our front desk. If you are over 27 years old - you may need Prevnar 50 or the  adult Pneumonia vaccine.   After your visit with [place] today you will receive a survey in the mail or online from [employer] regarding your care with [place]. Please take a moment to fill this out. Your feedback is very important to [place] as you can help [place] better understand your patient needs as well as improve your experience and satisfaction. WE CARE ABOUT YOU!!!   Continue with current medicine and as aggressive therapeutic lifestyle changes as possible  Follow-up with cardiology as planned one year from the previous visit Hopefully the LDL C will be 70 or less and if it is not we will change statin drug to Crestor as recommended by the cardiologist Continue to drink plenty of fluids and stay well hydrated the summer. Take antibiotic as directed and apply antibiotic ointment sparingly twice daily to blistered areas on both feet Return to clinic for tetanus shot when rash is cleared and blisters are no longer present The patient needs an eye exam and we will also schedule him to see the gastroenterologist for a colonoscopy.  Arrie Senate MD

## 2016-03-22 LAB — BMP8+EGFR
BUN/Creatinine Ratio: 26 — ABNORMAL HIGH (ref 9–20)
BUN: 20 mg/dL (ref 6–24)
CALCIUM: 9.6 mg/dL (ref 8.7–10.2)
CHLORIDE: 100 mmol/L (ref 96–106)
CO2: 21 mmol/L (ref 18–29)
CREATININE: 0.77 mg/dL (ref 0.76–1.27)
GFR calc Af Amer: 115 mL/min/{1.73_m2} (ref >59)
GFR calc non Af Amer: 99 mL/min/{1.73_m2} (ref >59)
GLUCOSE: 115 mg/dL — AB (ref 65–99)
Potassium: 4.8 mmol/L (ref 3.5–5.2)
Sodium: 138 mmol/L (ref 134–144)

## 2016-03-22 LAB — CBC WITH DIFFERENTIAL/PLATELET
BASOS ABS: 0.1 10*3/uL (ref 0.0–0.2)
Basos: 1 %
EOS (ABSOLUTE): 0.3 10*3/uL (ref 0.0–0.4)
EOS: 2 %
HEMATOCRIT: 49.5 % (ref 37.5–51.0)
Hemoglobin: 16.7 g/dL (ref 12.6–17.7)
IMMATURE GRANULOCYTES: 1 %
Immature Grans (Abs): 0.1 10*3/uL (ref 0.0–0.1)
LYMPHS: 36 %
Lymphocytes Absolute: 5.2 10*3/uL — ABNORMAL HIGH (ref 0.7–3.1)
MCH: 30.7 pg (ref 26.6–33.0)
MCHC: 33.7 g/dL (ref 31.5–35.7)
MCV: 91 fL (ref 79–97)
MONOCYTES: 12 %
Monocytes Absolute: 1.7 10*3/uL — ABNORMAL HIGH (ref 0.1–0.9)
NEUTROS PCT: 48 %
Neutrophils Absolute: 7.1 10*3/uL — ABNORMAL HIGH (ref 1.4–7.0)
Platelets: 320 10*3/uL (ref 150–379)
RBC: 5.44 x10E6/uL (ref 4.14–5.80)
RDW: 15.7 % — AB (ref 12.3–15.4)
WBC: 14.6 10*3/uL — AB (ref 3.4–10.8)

## 2016-03-22 LAB — HEPATIC FUNCTION PANEL
ALBUMIN: 4.1 g/dL (ref 3.5–5.5)
ALK PHOS: 127 IU/L — AB (ref 39–117)
ALT: 37 IU/L (ref 0–44)
AST: 25 IU/L (ref 0–40)
BILIRUBIN TOTAL: 0.2 mg/dL (ref 0.0–1.2)
Bilirubin, Direct: 0.08 mg/dL (ref 0.00–0.40)
Total Protein: 6.9 g/dL (ref 6.0–8.5)

## 2016-03-22 LAB — NO SPECIMEN RECEIVED

## 2016-03-22 LAB — NMR, LIPOPROFILE

## 2016-03-22 LAB — PSA, TOTAL AND FREE
PSA, Free Pct: 14.4 %
PSA, Free: 0.13 ng/mL
Prostate Specific Ag, Serum: 0.9 ng/mL (ref 0.0–4.0)

## 2016-03-22 LAB — VITAMIN D 25 HYDROXY (VIT D DEFICIENCY, FRACTURES): Vit D, 25-Hydroxy: 34.9 ng/mL (ref 30.0–100.0)

## 2016-03-25 LAB — NMR, LIPOPROFILE
Cholesterol: 149 mg/dL (ref 100–199)
HDL Cholesterol by NMR: 38 mg/dL — ABNORMAL LOW (ref >39)
HDL Particle Number: 20.6 umol/L — ABNORMAL LOW (ref >=30.5)
LDL Particle Number: 1324 nmol/L — ABNORMAL HIGH (ref <1000)
LDL SIZE: 20.8 nm (ref >20.5)
LDL-C: 86 mg/dL (ref 0–99)
LP-IR SCORE: 66 — AB (ref <=45)
Small LDL Particle Number: 540 nmol/L — ABNORMAL HIGH (ref <=527)
Triglycerides by NMR: 126 mg/dL (ref 0–149)

## 2016-03-25 LAB — SPECIMEN STATUS REPORT

## 2016-04-18 ENCOUNTER — Other Ambulatory Visit: Payer: Self-pay | Admitting: *Deleted

## 2016-04-18 DIAGNOSIS — Z87442 Personal history of urinary calculi: Secondary | ICD-10-CM

## 2016-04-21 ENCOUNTER — Other Ambulatory Visit: Payer: Self-pay | Admitting: Family Medicine

## 2016-04-23 ENCOUNTER — Encounter: Payer: Self-pay | Admitting: Family Medicine

## 2016-05-11 ENCOUNTER — Other Ambulatory Visit (INDEPENDENT_AMBULATORY_CARE_PROVIDER_SITE_OTHER): Payer: 59

## 2016-05-11 DIAGNOSIS — Z139 Encounter for screening, unspecified: Secondary | ICD-10-CM

## 2016-05-11 DIAGNOSIS — Z87442 Personal history of urinary calculi: Secondary | ICD-10-CM

## 2016-05-11 DIAGNOSIS — Z23 Encounter for immunization: Secondary | ICD-10-CM | POA: Diagnosis not present

## 2016-05-11 LAB — URINALYSIS, COMPLETE
BILIRUBIN UA: NEGATIVE
GLUCOSE, UA: NEGATIVE
Ketones, UA: NEGATIVE
Nitrite, UA: NEGATIVE
PROTEIN UA: NEGATIVE
Specific Gravity, UA: 1.02 (ref 1.005–1.030)
UUROB: 0.2 mg/dL (ref 0.2–1.0)
pH, UA: 5.5 (ref 5.0–7.5)

## 2016-05-11 LAB — MICROSCOPIC EXAMINATION: Bacteria, UA: NONE SEEN

## 2016-05-12 LAB — CBC WITH DIFFERENTIAL/PLATELET
Basophils Absolute: 0 10*3/uL (ref 0.0–0.2)
Basos: 0 %
EOS (ABSOLUTE): 0.1 10*3/uL (ref 0.0–0.4)
EOS: 1 %
Hematocrit: 47 % (ref 37.5–51.0)
Hemoglobin: 16.4 g/dL (ref 12.6–17.7)
IMMATURE GRANULOCYTES: 0 %
Immature Grans (Abs): 0 10*3/uL (ref 0.0–0.1)
Lymphocytes Absolute: 4.9 10*3/uL — ABNORMAL HIGH (ref 0.7–3.1)
Lymphs: 51 %
MCH: 31.4 pg (ref 26.6–33.0)
MCHC: 34.9 g/dL (ref 31.5–35.7)
MCV: 90 fL (ref 79–97)
MONOS ABS: 1.1 10*3/uL — AB (ref 0.1–0.9)
Monocytes: 11 %
NEUTROS PCT: 37 %
Neutrophils Absolute: 3.7 10*3/uL (ref 1.4–7.0)
PLATELETS: 261 10*3/uL (ref 150–379)
RBC: 5.22 x10E6/uL (ref 4.14–5.80)
RDW: 15.1 % (ref 12.3–15.4)
WBC: 9.8 10*3/uL (ref 3.4–10.8)

## 2016-05-12 LAB — HEPATIC FUNCTION PANEL
ALK PHOS: 127 IU/L — AB (ref 39–117)
ALT: 29 IU/L (ref 0–44)
AST: 20 IU/L (ref 0–40)
Albumin: 4 g/dL (ref 3.5–5.5)
Bilirubin Total: 0.2 mg/dL (ref 0.0–1.2)
Bilirubin, Direct: 0.09 mg/dL (ref 0.00–0.40)
Total Protein: 6.8 g/dL (ref 6.0–8.5)

## 2016-05-14 ENCOUNTER — Telehealth: Payer: Self-pay | Admitting: Family Medicine

## 2016-05-14 NOTE — Telephone Encounter (Signed)
Patient aware of results and recommendation.

## 2016-05-15 LAB — FECAL OCCULT BLOOD, IMMUNOCHEMICAL: Fecal Occult Bld: NEGATIVE

## 2016-05-26 ENCOUNTER — Other Ambulatory Visit: Payer: Self-pay | Admitting: Family Medicine

## 2016-06-06 IMAGING — CR DG CHEST 2V
2 series · 2 of 2 positions shown · non-contrast
Comparison: Portable chest x-ray October 21, 2013

CLINICAL DATA: Hypertension, coronary artery disease,
hyperlipidemia.

EXAM:
CHEST  2 VIEW

[view not recorded (1 of 2)]
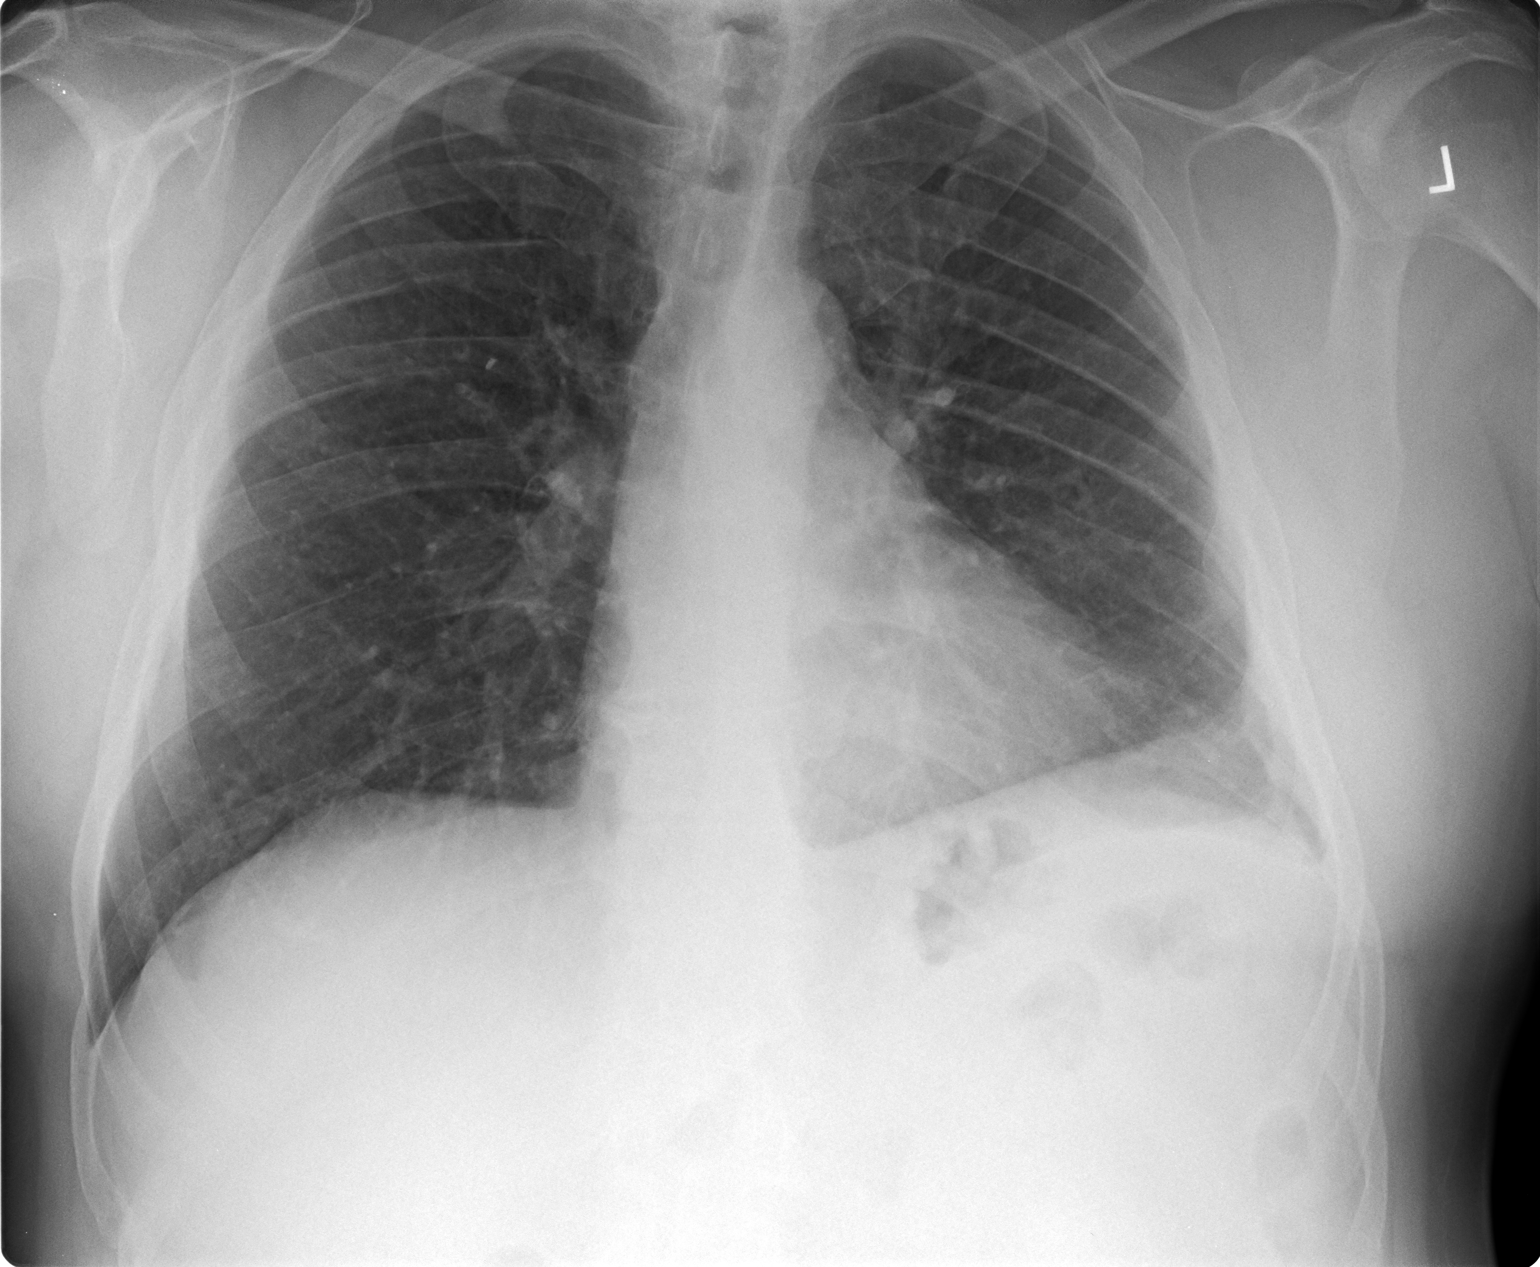

[view not recorded (2 of 2)]
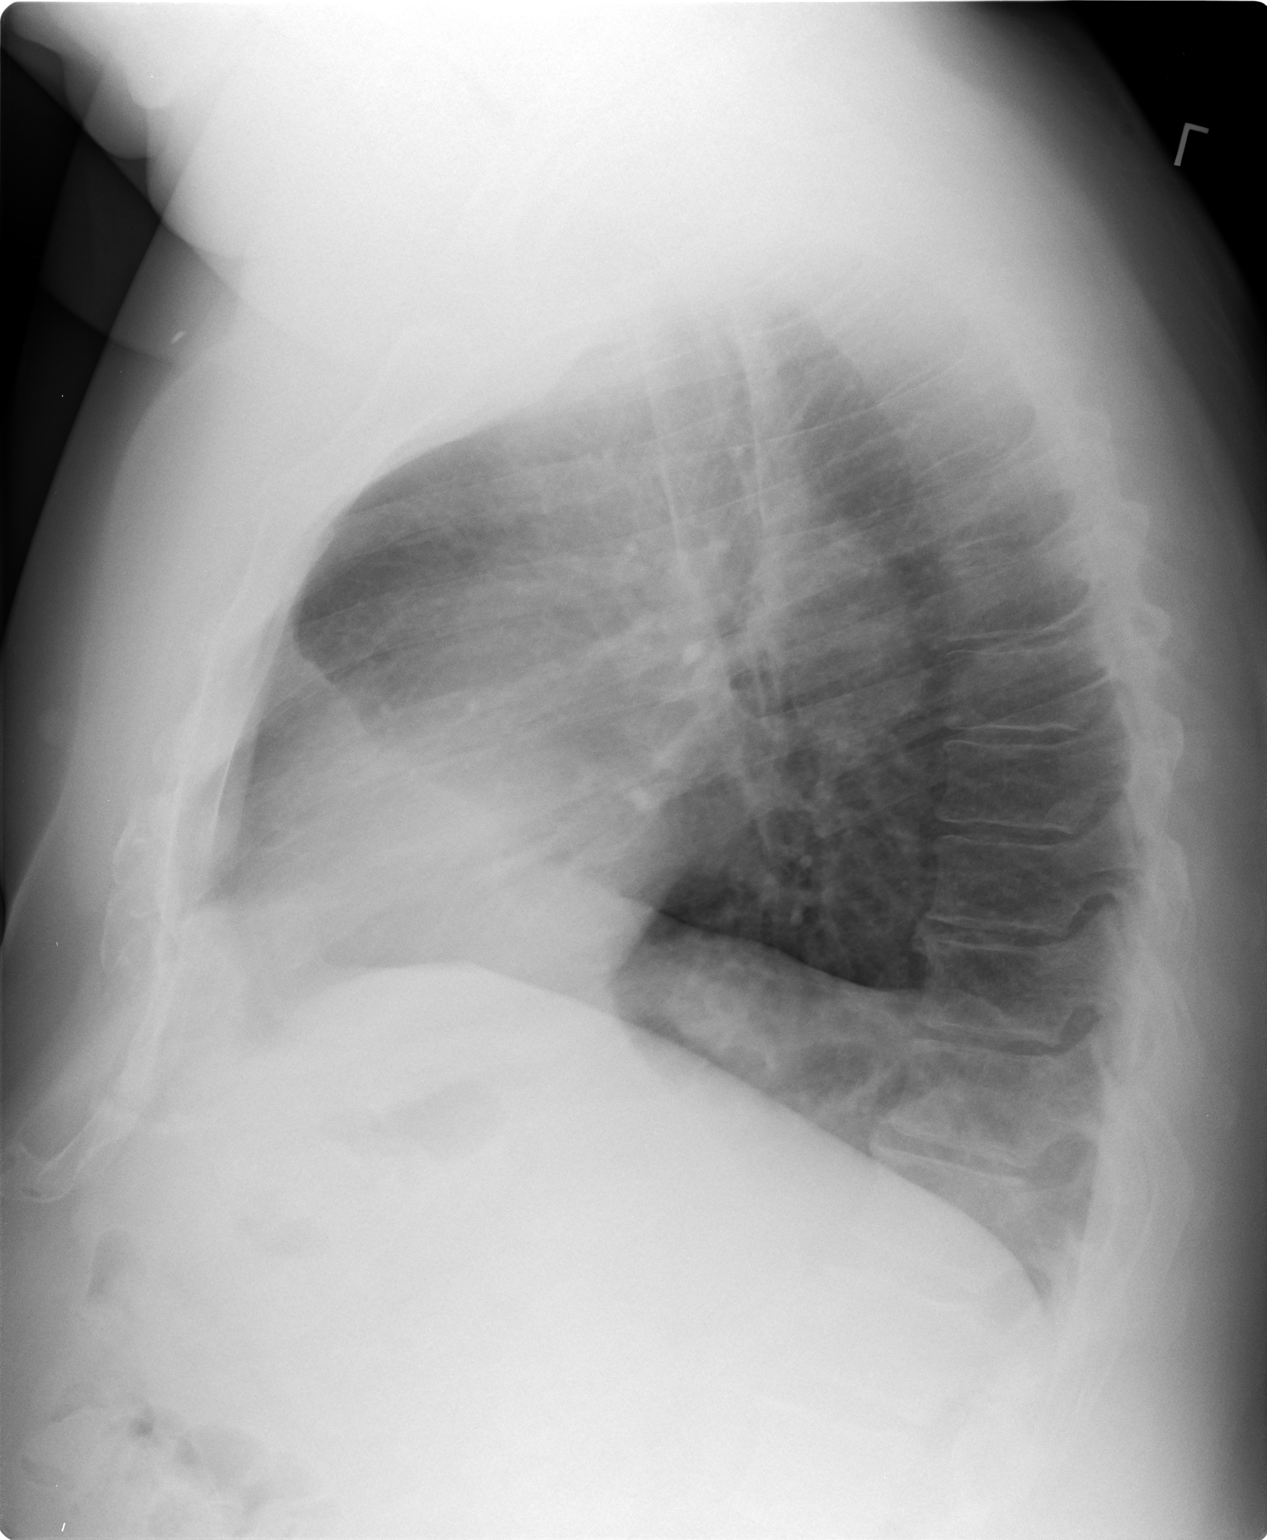

[2 of 2 positions shown; findings below may reference images not displayed]

FINDINGS: The right lung is well-expanded and clear. On the left there is
stable volume loss posterior laterally. No pulmonary parenchymal
nodules or masses are observed. The heart and pulmonary vascularity
are normal. The mediastinum is normal in width. The bony thorax
exhibits no acute abnormality.
IMPRESSION: Stable volume loss at the left lung base likely reflecting scarring.
There is no acute cardiopulmonary abnormality.

## 2016-08-23 ENCOUNTER — Other Ambulatory Visit: Payer: Self-pay | Admitting: Family Medicine

## 2016-08-27 ENCOUNTER — Other Ambulatory Visit: Payer: Self-pay | Admitting: Family Medicine

## 2016-10-03 ENCOUNTER — Ambulatory Visit: Payer: 59 | Admitting: Family Medicine

## 2016-10-18 ENCOUNTER — Other Ambulatory Visit: Payer: Self-pay | Admitting: Family Medicine

## 2016-11-30 ENCOUNTER — Other Ambulatory Visit: Payer: Self-pay | Admitting: Family Medicine

## 2017-02-19 ENCOUNTER — Encounter: Payer: Self-pay | Admitting: *Deleted

## 2017-02-22 ENCOUNTER — Other Ambulatory Visit: Payer: Self-pay | Admitting: Family Medicine

## 2017-02-24 ENCOUNTER — Other Ambulatory Visit: Payer: Self-pay | Admitting: Cardiology

## 2017-03-09 ENCOUNTER — Other Ambulatory Visit: Payer: Self-pay | Admitting: Family Medicine

## 2017-03-12 NOTE — Telephone Encounter (Signed)
Last Seen and last lipid 03/21/16  DWM

## 2017-03-22 ENCOUNTER — Ambulatory Visit: Payer: 59 | Admitting: Family Medicine

## 2017-03-25 ENCOUNTER — Ambulatory Visit: Payer: 59 | Admitting: Family Medicine

## 2017-03-26 ENCOUNTER — Ambulatory Visit (INDEPENDENT_AMBULATORY_CARE_PROVIDER_SITE_OTHER): Payer: 59

## 2017-03-26 ENCOUNTER — Other Ambulatory Visit: Payer: Self-pay | Admitting: Cardiology

## 2017-03-26 ENCOUNTER — Encounter: Payer: Self-pay | Admitting: Physician Assistant

## 2017-03-26 ENCOUNTER — Ambulatory Visit (INDEPENDENT_AMBULATORY_CARE_PROVIDER_SITE_OTHER): Payer: 59 | Admitting: Physician Assistant

## 2017-03-26 VITALS — BP 121/79 | HR 76 | Temp 97.0°F | Ht 70.0 in | Wt 263.2 lb

## 2017-03-26 DIAGNOSIS — M545 Low back pain, unspecified: Secondary | ICD-10-CM

## 2017-03-26 MED ORDER — PREDNISONE 10 MG (21) PO TBPK: ORAL_TABLET | ORAL | 0 refills | Status: DC

## 2017-03-26 MED ORDER — CYCLOBENZAPRINE HCL 10 MG PO TABS: 10.0000 mg | ORAL_TABLET | Freq: Three times a day (TID) | ORAL | 0 refills | Status: DC | PRN

## 2017-03-26 MED ORDER — METHYLPREDNISOLONE ACETATE 80 MG/ML IJ SUSP
80.0000 mg | Freq: Once | INTRAMUSCULAR | Status: AC
  Administered 2017-03-26: 80 mg via INTRAMUSCULAR
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
## 2017-03-26 NOTE — Patient Instructions (Signed)
Low Back Sprain  A sprain is a stretch or tear in the bands of tissue that hold bones and joints together (ligaments). Sprains of the lower back (lumbar spine) are a common cause of low back pain. A sprain occurs when ligaments are overextended or stretched beyond their limits. The ligaments can become inflamed, resulting in pain and sudden muscle tightening (spasms). A sprain can be caused by an injury (trauma), or it can develop gradually due to overuse.  There are three types of sprains:  · Grade 1 is a mild sprain involving an overstretched ligament or a very slight tear of the ligament.  · Grade 2 is a moderate sprain involving a partial tear of the ligament.  · Grade 3 is a severe sprain involving a complete tear of the ligament.    What are the causes?  This condition may be caused by:  · Trauma, such as a fall or a hit to the body.  · Twisting or overstretching the back. This may result from doing activities that require a lot of energy, such as lifting heavy objects.    What increases the risk?  The following factors may increase your risk of getting this condition:  · Playing contact sports.  · Participating in sports or activities that put excessive stress on the back and require a lot of bending and twisting, including:  ? Lifting weights or heavy objects.  ? Gymnastics.  ? Soccer.  ? Figure skating.  ? Snowboarding.  · Being overweight or obese.  · Having poor strength and flexibility.    What are the signs or symptoms?  Symptoms of this condition may include:  · Sharp or dull pain in the lower back that does not go away. Pain may extend to the buttocks.  · Stiffness.  · Limited range of motion.  · Inability to stand up straight due to stiffness or pain.  · Muscle spasms.    How is this diagnosed?    This condition may be diagnosed based on:  · Your symptoms.  · Your medical history.  · A physical exam.  ? Your health care provider may push on certain areas of your back to determine the source of your  pain.  ? You may be asked to bend forward, backward, and side to side to assess the severity of your pain and your range of motion.  · Imaging tests, such as:  ? X-rays.  ? MRI.    How is this treated?  Treatment for this condition may include:  · Applying heat and cold to the affected area.  · Medicines to help relieve pain and to relax your muscles (muscle relaxants).  · NSAIDs to help reduce swelling and discomfort.  · Physical therapy.    When your symptoms improve, it is important to gradually return to your normal routine as soon as possible to reduce pain, avoid stiffness, and avoid loss of muscle strength. Generally, symptoms should improve within 6 weeks of treatment. However, recovery time varies.  Follow these instructions at home:  Managing pain, stiffness, and swelling  · If directed, apply ice to the injured area during the first 24 hours after your injury.  ? Put ice in a plastic bag.  ? Place a towel between your skin and the bag.  ? Leave the ice on for 20 minutes, 2-3 times a day.  · If directed, apply heat to the affected area as often as told by your health care provider. Use the   heat source that your health care provider recommends, such as a moist heat pack or a heating pad.  ? Place a towel between your skin and the heat source.  ? Leave the heat on for 20-30 minutes.  ? Remove the heat if your skin turns bright red. This is especially important if you are unable to feel pain, heat, or cold. You may have a greater risk of getting burned.  Activity  · Rest and return to your normal activities as told by your health care provider. Ask your health care provider what activities are safe for you.  · Avoid activities that take a lot of effort (are strenuous) for as long as told by your health care provider.  · Do exercises as told by your health care provider.  General instructions    · Take over-the-counter and prescription medicines only as told by your health care provider.  · If you have  questions or concerns about safety while taking pain medicine, talk with your health care provider.  · Do not drive or operate heavy machinery until you know how your pain medicine affects you.  · Do not use any tobacco products, such as cigarettes, chewing tobacco, and e-cigarettes. Tobacco can delay bone healing. If you need help quitting, ask your health care provider.  · Keep all follow-up visits as told by your health care provider. This is important.  How is this prevented?  · Warm up and stretch before being active.  · Cool down and stretch after being active.  · Give your body time to rest between periods of activity.  · Avoid:  ? Being physically inactive for long periods at a time.  ? Exercising or playing sports when you are tired or in pain.  · Use correct form when playing sports and lifting heavy objects.  · Use good posture when sitting and standing.  · Maintain a healthy weight.  · Sleep on a mattress with medium firmness to support your back.  · Make sure to use equipment that fits you, including shoes that fit well.  · Be safe and responsible while being active to avoid falls.  · Do at least 150 minutes of moderate-intensity exercise each week, such as brisk walking or water aerobics. Try a form of exercise that takes stress off your back, such as swimming or stationary cycling.  · Maintain physical fitness, including:  ? Strength. In particular, develop and maintain strong abdominal muscles.  ? Flexibility.  ? Cardiovascular fitness.  ? Endurance.  Contact a health care provider if:  · Your back pain does not improve after 6 weeks of treatment.  · Your symptoms get worse.  Get help right away if:  · Your back pain is severe.  · You are unable to stand or walk.  · You develop pain in your legs.  · You develop weakness in your buttocks or legs.  · You have difficulty controlling when you urinate or when you have a bowel movement.  This information is not intended to replace advice given to you by  your health care provider. Make sure you discuss any questions you have with your health care provider.  Document Released: 07/30/2005 Document Revised: 04/05/2016 Document Reviewed: 05/11/2015  Elsevier Interactive Patient Education © 2018 [employer]

## 2017-03-28 ENCOUNTER — Other Ambulatory Visit: Payer: Self-pay | Admitting: Family Medicine

## 2017-04-09 ENCOUNTER — Other Ambulatory Visit: Payer: Self-pay | Admitting: Cardiology

## 2017-04-10 ENCOUNTER — Other Ambulatory Visit: Payer: Self-pay | Admitting: Cardiology

## 2017-04-17 ENCOUNTER — Other Ambulatory Visit: Payer: Self-pay | Admitting: Cardiology

## 2017-04-17 ENCOUNTER — Ambulatory Visit: Payer: 59 | Admitting: Family Medicine

## 2017-04-17 ENCOUNTER — Other Ambulatory Visit: Payer: Self-pay | Admitting: Family Medicine

## 2017-04-24 ENCOUNTER — Other Ambulatory Visit: Payer: Self-pay | Admitting: *Deleted

## 2017-04-24 MED ORDER — LISINOPRIL 5 MG PO TABS: 5.0000 mg | ORAL_TABLET | Freq: Every day | ORAL | 0 refills | Status: DC

## 2017-05-06 ENCOUNTER — Ambulatory Visit (INDEPENDENT_AMBULATORY_CARE_PROVIDER_SITE_OTHER): Payer: 59 | Admitting: Family Medicine

## 2017-05-06 ENCOUNTER — Encounter: Payer: Self-pay | Admitting: Family Medicine

## 2017-05-06 VITALS — BP 103/71 | HR 59 | Temp 97.2°F | Ht 70.0 in | Wt 259.0 lb

## 2017-05-06 DIAGNOSIS — E782 Mixed hyperlipidemia: Secondary | ICD-10-CM | POA: Diagnosis not present

## 2017-05-06 DIAGNOSIS — M545 Low back pain, unspecified: Secondary | ICD-10-CM

## 2017-05-06 DIAGNOSIS — K432 Incisional hernia without obstruction or gangrene: Secondary | ICD-10-CM

## 2017-05-06 DIAGNOSIS — E559 Vitamin D deficiency, unspecified: Secondary | ICD-10-CM

## 2017-05-06 DIAGNOSIS — Z0000 Encounter for general adult medical examination without abnormal findings: Secondary | ICD-10-CM | POA: Diagnosis not present

## 2017-05-06 DIAGNOSIS — E8881 Metabolic syndrome: Secondary | ICD-10-CM

## 2017-05-06 DIAGNOSIS — N400 Enlarged prostate without lower urinary tract symptoms: Secondary | ICD-10-CM

## 2017-05-06 DIAGNOSIS — Z1211 Encounter for screening for malignant neoplasm of colon: Secondary | ICD-10-CM

## 2017-05-06 DIAGNOSIS — I2510 Atherosclerotic heart disease of native coronary artery without angina pectoris: Secondary | ICD-10-CM

## 2017-05-06 DIAGNOSIS — Z8249 Family history of ischemic heart disease and other diseases of the circulatory system: Secondary | ICD-10-CM

## 2017-05-06 DIAGNOSIS — R8290 Unspecified abnormal findings in urine: Secondary | ICD-10-CM

## 2017-05-06 DIAGNOSIS — I10 Essential (primary) hypertension: Secondary | ICD-10-CM

## 2017-05-06 LAB — URINALYSIS, COMPLETE
BILIRUBIN UA: NEGATIVE
Glucose, UA: NEGATIVE
NITRITE UA: NEGATIVE
PH UA: 5.5 (ref 5.0–7.5)
UUROB: 0.2 mg/dL (ref 0.2–1.0)

## 2017-05-06 LAB — MICROSCOPIC EXAMINATION: Renal Epithel, UA: NONE SEEN /hpf

## 2017-05-06 MED ORDER — LISINOPRIL 5 MG PO TABS: 5.0000 mg | ORAL_TABLET | Freq: Every day | ORAL | 3 refills | Status: DC

## 2017-05-06 NOTE — Addendum Note (Signed)
Addended by: Magdalene River on: 05/06/2017 02:32 PM   Modules accepted: Orders

## 2017-05-06 NOTE — Addendum Note (Signed)
Addended by: Magdalene River on: 05/06/2017 08:44 AM   Modules accepted: Orders

## 2017-05-06 NOTE — Patient Instructions (Addendum)
Continue current medications. Continue good therapeutic lifestyle changes which include good diet and exercise. Fall precautions discussed with patient. If an FOBT was given today- please return it to our front desk. If you are over 60 years old - you may need Prevnar 13 or the adult Pneumonia vaccine.  **Flu shots are available--- please call and schedule a FLU-CLINIC appointment**  After your visit with [place] today you will receive a survey in the mail or online from [employer] regarding your care with [place]. Please take a moment to fill this out. Your feedback is very important to [place] as you can help [place] better understand your patient needs as well as improve your experience and satisfaction. WE CARE ABOUT YOU!!!   Please call and make an appointment to get your eyes examined and make sure we get a hard copy of that report We will arrange for you to have a colonoscopy make sure that you get scheduled to have your yearly cardiac visit Call the [place] office at [phone] - tell them to schedule you in the [facility] office!! Do not forget to get your flu shot and the should be done by the end of October.
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
## 2017-05-07 LAB — BMP8+EGFR
BUN / CREAT RATIO: 23 (ref 10–24)
BUN: 17 mg/dL (ref 8–27)
CHLORIDE: 106 mmol/L (ref 96–106)
CO2: 21 mmol/L (ref 20–29)
CREATININE: 0.74 mg/dL — AB (ref 0.76–1.27)
Calcium: 9.4 mg/dL (ref 8.6–10.2)
GFR calc non Af Amer: 100 mL/min/{1.73_m2} (ref >59)
GFR, EST AFRICAN AMERICAN: 116 mL/min/{1.73_m2} (ref >59)
GLUCOSE: 109 mg/dL — AB (ref 65–99)
Potassium: 4.5 mmol/L (ref 3.5–5.2)
SODIUM: 142 mmol/L (ref 134–144)

## 2017-05-07 LAB — CBC WITH DIFFERENTIAL/PLATELET
Basophils Absolute: 0.1 10*3/uL (ref 0.0–0.2)
Basos: 1 %
EOS (ABSOLUTE): 0.2 10*3/uL (ref 0.0–0.4)
EOS: 2 %
HEMATOCRIT: 48.3 % (ref 37.5–51.0)
HEMOGLOBIN: 17 g/dL (ref 13.0–17.7)
Immature Grans (Abs): 0 10*3/uL (ref 0.0–0.1)
Immature Granulocytes: 0 %
LYMPHS ABS: 4.1 10*3/uL — AB (ref 0.7–3.1)
Lymphs: 46 %
MCH: 33 pg (ref 26.6–33.0)
MCHC: 35.2 g/dL (ref 31.5–35.7)
MCV: 94 fL (ref 79–97)
MONOCYTES: 10 %
MONOS ABS: 0.9 10*3/uL (ref 0.1–0.9)
NEUTROS ABS: 3.6 10*3/uL (ref 1.4–7.0)
Neutrophils: 41 %
Platelets: 244 10*3/uL (ref 150–379)
RBC: 5.15 x10E6/uL (ref 4.14–5.80)
RDW: 15 % (ref 12.3–15.4)
WBC: 8.8 10*3/uL (ref 3.4–10.8)

## 2017-05-07 LAB — VITAMIN D 25 HYDROXY (VIT D DEFICIENCY, FRACTURES): Vit D, 25-Hydroxy: 33.2 ng/mL (ref 30.0–100.0)

## 2017-05-07 LAB — PSA, TOTAL AND FREE
PSA, Free Pct: 9.5 %
PSA, Free: 0.19 ng/mL
Prostate Specific Ag, Serum: 2 ng/mL (ref 0.0–4.0)

## 2017-05-07 LAB — LIPID PANEL
CHOL/HDL RATIO: 3.4 ratio (ref 0.0–5.0)
Cholesterol, Total: 148 mg/dL (ref 100–199)
HDL: 44 mg/dL (ref >39)
LDL CALC: 78 mg/dL (ref 0–99)
Triglycerides: 129 mg/dL (ref 0–149)
VLDL Cholesterol Cal: 26 mg/dL (ref 5–40)

## 2017-05-07 LAB — HEPATIC FUNCTION PANEL
ALBUMIN: 4.1 g/dL (ref 3.6–4.8)
ALK PHOS: 123 IU/L — AB (ref 39–117)
ALT: 28 IU/L (ref 0–44)
AST: 22 IU/L (ref 0–40)
BILIRUBIN, DIRECT: 0.13 mg/dL (ref 0.00–0.40)
Bilirubin Total: 0.4 mg/dL (ref 0.0–1.2)
Total Protein: 6.5 g/dL (ref 6.0–8.5)

## 2017-05-07 LAB — URINE CULTURE

## 2017-06-05 ENCOUNTER — Encounter: Payer: Self-pay | Admitting: Family Medicine

## 2017-06-10 ENCOUNTER — Other Ambulatory Visit: Payer: Self-pay | Admitting: Family Medicine

## 2017-07-08 ENCOUNTER — Other Ambulatory Visit: Payer: Self-pay | Admitting: Cardiology

## 2017-07-08 NOTE — Telephone Encounter (Signed)
REFILL 

## 2017-07-16 ENCOUNTER — Other Ambulatory Visit: Payer: Self-pay | Admitting: Family Medicine

## 2017-08-02 ENCOUNTER — Emergency Department (HOSPITAL_COMMUNITY)
Admission: EM | Admit: 2017-08-02 | Discharge: 2017-08-03 | Disposition: A | Discharge status: Home / Self Care | Payer: 59 | Attending: Emergency Medicine | Admitting: Emergency Medicine

## 2017-08-02 ENCOUNTER — Encounter (HOSPITAL_COMMUNITY): Payer: Self-pay | Admitting: Emergency Medicine

## 2017-08-02 DIAGNOSIS — I10 Essential (primary) hypertension: Secondary | ICD-10-CM | POA: Diagnosis not present

## 2017-08-02 DIAGNOSIS — Z7982 Long term (current) use of aspirin: Secondary | ICD-10-CM | POA: Diagnosis not present

## 2017-08-02 DIAGNOSIS — Z87891 Personal history of nicotine dependence: Secondary | ICD-10-CM | POA: Diagnosis not present

## 2017-08-02 DIAGNOSIS — Z79899 Other long term (current) drug therapy: Secondary | ICD-10-CM | POA: Insufficient documentation

## 2017-08-02 DIAGNOSIS — I259 Chronic ischemic heart disease, unspecified: Secondary | ICD-10-CM | POA: Insufficient documentation

## 2017-08-02 DIAGNOSIS — R1010 Upper abdominal pain, unspecified: Secondary | ICD-10-CM | POA: Diagnosis not present

## 2017-08-02 DIAGNOSIS — R109 Unspecified abdominal pain: Secondary | ICD-10-CM | POA: Diagnosis not present

## 2017-08-02 LAB — LIPASE, BLOOD: Lipase: 27 U/L (ref 11–51)

## 2017-08-02 LAB — URINALYSIS, ROUTINE W REFLEX MICROSCOPIC
Bilirubin Urine: NEGATIVE
GLUCOSE, UA: NEGATIVE mg/dL
Hgb urine dipstick: NEGATIVE
KETONES UR: NEGATIVE mg/dL
Nitrite: NEGATIVE
PH: 6 (ref 5.0–8.0)
Protein, ur: 30 mg/dL — AB
SPECIFIC GRAVITY, URINE: 1.025 (ref 1.005–1.030)

## 2017-08-02 LAB — COMPREHENSIVE METABOLIC PANEL
ALT: 29 U/L (ref 17–63)
ANION GAP: 11 (ref 5–15)
AST: 27 U/L (ref 15–41)
Albumin: 3.9 g/dL (ref 3.5–5.0)
Alkaline Phosphatase: 115 U/L (ref 38–126)
BILIRUBIN TOTAL: 1 mg/dL (ref 0.3–1.2)
BUN: 17 mg/dL (ref 6–20)
CALCIUM: 9.2 mg/dL (ref 8.9–10.3)
CO2: 22 mmol/L (ref 22–32)
Chloride: 108 mmol/L (ref 101–111)
Creatinine, Ser: 0.78 mg/dL (ref 0.61–1.24)
GFR calc Af Amer: >60 mL/min (ref >60)
Glucose, Bld: 180 mg/dL — ABNORMAL HIGH (ref 65–99)
POTASSIUM: 4.3 mmol/L (ref 3.5–5.1)
Sodium: 141 mmol/L (ref 135–145)
TOTAL PROTEIN: 7.3 g/dL (ref 6.5–8.1)

## 2017-08-02 LAB — CBC WITH DIFFERENTIAL/PLATELET
Basophils Absolute: 0 10*3/uL (ref 0.0–0.1)
Basophils Relative: 0 %
Eosinophils Absolute: 0 10*3/uL (ref 0.0–0.7)
Eosinophils Relative: 0 %
HEMATOCRIT: 50.8 % (ref 39.0–52.0)
Hemoglobin: 17.5 g/dL — ABNORMAL HIGH (ref 13.0–17.0)
LYMPHS PCT: 13 %
Lymphs Abs: 1.9 10*3/uL (ref 0.7–4.0)
MCH: 33.1 pg (ref 26.0–34.0)
MCHC: 34.4 g/dL (ref 30.0–36.0)
MCV: 96 fL (ref 78.0–100.0)
MONO ABS: 0.6 10*3/uL (ref 0.1–1.0)
MONOS PCT: 4 %
NEUTROS ABS: 12.2 10*3/uL — AB (ref 1.7–7.7)
Neutrophils Relative %: 83 %
Platelets: 243 10*3/uL (ref 150–400)
RBC: 5.29 MIL/uL (ref 4.22–5.81)
RDW: 14.2 % (ref 11.5–15.5)
WBC: 14.7 10*3/uL — ABNORMAL HIGH (ref 4.0–10.5)

## 2017-08-02 MED ORDER — SODIUM CHLORIDE 0.9 % IV BOLUS (SEPSIS)
500.0000 mL | Freq: Once | INTRAVENOUS | Status: AC
  Administered 2017-08-02: 500 mL via INTRAVENOUS

## 2017-08-02 NOTE — ED Triage Notes (Signed)
Pt states he started having mid abdominal pain and having intermittent lower back pains that he describes as cramps. Pt denies having a fever, chills, or nausea. Pt states he is now sweating and having nausea. Pt states he has intermittent pain highest of a level 10. Last bowel movement was a couple hours ago. Pt states his bowel movement was liquid consistency. Denies passing flatus.

## 2017-08-03 ENCOUNTER — Emergency Department (HOSPITAL_COMMUNITY): Payer: 59

## 2017-08-03 DIAGNOSIS — R109 Unspecified abdominal pain: Secondary | ICD-10-CM | POA: Diagnosis not present

## 2017-08-03 MED ORDER — IOPAMIDOL (ISOVUE-300) INJECTION 61%
100.0000 mL | Freq: Once | INTRAVENOUS | Status: AC | PRN
  Administered 2017-08-03: 100 mL via INTRAVENOUS

## 2017-08-03 MED ORDER — HYDROCODONE-ACETAMINOPHEN 5-325 MG PO TABS: 2.0000 | ORAL_TABLET | ORAL | 0 refills | Status: DC | PRN
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
## 2017-08-03 NOTE — ED Notes (Signed)
Prepack given to pt. 

## 2017-08-03 NOTE — Discharge Instructions (Signed)
Your CT scan today is negative for any source of your symptoms including no obvious infections, blockage or kidney stones.  Please keep a close watch on your symptoms and if you have any development of fever, worse or more frequent episodes of pain or any new symptoms you will need to get rechecked by returning here or calling your primary doctor.  You have been prescribed some medication to help you with pain if the symptoms return, however do not simply mask your pain with these medications, you need to be aware if your symptoms are worsening.  Your blood glucose level is elevated tonight at 180, normal range is less than 110.  You will need to have this rechecked by your primary doctor, please call for follow-up care with him.

## 2017-08-04 LAB — URINE CULTURE

## 2017-08-08 MED FILL — Hydrocodone-Acetaminophen Tab 5-325 MG: ORAL | Qty: 6 | Status: AC

## 2017-08-12 ENCOUNTER — Telehealth: Payer: Self-pay | Admitting: Cardiology

## 2017-08-12 NOTE — Telephone Encounter (Signed)
[name] is calling because when he exerts himself he begins to have chest pains and pain in his left arm (shoulder) .When he is sitting down resting he has absolutely no pain .

## 2017-08-12 NOTE — Telephone Encounter (Signed)
Spoke with patient and he has been having chest pains for the last couple of days that comes and goes. Not currently having pain. Scheduled him an appointment for Wednesday with [facility] Meng PA and advised to call [phone]  if worse or pain not relieved with rest, verbalized understanding.

## 2017-08-14 ENCOUNTER — Ambulatory Visit (INDEPENDENT_AMBULATORY_CARE_PROVIDER_SITE_OTHER): Payer: 59 | Admitting: Physician Assistant

## 2017-08-14 ENCOUNTER — Encounter: Payer: Self-pay | Admitting: Physician Assistant

## 2017-08-14 VITALS — BP 122/86 | HR 67 | Ht 70.0 in | Wt 260.0 lb

## 2017-08-14 DIAGNOSIS — I25110 Atherosclerotic heart disease of native coronary artery with unstable angina pectoris: Secondary | ICD-10-CM

## 2017-08-14 DIAGNOSIS — R079 Chest pain, unspecified: Secondary | ICD-10-CM | POA: Diagnosis not present

## 2017-08-14 DIAGNOSIS — E785 Hyperlipidemia, unspecified: Secondary | ICD-10-CM | POA: Diagnosis not present

## 2017-08-14 DIAGNOSIS — Z79899 Other long term (current) drug therapy: Secondary | ICD-10-CM | POA: Diagnosis not present

## 2017-08-14 DIAGNOSIS — I10 Essential (primary) hypertension: Secondary | ICD-10-CM

## 2017-08-14 DIAGNOSIS — Z01810 Encounter for preprocedural cardiovascular examination: Secondary | ICD-10-CM

## 2017-08-14 DIAGNOSIS — R0602 Shortness of breath: Secondary | ICD-10-CM | POA: Diagnosis not present

## 2017-08-14 MED ORDER — NITROGLYCERIN 0.4 MG SL SUBL: 0.4000 mg | SUBLINGUAL_TABLET | SUBLINGUAL | 3 refills | Status: DC | PRN
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
## 2017-08-14 NOTE — Patient Instructions (Signed)
Medication Instructions:   No changes. Your Nitro refill has been sent to your preferred pharmacy.  Labwork:   We will draw labwork today as part of your pre-cath evaluation.  Testing/Procedures:  See cath instructions below  Follow-Up:  As determined based on results of cardiac catheterization  If you need a refill on your cardiac medications before your next appointment, please call your pharmacy.      [facility] [address] [place] [address] Dept: [phone] Loc: [phone]  [name]  08/14/2017  You are scheduled for a Cardiac Catheterization on Friday, January 4 with Dr. Lance MussJayadeep Varanasi.  1. [facility] at [facility]: [address] [place], [place] [address] at 10:00am (two hours before your procedure to ensure your preparation). Free valet parking service is available.   Special note: Every effort is made to have your procedure done on time. Please understand that emergencies sometimes delay scheduled procedures.  2. Diet: Do not eat or drink anything after midnight prior to your procedure except sips of water to take medications.  3. Labs: Drawn today at office  4. Medication instructions in preparation for your procedure:    On the night before your procedure, take your usual evening medicines.  The morning of the procedure, you may take any morning medications with sips of water.  5. Plan for one night stay--bring personal belongings. 6. Bring a current list of your medications and current insurance cards. 7. You MUST have a responsible person to drive you home. 8. Someone MUST be with you the first 24 hours after you arrive home or your discharge will be delayed. 9. Please wear clothes that are easy to get on and off and wear slip-on shoes.   Thank you for allowing us to care for you!   -- [facility]  Invasive Cardiovascular services

## 2017-08-15 ENCOUNTER — Other Ambulatory Visit: Payer: Self-pay | Admitting: Physician Assistant

## 2017-08-15 LAB — CBC
Hematocrit: 51.3 % — ABNORMAL HIGH (ref 37.5–51.0)
Hemoglobin: 18.1 g/dL — ABNORMAL HIGH (ref 13.0–17.7)
MCH: 33.7 pg — AB (ref 26.6–33.0)
MCHC: 35.3 g/dL (ref 31.5–35.7)
MCV: 96 fL (ref 79–97)
PLATELETS: 266 10*3/uL (ref 150–379)
RBC: 5.37 x10E6/uL (ref 4.14–5.80)
RDW: 14.4 % (ref 12.3–15.4)
WBC: 9.9 10*3/uL (ref 3.4–10.8)

## 2017-08-15 LAB — BASIC METABOLIC PANEL
BUN / CREAT RATIO: 16 (ref 10–24)
BUN: 14 mg/dL (ref 8–27)
CO2: 19 mmol/L — ABNORMAL LOW (ref 20–29)
CREATININE: 0.88 mg/dL (ref 0.76–1.27)
Calcium: 9.4 mg/dL (ref 8.6–10.2)
Chloride: 104 mmol/L (ref 96–106)
GFR calc Af Amer: 108 mL/min/{1.73_m2} (ref >59)
GFR, EST NON AFRICAN AMERICAN: 93 mL/min/{1.73_m2} (ref >59)
GLUCOSE: 113 mg/dL — AB (ref 65–99)
POTASSIUM: 4.6 mmol/L (ref 3.5–5.2)
SODIUM: 142 mmol/L (ref 134–144)

## 2017-08-15 LAB — PROTIME-INR
INR: 1.1 (ref 0.8–1.2)
Prothrombin Time: 11.5 s (ref 9.1–12.0)

## 2017-08-16 ENCOUNTER — Encounter: Payer: Self-pay | Admitting: Physician Assistant

## 2017-08-16 ENCOUNTER — Encounter (HOSPITAL_COMMUNITY): Payer: Self-pay | Admitting: *Deleted

## 2017-08-16 ENCOUNTER — Encounter (HOSPITAL_COMMUNITY)
Admission: AD | Disposition: A | Discharge status: Home / Self Care | Payer: Self-pay | Source: Ambulatory Visit | Attending: Interventional Cardiology

## 2017-08-16 ENCOUNTER — Other Ambulatory Visit: Payer: Self-pay

## 2017-08-16 ENCOUNTER — Inpatient Hospital Stay (HOSPITAL_COMMUNITY)
Admission: AD | Admit: 2017-08-16 | Discharge: 2017-08-20 | DRG: 247 | Disposition: A | Discharge status: Home / Self Care | Payer: 59 | Source: Ambulatory Visit | Attending: Interventional Cardiology | Admitting: Interventional Cardiology

## 2017-08-16 DIAGNOSIS — E669 Obesity, unspecified: Secondary | ICD-10-CM | POA: Diagnosis present

## 2017-08-16 DIAGNOSIS — N189 Chronic kidney disease, unspecified: Secondary | ICD-10-CM | POA: Diagnosis not present

## 2017-08-16 DIAGNOSIS — Z888 Allergy status to other drugs, medicaments and biological substances status: Secondary | ICD-10-CM

## 2017-08-16 DIAGNOSIS — Z7982 Long term (current) use of aspirin: Secondary | ICD-10-CM

## 2017-08-16 DIAGNOSIS — K219 Gastro-esophageal reflux disease without esophagitis: Secondary | ICD-10-CM | POA: Diagnosis present

## 2017-08-16 DIAGNOSIS — R0602 Shortness of breath: Secondary | ICD-10-CM | POA: Diagnosis not present

## 2017-08-16 DIAGNOSIS — I1310 Hypertensive heart and chronic kidney disease without heart failure, with stage 1 through stage 4 chronic kidney disease, or unspecified chronic kidney disease: Secondary | ICD-10-CM | POA: Diagnosis present

## 2017-08-16 DIAGNOSIS — Z833 Family history of diabetes mellitus: Secondary | ICD-10-CM | POA: Diagnosis not present

## 2017-08-16 DIAGNOSIS — I25119 Atherosclerotic heart disease of native coronary artery with unspecified angina pectoris: Secondary | ICD-10-CM | POA: Diagnosis present

## 2017-08-16 DIAGNOSIS — Z9081 Acquired absence of spleen: Secondary | ICD-10-CM

## 2017-08-16 DIAGNOSIS — I2510 Atherosclerotic heart disease of native coronary artery without angina pectoris: Secondary | ICD-10-CM | POA: Diagnosis present

## 2017-08-16 DIAGNOSIS — Z955 Presence of coronary angioplasty implant and graft: Secondary | ICD-10-CM

## 2017-08-16 DIAGNOSIS — E782 Mixed hyperlipidemia: Secondary | ICD-10-CM | POA: Diagnosis present

## 2017-08-16 DIAGNOSIS — I119 Hypertensive heart disease without heart failure: Secondary | ICD-10-CM | POA: Diagnosis present

## 2017-08-16 DIAGNOSIS — D751 Secondary polycythemia: Secondary | ICD-10-CM | POA: Diagnosis present

## 2017-08-16 DIAGNOSIS — E1169 Type 2 diabetes mellitus with other specified complication: Secondary | ICD-10-CM | POA: Diagnosis present

## 2017-08-16 DIAGNOSIS — Z87442 Personal history of urinary calculi: Secondary | ICD-10-CM

## 2017-08-16 DIAGNOSIS — Z8249 Family history of ischemic heart disease and other diseases of the circulatory system: Secondary | ICD-10-CM | POA: Diagnosis not present

## 2017-08-16 DIAGNOSIS — I252 Old myocardial infarction: Secondary | ICD-10-CM | POA: Diagnosis not present

## 2017-08-16 DIAGNOSIS — Z6837 Body mass index (BMI) 37.0-37.9, adult: Secondary | ICD-10-CM

## 2017-08-16 DIAGNOSIS — I25110 Atherosclerotic heart disease of native coronary artery with unstable angina pectoris: Principal | ICD-10-CM | POA: Diagnosis present

## 2017-08-16 DIAGNOSIS — I200 Unstable angina: Secondary | ICD-10-CM

## 2017-08-16 DIAGNOSIS — F17200 Nicotine dependence, unspecified, uncomplicated: Secondary | ICD-10-CM | POA: Diagnosis present

## 2017-08-16 DIAGNOSIS — Z87891 Personal history of nicotine dependence: Secondary | ICD-10-CM

## 2017-08-16 HISTORY — PX: LEFT HEART CATH AND CORONARY ANGIOGRAPHY: CATH118249

## 2017-08-16 SURGERY — LEFT HEART CATH AND CORONARY ANGIOGRAPHY
Anesthesia: LOCAL

## 2017-08-16 MED ORDER — FENTANYL CITRATE (PF) 100 MCG/2ML IJ SOLN
INTRAMUSCULAR | Status: DC | PRN
  Administered 2017-08-16: 25 ug via INTRAVENOUS

## 2017-08-16 MED ORDER — ATORVASTATIN CALCIUM 40 MG PO TABS
40.0000 mg | ORAL_TABLET | Freq: Every day | ORAL | Status: DC
  Administered 2017-08-16 – 2017-08-19 (×4): 40 mg via ORAL
  Filled 2017-08-16 (×4): qty 1

## 2017-08-16 MED ORDER — ASPIRIN 81 MG PO CHEW: 81.0000 mg | CHEWABLE_TABLET | ORAL | Status: DC

## 2017-08-16 MED ORDER — SODIUM CHLORIDE 0.9 % WEIGHT BASED INFUSION: 1.0000 mL/kg/h | INTRAVENOUS | Status: DC

## 2017-08-16 MED ORDER — SODIUM CHLORIDE 0.9% FLUSH: 3.0000 mL | INTRAVENOUS | Status: DC | PRN

## 2017-08-16 MED ORDER — SODIUM CHLORIDE 0.9 % IV SOLN: 250.0000 mL | INTRAVENOUS | Status: DC | PRN

## 2017-08-16 MED ORDER — SODIUM CHLORIDE 0.9% FLUSH
3.0000 mL | Freq: Two times a day (BID) | INTRAVENOUS | Status: DC
  Administered 2017-08-17 – 2017-08-19 (×3): 3 mL via INTRAVENOUS

## 2017-08-16 MED ORDER — SODIUM CHLORIDE 0.9% FLUSH: 3.0000 mL | Freq: Two times a day (BID) | INTRAVENOUS | Status: DC

## 2017-08-16 MED ORDER — LIDOCAINE HCL (PF) 1 % IJ SOLN
INTRAMUSCULAR | Status: DC | PRN
  Administered 2017-08-16: 2 mL

## 2017-08-16 MED ORDER — ACETAMINOPHEN 325 MG PO TABS: 650.0000 mg | ORAL_TABLET | Freq: Four times a day (QID) | ORAL | Status: DC | PRN

## 2017-08-16 MED ORDER — METOPROLOL SUCCINATE ER 50 MG PO TB24
50.0000 mg | ORAL_TABLET | Freq: Every day | ORAL | Status: DC
  Administered 2017-08-16 – 2017-08-20 (×5): 50 mg via ORAL
  Filled 2017-08-16 (×5): qty 1

## 2017-08-16 MED ORDER — HEPARIN (PORCINE) IN NACL 2-0.9 UNIT/ML-% IJ SOLN
INTRAMUSCULAR | Status: DC | PRN
  Administered 2017-08-16: 12:00:00

## 2017-08-16 MED ORDER — VERAPAMIL HCL 2.5 MG/ML IV SOLN
INTRAVENOUS | Status: AC
  Filled 2017-08-16: qty 2

## 2017-08-16 MED ORDER — ONDANSETRON HCL 4 MG/2ML IJ SOLN: 4.0000 mg | Freq: Four times a day (QID) | INTRAMUSCULAR | Status: DC | PRN

## 2017-08-16 MED ORDER — IOPAMIDOL (ISOVUE-370) INJECTION 76%
INTRAVENOUS | Status: AC
  Filled 2017-08-16: qty 100

## 2017-08-16 MED ORDER — SODIUM CHLORIDE 0.9 % IV SOLN
INTRAVENOUS | Status: AC
  Administered 2017-08-16: 19:00:00 via INTRAVENOUS

## 2017-08-16 MED ORDER — IOPAMIDOL (ISOVUE-370) INJECTION 76%
INTRAVENOUS | Status: DC | PRN
  Administered 2017-08-16: 65 mL

## 2017-08-16 MED ORDER — VERAPAMIL HCL 2.5 MG/ML IV SOLN
INTRAVENOUS | Status: DC | PRN
  Administered 2017-08-16: 10 mL via INTRA_ARTERIAL

## 2017-08-16 MED ORDER — ASPIRIN 81 MG PO TABS: 81.0000 mg | ORAL_TABLET | Freq: Every day | ORAL | Status: DC

## 2017-08-16 MED ORDER — SODIUM CHLORIDE 0.9 % WEIGHT BASED INFUSION: 3.0000 mL/kg/h | INTRAVENOUS | Status: DC

## 2017-08-16 MED ORDER — NITROGLYCERIN 0.4 MG SL SUBL
0.4000 mg | SUBLINGUAL_TABLET | SUBLINGUAL | Status: DC | PRN
  Administered 2017-08-18 (×2): 0.4 mg via SUBLINGUAL
  Filled 2017-08-16 (×2): qty 1

## 2017-08-16 MED ORDER — ASPIRIN 81 MG PO CHEW
81.0000 mg | CHEWABLE_TABLET | Freq: Every day | ORAL | Status: DC
  Administered 2017-08-17 – 2017-08-20 (×4): 81 mg via ORAL
  Filled 2017-08-16 (×4): qty 1

## 2017-08-16 MED ORDER — ACETAMINOPHEN 325 MG PO TABS
650.0000 mg | ORAL_TABLET | ORAL | Status: DC | PRN
  Administered 2017-08-18 – 2017-08-19 (×4): 650 mg via ORAL
  Filled 2017-08-16 (×4): qty 2

## 2017-08-16 MED ORDER — FENTANYL CITRATE (PF) 100 MCG/2ML IJ SOLN
INTRAMUSCULAR | Status: AC
  Filled 2017-08-16: qty 2

## 2017-08-16 MED ORDER — LIDOCAINE HCL (PF) 1 % IJ SOLN
INTRAMUSCULAR | Status: AC
  Filled 2017-08-16: qty 30

## 2017-08-16 MED ORDER — LISINOPRIL 5 MG PO TABS
5.0000 mg | ORAL_TABLET | Freq: Every day | ORAL | Status: DC
  Administered 2017-08-16 – 2017-08-20 (×4): 5 mg via ORAL
  Filled 2017-08-16 (×6): qty 1

## 2017-08-16 MED ORDER — HEPARIN (PORCINE) IN NACL 2-0.9 UNIT/ML-% IJ SOLN
INTRAMUSCULAR | Status: AC
  Filled 2017-08-16: qty 1000

## 2017-08-16 MED ORDER — MIDAZOLAM HCL 2 MG/2ML IJ SOLN
INTRAMUSCULAR | Status: AC
  Filled 2017-08-16: qty 2

## 2017-08-16 MED ORDER — HEPARIN SODIUM (PORCINE) 1000 UNIT/ML IJ SOLN
INTRAMUSCULAR | Status: DC | PRN
  Administered 2017-08-16: 6000 [IU] via INTRAVENOUS

## 2017-08-16 MED ORDER — HEPARIN SODIUM (PORCINE) 1000 UNIT/ML IJ SOLN
INTRAMUSCULAR | Status: AC
  Filled 2017-08-16: qty 1

## 2017-08-16 MED ORDER — PANTOPRAZOLE SODIUM 40 MG PO TBEC
40.0000 mg | DELAYED_RELEASE_TABLET | Freq: Every day | ORAL | Status: DC
  Administered 2017-08-16 – 2017-08-20 (×5): 40 mg via ORAL
  Filled 2017-08-16 (×5): qty 1

## 2017-08-16 MED ORDER — MIDAZOLAM HCL 2 MG/2ML IJ SOLN
INTRAMUSCULAR | Status: DC | PRN
  Administered 2017-08-16: 2 mg via INTRAVENOUS

## 2017-08-16 SURGICAL SUPPLY — 9 items
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
## 2017-08-16 NOTE — Interval H&P Note (Signed)
Cath Lab Visit (complete for each Cath Lab visit)  Clinical Evaluation Leading to the Procedure:   ACS: No.  Non-ACS:    Anginal Classification: CCS III  Anti-ischemic medical therapy: Minimal Therapy (1 class of medications)  Non-Invasive Test Results: No non-invasive testing performed  Prior CABG: No previous CABG      History and Physical Interval Note:  08/16/2017 12:18 PM  [name]  has presented today for surgery, with the diagnosis of cp, sob  The various methods of treatment have been discussed with the patient and family. After consideration of risks, benefits and other options for treatment, the patient has consented to  Procedure(s): LEFT HEART CATH AND CORONARY ANGIOGRAPHY (N/A) as a surgical intervention .  The patient's history has been reviewed, patient examined, no change in status, stable for surgery.  I have reviewed the patient's chart and labs.  Questions were answered to the patient's satisfaction.     Lance MussJayadeep Joyce Heitman

## 2017-08-17 NOTE — Progress Notes (Signed)
Subjective:  With no recurrent chest pain overnight  Objective:  Vital Signs in the last 24 hours: BP 117/67   Pulse 69   Temp 97.7 F (36.5 C) (Oral)   Resp (!) 21   Ht 5\' 10"  (1.778 m)   Wt 116.6 kg (257 lb 1.6 oz)   SpO2 96%   BMI 36.89 kg/m   Physical Exam: Pleasant obese male in no acute distress Lungs:  Clear Cardiac:  Regular rhythm, normal S1 and S2, no S3 Extremities:  No edema present, catheterization site clean and dry  Intake/Output from previous day: 01/04 0701 - 01/05 0700 In: 1065 [P.O.:460; I.V.:605] Out: 950 [Urine:950]  Weight Filed Weights   08/16/17 1005 08/16/17 1556 08/17/17 0617  Weight: 117.9 kg (260 lb) 116.2 kg (256 lb 3.2 oz) 116.6 kg (257 lb 1.6 oz)    Lab Results: Basic Metabolic Panel: Recent Labs    08/14/17 1153  NA 142  K 4.6  CL 104  CO2 19*  GLUCOSE 113*  BUN 14  CREATININE 0.88   CBC: Recent Labs    08/14/17 1153  WBC 9.9  HGB 18.1*  HCT 51.3*  MCV 96  PLT 266   Telemetry: Personally reviewed, normal sinus rhythm  Assessment/Plan:  1. Unstable angina pectoris-has been seen by cardiac surgery and further consultation in progress depending on whether he has repeat bypass grafting or multivessel stenting after consultation with interventional cardiologist. 2.  Obesity  Recommendations:  Awaiting consultation to determine future plans.  He is currently off of Effient     W. Ashley RoyaltySpencer Burcher, Jr.  MD [facility]  08/17/2017, 9:15 AM

## 2017-08-18 DIAGNOSIS — I200 Unstable angina: Secondary | ICD-10-CM

## 2017-08-18 LAB — HEPARIN LEVEL (UNFRACTIONATED): HEPARIN UNFRACTIONATED: 0.33 [IU]/mL (ref 0.30–0.70)

## 2017-08-18 MED ORDER — HEPARIN BOLUS VIA INFUSION
4000.0000 [IU] | Freq: Once | INTRAVENOUS | Status: AC
Start: 2017-08-18 — End: 2017-08-18
  Administered 2017-08-18: 4000 [IU] via INTRAVENOUS
  Filled 2017-08-18: qty 4000

## 2017-08-18 MED ORDER — NITROGLYCERIN 2 % TD OINT
0.5000 [in_us] | TOPICAL_OINTMENT | Freq: Four times a day (QID) | TRANSDERMAL | Status: DC
  Administered 2017-08-18 – 2017-08-19 (×6): 0.5 [in_us] via TOPICAL
  Filled 2017-08-18: qty 30

## 2017-08-18 MED ORDER — HEPARIN (PORCINE) IN NACL 100-0.45 UNIT/ML-% IJ SOLN
1500.0000 [IU]/h | INTRAMUSCULAR | Status: DC
  Administered 2017-08-18 – 2017-08-19 (×2): 1300 [IU]/h via INTRAVENOUS
  Filled 2017-08-18 (×3): qty 250

## 2017-08-18 NOTE — Progress Notes (Signed)
Patient called for RN complaining of 4/10 chest pain radiating to left shoulder mainly and a little bit down left arm.  Patient was unable to describe the pain stating it's difficult to describe but did state it feels like the same pain that brought him to the hospital.  Per patient woke up and had to urinate and noticed the chest pain upon awaking.  Patient stated he ambulated to the bathroom and prior he has had this type of pain and with rest it goes away but patient stated this time after he got back in bed from the bathroom the pain did not go away.  EKG completed.  1 sublingual nitroglycerin administered and pain relieved.

## 2017-08-18 NOTE — Progress Notes (Signed)
ANTICOAGULATION CONSULT NOTE - Initial Consult  Pharmacy Consult for Heparin Indication: chest pain/ACS  Allergies  Allergen Reactions  . Niaspan [Niacin Er] Other (See Comments)    Myalgias with elevated CPK while on both niaspan and a statin     Patient Measurements: Height: 5\' 10"  (177.8 cm) Weight: 257 lb 1.6 oz (116.6 kg) IBW/kg (Calculated) : 73 Heparin Dosing Weight: 99 Kg  Vital Signs: Temp: 97.5 F (36.4 C) (01/06 0444) Temp Source: Oral (01/06 0444) BP: 111/71 (01/06 0820) Pulse Rate: 72 (01/06 0643)  Labs: No results for input(s): HGB, HCT, PLT, APTT, LABPROT, INR, HEPARINUNFRC, HEPRLOWMOCWT, CREATININE, CKTOTAL, CKMB, TROPONINI in the last 72 hours.  Estimated Creatinine Clearance: 114.1 mL/min (by C-G formula based on SCr of 0.88 mg/dL).   Medical History: Past Medical History:  Diagnosis Date  . Allergy   . Coronary artery disease    08/2010 NQWM.  Ruptured plaque in the circumflex, bare-metal stenting. Subsequent occlusion of the stent treated with multiple drug-eluting stents into an obtuse marginal.   . GERD (gastroesophageal reflux disease)   . Hyperlipidemia   . Hypertension   . Kidney stones    remotely  . Low serum vitamin D   . Myocardial infarction [facility](HCC) 12/97,1999,2002,2003,2012   Recieved 6 coronary artery stents in 2012  . Oral herpes simplex infection     Medications:  Infusions:  . sodium chloride    . heparin      Assessment: 62 yo male with history of unstable angina and diffuse CAD presents on 1/4 for elective left heart cath. Cardiology consults pending to decide candidacy for repeat bypass grafting versus multivessel stenting.   No PTA anticoagulation. Off Effient since 1/4. CBC wnl on 1/2 and no bleeding reported.   Goal of Therapy:  Heparin level 0.3-0.7 units/ml Monitor platelets by anticoagulation protocol: Yes   Plan:  Heparin 4000 unit IV bolus x 1 Heparin 1300 units/hour Check 6-hour heparin level, then  daily Check daily CBC Monitor for s/sx of bleeding  Adline PotterSabrina Amayah Staheli, PharmD Pharmacy Resident Pager: [phone]

## 2017-08-18 NOTE — Progress Notes (Signed)
Subjective:  Awaiting decision about whether he will have bypass or repeat stenting.  He had recurrent chest discomfort this morning after arising that required nitroglycerin for relief.  Had another episode after eating breakfast this morning.currently pain-free.  EKG does not show any acute abnormality.  Objective:  Vital Signs in the last 24 hours: BP 111/71   Pulse 72   Temp (!) 97.5 F (36.4 C) (Oral)   Resp 18   Ht 5\' 10"  (1.778 m)   Wt 116.6 kg (257 lb 1.6 oz)   SpO2 94%   BMI 36.89 kg/m   Physical Exam: Pleasant obese male in no acute distress Lungs:  Clear Cardiac:  Regular rhythm, normal S1 and S2, no S3 Extremities:  No edema present, catheterization site clean and dry  Intake/Output from previous day: 01/05 0701 - 01/06 0700 In: 1080 [P.O.:1080] Out: 1275 [Urine:1275]  Weight Filed Weights   08/16/17 1005 08/16/17 1556 08/17/17 0617  Weight: 117.9 kg (260 lb) 116.2 kg (256 lb 3.2 oz) 116.6 kg (257 lb 1.6 oz)    Lab Results: Telemetry: Personally reviewed, normal sinus rhythm  Assessment/Plan:  1. Unstable angina pectoris-has been seen by cardiac surgery and further consultation in progress depending on whether he has repeat bypass grafting or multivessel stenting after consultation with interventional cardiologist. He has had recurrent chest discomfort this morning and EKG is unchanged.  He is currently off of Effient in anticipation of what the plans will be. 2.  Obesity  Recommendations:  Awaiting consultation to determine future plans.  He is currently off of Effient.  We'll go ahead and start intravenous heparin and if he has recurrent pain will need IV nitroglycerin.  Awaiting results of what the past 4 will be here whether it will be stenting or bypass grafting.  I will keep nothing by mouth after midnight to increase options for tomorrow.     Darden PalmerW. Spencer Karen, Jr.  MD [facility] Card[facility]  08/18/2017, 9:33 AM

## 2017-08-18 NOTE — Progress Notes (Signed)
Brion AlimentBerge, NP on call with Cardiology paged about information in RN's previous note.

## 2017-08-18 NOTE — Progress Notes (Signed)
ANTICOAGULATION CONSULT NOTE   Pharmacy Consult for Heparin Indication: chest pain/ACS  Allergies  Allergen Reactions  . Niaspan [Niacin Er] Other (See Comments)    Myalgias with elevated CPK while on both niaspan and a statin     Patient Measurements: Height: 5\' 10"  (177.8 cm) Weight: 257 lb 1.6 oz (116.6 kg) IBW/kg (Calculated) : 73 Heparin Dosing Weight: 99 Kg  Vital Signs: Temp: 98 F (36.7 C) (01/06 1344) Temp Source: Oral (01/06 1344) BP: 106/68 (01/06 1344) Pulse Rate: 85 (01/06 1344)  Labs: Recent Labs    08/18/17 1553  HEPARINUNFRC 0.33    Estimated Creatinine Clearance: 114.1 mL/min (by C-G formula based on SCr of 0.88 mg/dL).   Medical History: Past Medical History:  Diagnosis Date  . Allergy   . Coronary artery disease    08/2010 NQWM.  Ruptured plaque in the circumflex, bare-metal stenting. Subsequent occlusion of the stent treated with multiple drug-eluting stents into an obtuse marginal.   . GERD (gastroesophageal reflux disease)   . Hyperlipidemia   . Hypertension   . Kidney stones    remotely  . Low serum vitamin D   . Myocardial infarction [facility](HCC) 12/97,1999,2002,2003,2012   Recieved 6 coronary artery stents in 2012  . Oral herpes simplex infection     Medications:  Infusions:  . sodium chloride    . heparin 1,300 Units/hr (08/18/17 [id number])    Assessment: 62 yo male with history of unstable angina and diffuse CAD presents on 1/4 for elective left heart cath. Cardiology consults pending to decide candidacy for repeat bypass grafting versus multivessel stenting.   Initial heparin level is therapeutic at 0.33.  Goal of Therapy:  Heparin level 0.3-0.7 units/ml Monitor platelets by anticoagulation protocol: Yes   Plan:  -Continue heparin infusion at 1300 units/hr -Confirmatory heparin level with am labs   Pollyann SamplesAndy Balraj Brayfield, PharmD, BCPS 08/18/2017, 4:40 PM

## 2017-08-19 ENCOUNTER — Encounter (HOSPITAL_COMMUNITY)
Admission: AD | Disposition: A | Discharge status: Home / Self Care | Payer: Self-pay | Source: Ambulatory Visit | Attending: Interventional Cardiology

## 2017-08-19 ENCOUNTER — Encounter (HOSPITAL_COMMUNITY): Payer: Self-pay

## 2017-08-19 ENCOUNTER — Encounter (HOSPITAL_COMMUNITY): Payer: Self-pay | Admitting: Interventional Cardiology

## 2017-08-19 DIAGNOSIS — N189 Chronic kidney disease, unspecified: Secondary | ICD-10-CM

## 2017-08-19 DIAGNOSIS — I25110 Atherosclerotic heart disease of native coronary artery with unstable angina pectoris: Principal | ICD-10-CM

## 2017-08-19 DIAGNOSIS — I200 Unstable angina: Secondary | ICD-10-CM

## 2017-08-19 HISTORY — PX: CORONARY ANGIOGRAPHY: CATH118303

## 2017-08-19 HISTORY — PX: CORONARY BALLOON ANGIOPLASTY: CATH118233

## 2017-08-19 HISTORY — PX: CORONARY STENT INTERVENTION: CATH118234

## 2017-08-19 LAB — POCT ACTIVATED CLOTTING TIME
ACTIVATED CLOTTING TIME: 808 s
Activated Clotting Time: 450 seconds
Activated Clotting Time: 323 seconds
Activated Clotting Time: 521 seconds

## 2017-08-19 LAB — CBC
HCT: 45.5 % (ref 39.0–52.0)
Hemoglobin: 15.3 g/dL (ref 13.0–17.0)
MCH: 32.1 pg (ref 26.0–34.0)
MCHC: 33.6 g/dL (ref 30.0–36.0)
MCV: 95.6 fL (ref 78.0–100.0)
PLATELETS: 239 10*3/uL (ref 150–400)
RBC: 4.76 MIL/uL (ref 4.22–5.81)
RDW: 14.3 % (ref 11.5–15.5)
WBC: 13.1 10*3/uL — AB (ref 4.0–10.5)

## 2017-08-19 LAB — HEPARIN LEVEL (UNFRACTIONATED): Heparin Unfractionated: 0.24 IU/mL — ABNORMAL LOW (ref 0.30–0.70)

## 2017-08-19 LAB — PLATELET INHIBITION P2Y12: Platelet Function  P2Y12: 114 [PRU] — ABNORMAL LOW (ref 194–418)

## 2017-08-19 SURGERY — CORONARY STENT INTERVENTION
Anesthesia: LOCAL

## 2017-08-19 MED ORDER — VERAPAMIL HCL 2.5 MG/ML IV SOLN
INTRAVENOUS | Status: DC | PRN
  Administered 2017-08-19: 10 mL via INTRA_ARTERIAL

## 2017-08-19 MED ORDER — LIDOCAINE HCL (PF) 1 % IJ SOLN
INTRAMUSCULAR | Status: AC
  Filled 2017-08-19: qty 30

## 2017-08-19 MED ORDER — SODIUM CHLORIDE 0.9% FLUSH: 3.0000 mL | Freq: Two times a day (BID) | INTRAVENOUS | Status: DC

## 2017-08-19 MED ORDER — PRASUGREL HCL 10 MG PO TABS
10.0000 mg | ORAL_TABLET | Freq: Every day | ORAL | Status: DC
  Administered 2017-08-19 – 2017-08-20 (×2): 10 mg via ORAL
  Filled 2017-08-19 (×2): qty 1

## 2017-08-19 MED ORDER — FENTANYL CITRATE (PF) 100 MCG/2ML IJ SOLN
INTRAMUSCULAR | Status: AC
  Filled 2017-08-19: qty 2

## 2017-08-19 MED ORDER — HEPARIN SODIUM (PORCINE) 1000 UNIT/ML IJ SOLN
INTRAMUSCULAR | Status: DC | PRN
  Administered 2017-08-19: 12000 [IU] via INTRAVENOUS

## 2017-08-19 MED ORDER — SODIUM CHLORIDE 0.9 % IV SOLN: 250.0000 mL | INTRAVENOUS | Status: DC | PRN

## 2017-08-19 MED ORDER — MIDAZOLAM HCL 2 MG/2ML IJ SOLN
INTRAMUSCULAR | Status: DC | PRN
  Administered 2017-08-19: 2 mg via INTRAVENOUS

## 2017-08-19 MED ORDER — MIDAZOLAM HCL 2 MG/2ML IJ SOLN
INTRAMUSCULAR | Status: AC
  Filled 2017-08-19: qty 2

## 2017-08-19 MED ORDER — HEPARIN SODIUM (PORCINE) 1000 UNIT/ML IJ SOLN
INTRAMUSCULAR | Status: AC
  Filled 2017-08-19: qty 1

## 2017-08-19 MED ORDER — VERAPAMIL HCL 2.5 MG/ML IV SOLN
INTRAVENOUS | Status: AC
  Filled 2017-08-19: qty 2

## 2017-08-19 MED ORDER — ANGIOPLASTY BOOK
Freq: Once | Status: AC
  Administered 2017-08-19: 23:00:00
  Filled 2017-08-19: qty 1

## 2017-08-19 MED ORDER — SODIUM CHLORIDE 0.9% FLUSH: 3.0000 mL | INTRAVENOUS | Status: DC | PRN

## 2017-08-19 MED ORDER — IOPAMIDOL (ISOVUE-370) INJECTION 76%
INTRAVENOUS | Status: AC
  Filled 2017-08-19: qty 125

## 2017-08-19 MED ORDER — SODIUM CHLORIDE 0.9 % WEIGHT BASED INFUSION
3.0000 mL/kg/h | INTRAVENOUS | Status: DC
  Administered 2017-08-19: 3 mL/kg/h via INTRAVENOUS

## 2017-08-19 MED ORDER — FENTANYL CITRATE (PF) 100 MCG/2ML IJ SOLN
INTRAMUSCULAR | Status: DC | PRN
  Administered 2017-08-19 (×2): 50 ug via INTRAVENOUS

## 2017-08-19 MED ORDER — IOPAMIDOL (ISOVUE-370) INJECTION 76%
INTRAVENOUS | Status: AC
  Filled 2017-08-19: qty 50

## 2017-08-19 MED ORDER — LIDOCAINE HCL (PF) 1 % IJ SOLN
INTRAMUSCULAR | Status: DC | PRN
  Administered 2017-08-19: 2 mL

## 2017-08-19 MED ORDER — IOPAMIDOL (ISOVUE-370) INJECTION 76%
INTRAVENOUS | Status: AC
  Filled 2017-08-19: qty 100

## 2017-08-19 MED ORDER — SODIUM CHLORIDE 0.9 % WEIGHT BASED INFUSION
1.0000 mL/kg/h | INTRAVENOUS | Status: DC
  Administered 2017-08-19: 1 mL/kg/h via INTRAVENOUS

## 2017-08-19 MED ORDER — LABETALOL HCL 5 MG/ML IV SOLN: 10.0000 mg | INTRAVENOUS | Status: AC | PRN

## 2017-08-19 MED ORDER — HYDRALAZINE HCL 20 MG/ML IJ SOLN: 5.0000 mg | INTRAMUSCULAR | Status: AC | PRN

## 2017-08-19 MED ORDER — IOPAMIDOL (ISOVUE-370) INJECTION 76%
INTRAVENOUS | Status: DC | PRN
  Administered 2017-08-19: 270 mL

## 2017-08-19 MED ORDER — NITROGLYCERIN 1 MG/10 ML FOR IR/CATH LAB
INTRA_ARTERIAL | Status: DC | PRN
  Administered 2017-08-19 (×3): 200 ug via INTRACORONARY

## 2017-08-19 MED ORDER — HEPARIN (PORCINE) IN NACL 2-0.9 UNIT/ML-% IJ SOLN
INTRAMUSCULAR | Status: AC
  Filled 2017-08-19: qty 1000

## 2017-08-19 MED ORDER — SODIUM CHLORIDE 0.9 % IV SOLN: INTRAVENOUS | Status: AC

## 2017-08-19 SURGICAL SUPPLY — 23 items
BALLN SAPPHIRE 2.0X10 (BALLOONS) ×2
BALLN SAPPHIRE 2.0X15 (BALLOONS) ×2
BALLN SAPPHIRE 2.25X20 (BALLOONS) ×2
BALLN SAPPHIRE ~~LOC~~ 2.5X15 (BALLOONS) ×1 IMPLANT
BALLN ~~LOC~~ EMERGE MR 2.5X20 (BALLOONS) ×2
BALLOON SAPPHIRE 2.0X10 (BALLOONS) IMPLANT
BALLOON SAPPHIRE 2.0X15 (BALLOONS) IMPLANT
BALLOON SAPPHIRE 2.25X20 (BALLOONS) IMPLANT
BALLOON ~~LOC~~ EMERGE MR 2.5X20 (BALLOONS) IMPLANT
CATH LAUNCHER 6FR AL.75 (CATHETERS) ×1 IMPLANT
CATH VISTA GUIDE 6FR XBLAD3.5 (CATHETERS) ×1 IMPLANT
DEVICE RAD COMP TR BAND LRG (VASCULAR PRODUCTS) ×1 IMPLANT
GLIDESHEATH SLEND SS 6F .021 (SHEATH) ×1 IMPLANT
GUIDEWIRE INQWIRE 1.5J.035X260 (WIRE) IMPLANT
INQWIRE 1.5J .035X260CM (WIRE) ×2
KIT ENCORE 26 ADVANTAGE (KITS) ×2 IMPLANT
KIT HEART LEFT (KITS) ×2 IMPLANT
PACK CARDIAC CATHETERIZATION (CUSTOM PROCEDURE TRAY) ×2 IMPLANT
STENT SYNERGY DES 2.25X28 (Permanent Stent) ×1 IMPLANT
STENT SYNERGY DES 2.5X32 (Permanent Stent) ×1 IMPLANT
TRANSDUCER W/STOPCOCK (MISCELLANEOUS) ×2 IMPLANT
TUBING CIL FLEX 10 FLL-RA (TUBING) ×2 IMPLANT
WIRE COUGAR XT STRL 190CM (WIRE) ×2 IMPLANT

## 2017-08-19 NOTE — Progress Notes (Signed)
While making am rounds/med pass pt informed me that at approx 0200 he had an episode of " twinges of pain in my chest". Pt stated he did not call because it went away. Educated pt to call RN for ANY complaints of chest pain/discomfort pt voices understanding. Pt chest pain free/no complaints at this time, VS stable, pt remains NPO for possible intervention. Will continue to monitor. Dierdre HighmanHall, Amere Bricco Marie, RN

## 2017-08-19 NOTE — Progress Notes (Signed)
 Progress Note  Patient Name: [name] Date of Encounter: 08/19/2017  Primary Cardiologist:   Cerissa Zeiger, MD   Subjective   Chest pain last night.  None currently.    Inpatient Medications    Scheduled Meds: . aspirin  81 mg Oral Daily  . atorvastatin  40 mg Oral q1800  . lisinopril  5 mg Oral Daily  . metoprolol succinate  50 mg Oral Daily  . nitroGLYCERIN  0.5 inch Topical Q6H  . pantoprazole  40 mg Oral Daily  . sodium chloride flush  3 mL Intravenous Q12H   Continuous Infusions: . sodium chloride    . heparin 1,500 Units/hr (08/19/17 0733)   PRN Meds: sodium chloride, acetaminophen, nitroGLYCERIN, ondansetron (ZOFRAN) IV, sodium chloride flush   Vital Signs    Vitals:   08/18/17 0820 08/18/17 1344 08/18/17 2127 08/19/17 0634  BP: 111/71 106/68 105/67 127/76  Pulse:  85 68 71  Resp:   18 18  Temp:  98 F (36.7 C) 97.7 F (36.5 C) 97.8 F (36.6 C)  TempSrc:  Oral Oral Oral  SpO2:  96% 98% 96%  Weight:    257 lb 11.2 oz (116.9 kg)  Height:        Intake/Output Summary (Last 24 hours) at 08/19/2017 0848 Last data filed at 08/19/2017 0827 Gross per 24 hour  Intake 1363.41 ml  Output 975 ml  Net 388.41 ml   Filed Weights   08/16/17 1556 08/17/17 0617 08/19/17 0634  Weight: 256 lb 3.2 oz (116.2 kg) 257 lb 1.6 oz (116.6 kg) 257 lb 11.2 oz (116.9 kg)    Telemetry    NSR - Personally Reviewed  ECG    NA - Personally Reviewed  Physical Exam   GEN: No acute distress.   Neck: No  JVD Cardiac: RRR, no murmurs, rubs, or gallops.  Respiratory: Clear  to auscultation bilaterally. GI: Soft, nontender, non-distended  MS: No  edema; No deformity. Neuro:  Nonfocal  Psych: Normal affect   Labs    Chemistry Recent Labs  Lab 08/14/17 1153  NA 142  K 4.6  CL 104  CO2 19*  GLUCOSE 113*  BUN 14  CREATININE 0.88  CALCIUM 9.4  GFRNONAA 93  GFRAA 108     Hematology Recent Labs  Lab 08/14/17 1153 08/19/17 0415  WBC 9.9 13.1*  RBC 5.37  4.76  HGB 18.1* 15.3  HCT 51.3* 45.5  MCV 96 95.6  MCH 33.7* 32.1  MCHC 35.3 33.6  RDW 14.4 14.3  PLT 266 239    Cardiac EnzymesNo results for input(s): TROPONINI in the last 168 hours. No results for input(s): TROPIPOC in the last 168 hours.   BNPNo results for input(s): BNP, PROBNP in the last 168 hours.   DDimer No results for input(s): DDIMER in the last 168 hours.   Radiology    No results found.  Cardiac Studies   CARDIAC CATH:    The left ventricular systolic function is normal.  LV end diastolic pressure is normal.  The left ventricular ejection fraction is 50-55% by visual estimate.  There is no aortic valve stenosis.  Mid RCA lesion is 60% stenosed.  Ost RPDA to RPDA lesion is 100% stenosed.  Post Atrio lesion is 80% stenosed.  Prox RCA lesion is 40% stenosed.  Mid LAD lesion is 75% stenosed.  Ost 2nd Diag to 2nd Diag lesion is 75% stenosed.  1st Diag lesion is 80% stenosed.  Ost LAD to Prox LAD lesion is   25% stenosed.  Ost 1st Mrg to 1st Mrg lesion is 10% stenosed.   Severe, diffuse CAD involving the LAD and RCA.  Multiple lesions including CTO of PDA.     Patient Profile     61 y.o. male with unstable angina.   Cardiac cath this admission with patent circ stent.  However, diffuse disease in the LAD.  High grade stenosis in the RCA.    Assessment & Plan    UNSTABLE ANGINA:    I have reviewed the cath films.  I discussed these with Dr. Cooper and phoned Dr. Gerhardt.  The plan, after discussion with the patient, is to PCI the distal RCA and take further images of the LAD.  We would prefer to manage this medically if possible vs.multiple stents vs LIMA to LAD in the future.  Restart Effient today.  Discussed with pharmacy and we did not think that he needed to be reloaded but rather restarted on maintenance dose.  The patient understands that risks included but are not limited to stroke (1 in 1000), death (1 in 1000), kidney failure [usually  temporary] (1 in 500), bleeding (1 in 200), allergic reaction [possibly serious] (1 in 200).  The patient understands and agrees to proceed.    DYSLIPIDEMIA.  :  Last LDL was 78.    CKD:    This has been mild in the past but creat is OK recently.  I will repeat a BMET this morning however.    HTN:  BP OK.    For questions or updates, please contact [facility] Please consult [email or web address] for contact info under Cardiology/STEMI.   Signed, Gunter Conde, MD  08/19/2017, 8:48 AM    

## 2017-08-19 NOTE — H&P (View-Only) (Signed)
Progress Note  Patient Name: [name] Date of Encounter: 08/19/2017  Primary Cardiologist:   Rollene RotundaJames Karandeep Resende, MD   Subjective   Chest pain last night.  None currently.    Inpatient Medications    Scheduled Meds: . aspirin  81 mg Oral Daily  . atorvastatin  40 mg Oral q1800  . lisinopril  5 mg Oral Daily  . metoprolol succinate  50 mg Oral Daily  . nitroGLYCERIN  0.5 inch Topical Q6H  . pantoprazole  40 mg Oral Daily  . sodium chloride flush  3 mL Intravenous Q12H   Continuous Infusions: . sodium chloride    . heparin 1,500 Units/hr (08/19/17 0733)   PRN Meds: sodium chloride, acetaminophen, nitroGLYCERIN, ondansetron (ZOFRAN) IV, sodium chloride flush   Vital Signs    Vitals:   08/18/17 0820 08/18/17 1344 08/18/17 2127 08/19/17 0634  BP: 111/71 106/68 105/67 127/76  Pulse:  85 68 71  Resp:   18 18  Temp:  98 F (36.7 C) 97.7 F (36.5 C) 97.8 F (36.6 C)  TempSrc:  Oral Oral Oral  SpO2:  96% 98% 96%  Weight:    257 lb 11.2 oz (116.9 kg)  Height:        Intake/Output Summary (Last 24 hours) at 08/19/2017 0848 Last data filed at 08/19/2017 0827 Gross per 24 hour  Intake 1363.41 ml  Output 975 ml  Net 388.41 ml   Filed Weights   08/16/17 1556 08/17/17 0617 08/19/17 0634  Weight: 256 lb 3.2 oz (116.2 kg) 257 lb 1.6 oz (116.6 kg) 257 lb 11.2 oz (116.9 kg)    Telemetry    NSR - Personally Reviewed  ECG    NA - Personally Reviewed  Physical Exam   GEN: No acute distress.   Neck: No  JVD Cardiac: RRR, no murmurs, rubs, or gallops.  Respiratory: Clear  to auscultation bilaterally. GI: Soft, nontender, non-distended  MS: No  edema; No deformity. Neuro:  Nonfocal  Psych: Normal affect   Labs    Chemistry Recent Labs  Lab 08/14/17 1153  NA 142  K 4.6  CL 104  CO2 19*  GLUCOSE 113*  BUN 14  CREATININE 0.88  CALCIUM 9.4  GFRNONAA 93  GFRAA 108     Hematology Recent Labs  Lab 08/14/17 1153 08/19/17 0415  WBC 9.9 13.1*  RBC 5.37  4.76  HGB 18.1* 15.3  HCT 51.3* 45.5  MCV 96 95.6  MCH 33.7* 32.1  MCHC 35.3 33.6  RDW 14.4 14.3  PLT 266 239    Cardiac EnzymesNo results for input(s): TROPONINI in the last 168 hours. No results for input(s): TROPIPOC in the last 168 hours.   BNPNo results for input(s): BNP, PROBNP in the last 168 hours.   DDimer No results for input(s): DDIMER in the last 168 hours.   Radiology    No results found.  Cardiac Studies   CARDIAC CATH:    The left ventricular systolic function is normal.  LV end diastolic pressure is normal.  The left ventricular ejection fraction is 50-55% by visual estimate.  There is no aortic valve stenosis.  Mid RCA lesion is 60% stenosed.  Ost RPDA to RPDA lesion is 100% stenosed.  Post Atrio lesion is 80% stenosed.  Prox RCA lesion is 40% stenosed.  Mid LAD lesion is 75% stenosed.  Ost 2nd Diag to 2nd Diag lesion is 75% stenosed.  1st Diag lesion is 80% stenosed.  Ost LAD to Prox LAD lesion is  25% stenosed.  Ost 1st Mrg to 1st Mrg lesion is 10% stenosed.   Severe, diffuse CAD involving the LAD and RCA.  Multiple lesions including CTO of PDA.     Patient Profile     62 y.o. male with unstable angina.   Cardiac cath this admission with patent circ stent.  However, diffuse disease in the LAD.  High grade stenosis in the RCA.    Assessment & Plan    UNSTABLE ANGINA:    I have reviewed the cath films.  I discussed these with Dr. Excell Seltzer and phoned Dr. Tyrone Sage.  The plan, after discussion with the patient, is to PCI the distal RCA and take further images of the LAD.  We would prefer to manage this medically if possible vs.multiple stents vs LIMA to LAD in the future.  Restart Effient today.  Discussed with pharmacy and we did not think that he needed to be reloaded but rather restarted on maintenance dose.  The patient understands that risks included but are not limited to stroke (1 in 1000), death (1 in 1000), kidney failure [usually  temporary] (1 in 500), bleeding (1 in 200), allergic reaction [possibly serious] (1 in 200).  The patient understands and agrees to proceed.    DYSLIPIDEMIA.  :  Last LDL was 78.    CKD:    This has been mild in the past but creat is OK recently.  I will repeat a BMET this morning however.    HTN:  BP OK.    For questions or updates, please contact [facility] Please consult [email or web address] for contact info under Cardiology/STEMI.   Signed, Rollene Rotunda, MD  08/19/2017, 8:48 AM

## 2017-08-19 NOTE — Progress Notes (Signed)
TR BAND REMOVAL  LOCATION:    right radial  DEFLATED PER PROTOCOL:    Yes.    TIME BAND OFF / DRESSING APPLIED:    21:15   SITE UPON ARRIVAL:    Level 0  SITE AFTER BAND REMOVAL:    Level 0  CIRCULATION SENSATION AND MOVEMENT:    Within Normal Limits   Yes.    COMMENTS:   Post TR band instructions given. Pt tolerated well. 

## 2017-08-19 NOTE — Progress Notes (Signed)
ANTICOAGULATION CONSULT NOTE   Pharmacy Consult for Heparin Indication: chest pain/ACS  Allergies  Allergen Reactions  . Niaspan [Niacin Er] Other (See Comments)    Myalgias with elevated CPK while on both niaspan and a statin     Patient Measurements: Height: 5\' 10"  (177.8 cm) Weight: 257 lb 1.6 oz (116.6 kg) IBW/kg (Calculated) : 73 Heparin Dosing Weight: 99 Kg  Vital Signs: Temp: 97.7 F (36.5 C) (01/06 2127) Temp Source: Oral (01/06 2127) BP: 105/67 (01/06 2127) Pulse Rate: 68 (01/06 2127)  Labs: Recent Labs    08/18/17 1553 08/19/17 0415  HGB  --  15.3  HCT  --  45.5  PLT  --  239  HEPARINUNFRC 0.33 0.24*    Estimated Creatinine Clearance: 114.1 mL/min (by C-G formula based on SCr of 0.88 mg/dL).  Assessment: 62 y.o. male with CAD awaiting PCI/CABG for heparin  Goal of Therapy:  Heparin level 0.3-0.7 units/ml Monitor platelets by anticoagulation protocol: Yes   Plan:  Increase Heparin 1500 units/hr  Geannie RisenGreg Ian Castagna, PharmD, BCPS

## 2017-08-19 NOTE — Interval H&P Note (Signed)
History and Physical Interval Note:  08/19/2017 3:22 PM  [name] R Vera  has presented today for cardiac cath with the diagnosis of CAD with unstable angina. The various methods of treatment have been discussed with the patient and family. After consideration of risks, benefits and other options for treatment, the patient has consented to  Procedure(s): CORONARY STENT INTERVENTION (N/A) as a surgical intervention .  The patient's history has been reviewed, patient examined, no change in status, stable for surgery.  I have reviewed the patient's chart and labs.  Questions were answered to the patient's satisfaction.    Cath Lab Visit (complete for each Cath Lab visit)  Clinical Evaluation Leading to the Procedure:   ACS: No.  Non-ACS:    Anginal Classification: CCS III  Anti-ischemic medical therapy: Minimal Therapy (1 class of medications)  Non-Invasive Test Results: No non-invasive testing performed  Prior CABG: No previous CABG         Verne Carrowhristopher Kaiea Esselman

## 2017-08-20 ENCOUNTER — Encounter (HOSPITAL_COMMUNITY): Payer: Self-pay | Admitting: Cardiovascular Disease

## 2017-08-20 LAB — CBC
HCT: 46 % (ref 39.0–52.0)
Hemoglobin: 15.2 g/dL (ref 13.0–17.0)
MCH: 31.7 pg (ref 26.0–34.0)
MCHC: 33 g/dL (ref 30.0–36.0)
MCV: 95.8 fL (ref 78.0–100.0)
PLATELETS: 229 10*3/uL (ref 150–400)
RBC: 4.8 MIL/uL (ref 4.22–5.81)
RDW: 14.4 % (ref 11.5–15.5)
WBC: 10.8 10*3/uL — ABNORMAL HIGH (ref 4.0–10.5)

## 2017-08-20 LAB — BASIC METABOLIC PANEL
Anion gap: 7 (ref 5–15)
BUN: 18 mg/dL (ref 6–20)
CO2: 25 mmol/L (ref 22–32)
CREATININE: 0.81 mg/dL (ref 0.61–1.24)
Calcium: 8.6 mg/dL — ABNORMAL LOW (ref 8.9–10.3)
Chloride: 107 mmol/L (ref 101–111)
GFR calc Af Amer: >60 mL/min (ref >60)
GLUCOSE: 105 mg/dL — AB (ref 65–99)
POTASSIUM: 4.1 mmol/L (ref 3.5–5.1)
Sodium: 139 mmol/L (ref 135–145)

## 2017-08-20 MED ORDER — ATORVASTATIN CALCIUM 80 MG PO TABS: 80.0000 mg | ORAL_TABLET | Freq: Every day | ORAL | 6 refills | Status: DC

## 2017-08-20 MED ORDER — LISINOPRIL 5 MG PO TABS: 5.0000 mg | ORAL_TABLET | Freq: Every day | ORAL | 3 refills | Status: DC

## 2017-08-20 NOTE — Progress Notes (Signed)
Progress Note  Patient Name: [name] R Flaugher Date of Encounter: 08/20/2017  Primary Cardiologist: Rollene RotundaJames Carisma Troupe, MD   Subjective   Feeling well. No chest pain, sob or palpitations.   Inpatient Medications    Scheduled Meds: . aspirin  81 mg Oral Daily  . atorvastatin  40 mg Oral q1800  . lisinopril  5 mg Oral Daily  . metoprolol succinate  50 mg Oral Daily  . pantoprazole  40 mg Oral Daily  . prasugrel  10 mg Oral Daily  . sodium chloride flush  3 mL Intravenous Q12H   Continuous Infusions: . sodium chloride     PRN Meds: sodium chloride, acetaminophen, nitroGLYCERIN, ondansetron (ZOFRAN) IV, sodium chloride flush   Vital Signs    Vitals:   08/19/17 1900 08/19/17 2000 08/19/17 2100 08/20/17 0408  BP: 140/83 127/66 137/79 137/72  Pulse: 67 70 69 62  Resp: 17 (!) 21 (!) 22 (!) 21  Temp:  97.7 F (36.5 C)  97.6 F (36.4 C)  TempSrc:  Oral  Oral  SpO2: 96% 96% 95% 94%  Weight:    263 lb 0.1 oz (119.3 kg)  Height:        Intake/Output Summary (Last 24 hours) at 08/20/2017 0857 Last data filed at 08/20/2017 0257 Gross per 24 hour  Intake 1298.1 ml  Output 2150 ml  Net -851.9 ml   Filed Weights   08/17/17 0617 08/19/17 0634 08/20/17 0408  Weight: 257 lb 1.6 oz (116.6 kg) 257 lb 11.2 oz (116.9 kg) 263 lb 0.1 oz (119.3 kg)    Telemetry    SR with rare PVCs- Personally Reviewed  ECG    None today   Physical Exam   GEN: No acute distress.   Neck: No JVD Cardiac: RRR, no murmurs, rubs, or gallops. R radial cath site without hematoma.  Respiratory: Clear to auscultation bilaterally. GI: Soft, nontender, non-distended  MS: No edema; No deformity. Neuro:  Nonfocal  Psych: Normal affect   Labs    Chemistry Recent Labs  Lab 08/14/17 1153 08/20/17 0358  NA 142 139  K 4.6 4.1  CL 104 107  CO2 19* 25  GLUCOSE 113* 105*  BUN 14 18  CREATININE 0.88 0.81  CALCIUM 9.4 8.6*  GFRNONAA 93 >60  GFRAA 108 >60  ANIONGAP  --  7     Hematology Recent Labs   Lab 08/14/17 1153 08/19/17 0415 08/20/17 0358  WBC 9.9 13.1* 10.8*  RBC 5.37 4.76 4.80  HGB 18.1* 15.3 15.2  HCT 51.3* 45.5 46.0  MCV 96 95.6 95.8  MCH 33.7* 32.1 31.7  MCHC 35.3 33.6 33.0  RDW 14.4 14.3 14.4  PLT 266 239 229    Radiology    No results found.  Cardiac Studies   LEFT HEART CATH AND CORONARY ANGIOGRAPHY  08/16/17  Conclusion     The left ventricular systolic function is normal.  LV end diastolic pressure is normal.  The left ventricular ejection fraction is 50-55% by visual estimate.  There is no aortic valve stenosis.  Mid RCA lesion is 60% stenosed.  Ost RPDA to RPDA lesion is 100% stenosed.  Post Atrio lesion is 80% stenosed.  Prox RCA lesion is 40% stenosed.  Mid LAD lesion is 75% stenosed.  Ost 2nd Diag to 2nd Diag lesion is 75% stenosed.  1st Diag lesion is 80% stenosed.  Ost LAD to Prox LAD lesion is 25% stenosed.  Ost 1st Mrg to 1st Mrg lesion is 10% stenosed.   Severe,  diffuse CAD involving the LAD and RCA.  Multiple lesions including CTO of PDA.      CORONARY ANGIOGRAPHY  08/19/17  CORONARY BALLOON ANGIOPLASTY  CORONARY STENT INTERVENTION  Conclusion   1. Severe stenosis mid LAD at the bifurcation of the 2.0 mm Diagonal branch. Successful PTCA of the Diagonal branch ostium. Successful PTCA/DES x 1 mid LAD 2. Severe stenosis in the distal RCA/posterolateral artery. Successful PTCA/DES x 1 distal RCA/posterolateral artery.   Recommendations: Continue DAPT with ASA and Effient for at least one year but longer if he tolerates.      Patient Profile     62 y.o. male male with PMH of HTN, HLD, and CAD presented for outpatient cath for unstable angina. angina.   Cardiac cath this admission with patent circ stent.  However, diffuse disease in the LAD.  High grade stenosis in the RCA. Seen by CTCA for possible coronary artery bypass to the LAD versus stenting of the LAD with possible attempt to open the PD with which is chronically  occluded.    Assessment & Plan    1. Unstable angina - S/p  Successful PTCA of the Diagonal branch ostium, PTCA/DES x 1 mid LAD and PTCA/DES x 1 distal RCA/posterolateral artery. Ambulated well with cardiac rehab. CRP 2 at [place].  - Continue ASA, Effient, statin and BB.   2. HLD - 05/06/2017: Cholesterol, Total 148; HDL 44; LDL Calculated 78; Triglycerides 129 - Increase lipitor to 80mg  qd. LDL goal less than 70s.   3. HTN - BP stable on current medications    For questions or updates, please contact [facility] Please consult [email or web address] for contact info under Cardiology/STEMI.      SignedManson Passey, PA  08/20/2017, 8:57 AM    History and all data above reviewed.  Patient examined.  I agree with the findings as above.  No further chest pain.  No SOB.   The patient exam reveals COR:RRR  ,  Lungs: Clear  ,  Abd: Positive bowel sounds, no rebound no guarding, Ext Right wrist without bleeding or brusing  .  All available labs, radiology testing, previous records reviewed. Agree with documented assessment and plan. OK to discharge on meds as listed.  Has follow up in [place].    Fayrene Fearing Arpita Fentress  9:40 AM  08/20/2017

## 2017-08-20 NOTE — Progress Notes (Signed)
CARDIAC REHAB PHASE I   PRE:  Rate/Rhythm: 75 SR  BP:  Supine:   Sitting: 125/68  Standing:    SaO2:   MODE:  Ambulation: 550 ft   POST:  Rate/Rhythm: 90 SR  BP:  Supine:   Sitting: 142/82  Standing:    SaO2: 95%RA 0755-0850 Pt walked 550 ft on RA with steady gait. Tolerated well. No CP. Education completed with pt and wife who voiced understanding. Reviewed importance of effient with stent. Discussed NTG use, risk factors, ex ed and CRP 2. Gave heart healthy diet and discussed healthy food choices. Discussed CRP 2 and will refer to [place].   Matthew Nuttingharlene Vitali Seibert, RN BSN  08/20/2017 8:44 AM

## 2017-08-20 NOTE — Discharge Summary (Signed)
Discharge Summary    Patient ID: [name],  MRN: [id number], DOB/AGE: 1955-09-16 62 y.o.  Admit date: 08/16/2017 Discharge date: 08/20/2017  Primary Care Provider: Ernestina PennaMoore, Donald W Primary Cardiologist: Dr. Antoine PocheHochrein   Discharge Diagnoses    Principal Problem:   Unstable angina [facility](HCC) Active Problems:   Obesity (BMI 30-39.9)   TOBACCO ABUSE   Hypertensive heart disease   CAD (coronary artery disease), native coronary artery   Hyperlipidemia, mixed   Allergies Allergies  Allergen Reactions  . Niaspan [Niacin Er] Other (See Comments)    Myalgias with elevated CPK while on both niaspan and a statin     Diagnostic Studies/Procedures    LEFT HEART CATH AND CORONARY ANGIOGRAPHY  08/16/17  Conclusion     The left ventricular systolic function is normal.  LV end diastolic pressure is normal.  The left ventricular ejection fraction is 50-55% by visual estimate.  There is no aortic valve stenosis.  Mid RCA lesion is 60% stenosed.  Ost RPDA to RPDA lesion is 100% stenosed.  Post Atrio lesion is 80% stenosed.  Prox RCA lesion is 40% stenosed.  Mid LAD lesion is 75% stenosed.  Ost 2nd Diag to 2nd Diag lesion is 75% stenosed.  1st Diag lesion is 80% stenosed.  Ost LAD to Prox LAD lesion is 25% stenosed.  Ost 1st Mrg to 1st Mrg lesion is 10% stenosed.  Severe, diffuse CAD involving the LAD and RCA. Multiple lesions including CTO of PDA.     CORONARY ANGIOGRAPHY  08/19/17  CORONARY BALLOON ANGIOPLASTY  CORONARY STENT INTERVENTION  Conclusion   1. Severe stenosis mid LAD at the bifurcation of the 2.0 mm Diagonal branch. Successful PTCA of the Diagonal branch ostium. Successful PTCA/DES x 1 mid LAD 2. Severe stenosis in the distal RCA/posterolateral artery. Successful PTCA/DES x 1 distal RCA/posterolateral artery.   Recommendations: Continue DAPT with ASA and Effient for at least one year but longer if he tolerates.      History of Present Illness      62 y.o. male malewith PMH of HTN, HLD, and CAD presented 08/16/17 for outpatient cath for unstable angina.    He underwent successful BMS to left circumflex on 09/04/2010, he had a diffuse nonobstructive LAD and RCA disease with distal posterior descending artery occlusion and a left to right collaterals.  Hospitalization was complicated by acute stent thrombosis on the night of PCI due to probable extensive stent edge dissection into the first OM, this required at least 4-5 additional overlapping stents.   Seen in clinic 08/16/17 for symptoms concerning for unstable angina.     Cardiac cath this admission with patent circ stent. However, diffuse disease in the LAD. High grade stenosis in the RCA. Seen by CTCA for possible coronary artery bypass to the LAD versus stenting of the LAD with possible attempt to open the PD with which is chronically occluded.    Hospital Course     Consultants: CTCA  1. Unstable angina - Cath 08/16/17 showed patent circ stent. However, diffuse disease in the LAD. High grade stenosis in the RCA. Seen by CTCA for possible coronary artery bypass to the LAD versus stenting of the LAD with possible attempt to open the PD with which is chronically occluded. - After discussion with Dr. Excell Seltzerooper and phoned Dr. Tyrone SageGerhardt patient underwent  Successful PTCA of the Diagonal branch ostium, PTCA/DES x 1 mid LAD and PTCA/DES x 1 distal RCA/posterolateral artery. Ambulated well with cardiac rehab. CRP 2 at  [place]. Scr stable.  - Continue ASA, Effient, statin and BB.   2. HLD - 05/06/2017: Cholesterol, Total 148; HDL 44; LDL Calculated 78; Triglycerides 129 - Increase lipitor to 80mg  qd at discharge. LDL goal less than 70s. LFT and lipid panel in 6 weeks.   3. HTN - BP stable on current medications  The patient has been seen by Dr. Antoine Poche today and deemed ready for discharge home. All follow-up appointments have been scheduled. Discharge medications are listed below.     Discharge Vitals Blood pressure 125/68, pulse 79, temperature (!) 97.3 F (36.3 C), temperature source Oral, resp. rate 18, height 5\' 10"  (1.778 m), weight 263 lb 0.1 oz (119.3 kg), SpO2 92 %.  Filed Weights   08/17/17 0617 08/19/17 0634 08/20/17 0408  Weight: 257 lb 1.6 oz (116.6 kg) 257 lb 11.2 oz (116.9 kg) 263 lb 0.1 oz (119.3 kg)    Labs & Radiologic Studies     CBC Recent Labs    08/19/17 0415 08/20/17 0358  WBC 13.1* 10.8*  HGB 15.3 15.2  HCT 45.5 46.0  MCV 95.6 95.8  PLT 239 229   Basic Metabolic Panel Recent Labs    16/10/96 0358  NA 139  K 4.1  CL 107  CO2 25  GLUCOSE 105*  BUN 18  CREATININE 0.81  CALCIUM 8.6*     Ct Abdomen Pelvis W Contrast  Result Date: 08/03/2017 CLINICAL DATA:  Abdominal pain EXAM: CT ABDOMEN AND PELVIS WITH CONTRAST TECHNIQUE: Multidetector CT imaging of the abdomen and pelvis was performed using the standard protocol following bolus administration of intravenous contrast. CONTRAST:  ISOVUE-300 IOPAMIDOL (ISOVUE-300) INJECTION 61% COMPARISON:  None. FINDINGS: Lower chest: No pulmonary nodules or pleural effusion. No visible pericardial effusion. Hepatobiliary: Normal hepatic contours and density. No visible biliary dilatation. Normal gallbladder. Pancreas: Normal contours without ductal dilatation. No peripancreatic fluid collection. Spleen: Spleen is absent. Adrenals/Urinary Tract: --Adrenal glands: Normal. --Right kidney/ureter: No hydronephrosis or perinephric stranding. No nephrolithiasis. No obstructing ureteral stones. --Left kidney/ureter: No hydronephrosis or perinephric stranding. No nephrolithiasis. No obstructing ureteral stones. --Urinary bladder: Unremarkable. Stomach/Bowel: --Stomach/Duodenum: No hiatal hernia or other gastric abnormality. Normal duodenal course and caliber. --Small bowel: No dilatation or inflammation. --Colon: No focal abnormality. --Appendix: Not visualized. No right lower quadrant inflammation or  free fluid. Vascular/Lymphatic: Atherosclerotic calcification is present within the non-aneurysmal abdominal aorta, without hemodynamically significant stenosis. No abdominal or pelvic lymphadenopathy. Reproductive: Prostate calcifications. Musculoskeletal. No bony spinal canal stenosis or focal osseous abnormality. Other: None. IMPRESSION: 1. No acute abdominopelvic abnormality. 2.  Aortic Atherosclerosis (ICD10-I70.0). Electronically Signed   By: Deatra Robinson M.D.   On: 08/03/2017 01:39    Disposition   Pt is being discharged home today in good condition.  Follow-up Plans & Appointments    Follow-up Information    Jodelle Gross, NP. Go on 09/03/2017.   Specialties:  Nurse Practitioner, Radiology, Cardiology Why:  @10 :30am for hospital follow up Contact information: [address] [place] [address] [phone]          Discharge Instructions    Amb Referral to Cardiac Rehabilitation   Complete by:  As directed    Diagnosis:  Coronary Stents   Diet - low sodium heart healthy   Complete by:  As directed    Discharge instructions   Complete by:  As directed    No driving for 48. No lifting over 5 lbs for 1 week. No sexual activity for 1 week. You may return  to work on 08/26/17. Keep procedure site clean & dry. If you notice increased pain, swelling, bleeding or pus, call/return!  You may shower, but no soaking baths/hot tubs/pools for 1 week.   Increase activity slowly   Complete by:  As directed       Discharge Medications   Allergies as of 08/20/2017      Reactions   Niaspan [niacin Er] Other (See Comments)   Myalgias with elevated CPK while on both niaspan and a statin       Medication List    TAKE these medications   acetaminophen 325 MG tablet Commonly known as:  TYLENOL Take 650 mg by mouth every 6 (six) hours as needed for moderate pain or headache.   aspirin 81 MG tablet Take 1 tablet (81 mg total) by mouth daily. What changed:  when to  take this   atorvastatin 80 MG tablet Commonly known as:  LIPITOR Take 1 tablet (80 mg total) by mouth daily at 6 PM. What changed:    medication strength  See the new instructions.   calcium-vitamin D 500-200 MG-UNIT tablet Take 1 tablet by mouth every evening.   cholecalciferol 1000 units tablet Commonly known as:  VITAMIN D Take 1,000 Units by mouth every evening.   cyclobenzaprine 10 MG tablet Commonly known as:  FLEXERIL Take 1 tablet (10 mg total) by mouth 3 (three) times daily as needed for muscle spasms.   Fish Oil 1000 MG Caps Take 2 capsules (2,000 mg total) by mouth daily. What changed:  when to take this   HYDROcodone-acetaminophen 5-325 MG tablet Commonly known as:  NORCO/VICODIN Take 2 tablets by mouth every 4 (four) hours as needed.   lisinopril 5 MG tablet Commonly known as:  PRINIVIL,ZESTRIL Take 1 tablet (5 mg total) by mouth daily. Please schedule an appointment for refills What changed:    when to take this  additional instructions   metoprolol succinate 50 MG 24 hr tablet Commonly known as:  TOPROL-XL TAKE 1 TABLET (50 MG TOTAL) BY MOUTH DAILY. TAKE WITH OR IMMEDIATELY FOLLOWING A MEAL. What changed:  See the new instructions.   nitroGLYCERIN 0.4 MG SL tablet Commonly known as:  NITROSTAT Place 1 tablet (0.4 mg total) under the tongue every 5 (five) minutes as needed. What changed:  reasons to take this   omeprazole 40 MG capsule Commonly known as:  PRILOSEC TAKE 1 CAPSULE BY MOUTH ONCE DAILY What changed:    how much to take  how to take this  when to take this   prasugrel 10 MG Tabs tablet Commonly known as:  EFFIENT TAKE 1 TABLET (10 MG TOTAL) BY MOUTH DAILY. What changed:  when to take this   valACYclovir 500 MG tablet Commonly known as:  VALTREX TAKE 1 TABLET (500 MG TOTAL) BY MOUTH DAILY. What changed:  when to take this           Outstanding Labs/Studies   LFTS and lipid panel in 6 weeks  Duration of Discharge  Encounter   Greater than 30 minutes including physician time.  Signed, Shayda Kalka PA-C 08/20/2017, 10:26 AM

## 2017-09-03 ENCOUNTER — Encounter: Payer: Self-pay | Admitting: Adult Health

## 2017-09-03 ENCOUNTER — Ambulatory Visit (INDEPENDENT_AMBULATORY_CARE_PROVIDER_SITE_OTHER): Payer: 59 | Admitting: Adult Health

## 2017-09-03 VITALS — BP 120/78 | HR 67 | Ht 70.0 in | Wt 262.4 lb

## 2017-09-03 DIAGNOSIS — Z79899 Other long term (current) drug therapy: Secondary | ICD-10-CM | POA: Diagnosis not present

## 2017-09-03 DIAGNOSIS — I10 Essential (primary) hypertension: Secondary | ICD-10-CM | POA: Diagnosis not present

## 2017-09-03 DIAGNOSIS — E782 Mixed hyperlipidemia: Secondary | ICD-10-CM | POA: Diagnosis not present

## 2017-09-03 DIAGNOSIS — I2510 Atherosclerotic heart disease of native coronary artery without angina pectoris: Secondary | ICD-10-CM

## 2017-09-03 MED ORDER — ATORVASTATIN CALCIUM 80 MG PO TABS: 80.0000 mg | ORAL_TABLET | Freq: Every day | ORAL | 1 refills | Status: DC
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
## 2017-09-03 NOTE — Patient Instructions (Signed)
Medication Instructions:  NO CHANGES-Your physician recommends that you continue on your current medications as directed. Please refer to the Current Medication list given to you today.  If you need a refill on your cardiac medications before your next appointment, please call your pharmacy.  Labwork: LFT AND FASTING LIPID IN 1 MONTH (February) HERE IN OUR OFFICE AT [facility]  Take the provided lab slips for you to take with you to the lab for you blood draw.   You will need to fast. DO NOT EAT OR DRINK PAST MIDNIGHT.   You may go to any [facility] lab that is convenient for you however, we do have a lab in our office that is able to assist you. You do NOT need an appointment for our lab. Once in our office lobby there is a podium to the right of the check-in desk where you are to sign-in and ring a doorbell to alert us you are here. Lab is open Monday-Friday from 8:00am to 4:00pm; and is closed for lunch from 12:45p-1:45pm   Special Instructions: OK TO RETURN TO WORK WITH CURRENT RESTRICTIONS OF NO HEAVY LIFT AND EXTREME TEMPRATURES  Follow-Up: Your physician wants you to follow-up in: 3 MONTHS [facility]    Thank you for choosing [facility] at [facility]!!

## 2017-09-03 NOTE — Addendum Note (Signed)
Addended by: Alyson InglesBROOME, MICHELLE L on: 09/03/2017 11:14 AM   Modules accepted: Orders

## 2017-09-07 ENCOUNTER — Other Ambulatory Visit: Payer: Self-pay | Admitting: Family Medicine

## 2017-09-17 ENCOUNTER — Other Ambulatory Visit: Payer: 59

## 2017-09-17 DIAGNOSIS — I2510 Atherosclerotic heart disease of native coronary artery without angina pectoris: Secondary | ICD-10-CM | POA: Diagnosis not present

## 2017-09-17 DIAGNOSIS — E782 Mixed hyperlipidemia: Secondary | ICD-10-CM | POA: Diagnosis not present

## 2017-09-17 DIAGNOSIS — Z79899 Other long term (current) drug therapy: Secondary | ICD-10-CM | POA: Diagnosis not present

## 2017-09-18 ENCOUNTER — Telehealth: Payer: Self-pay

## 2017-09-18 LAB — HEPATIC FUNCTION PANEL
ALK PHOS: 118 IU/L — AB (ref 39–117)
ALT: 26 IU/L (ref 0–44)
AST: 23 IU/L (ref 0–40)
Albumin: 3.9 g/dL (ref 3.6–4.8)
Bilirubin Total: 0.4 mg/dL (ref 0.0–1.2)
Bilirubin, Direct: 0.09 mg/dL (ref 0.00–0.40)
TOTAL PROTEIN: 6.7 g/dL (ref 6.0–8.5)

## 2017-09-18 LAB — LIPID PANEL
CHOLESTEROL TOTAL: 144 mg/dL (ref 100–199)
Chol/HDL Ratio: 3.7 ratio (ref 0.0–5.0)
HDL: 39 mg/dL — ABNORMAL LOW (ref >39)
LDL CALC: 82 mg/dL (ref 0–99)
TRIGLYCERIDES: 117 mg/dL (ref 0–149)
VLDL Cholesterol Cal: 23 mg/dL (ref 5–40)

## 2017-09-18 NOTE — Telephone Encounter (Signed)
PT NOTIFIED OF NOTE BELOW.  FORWARDED TO PCP   Notes recorded by Lendon Colonel, NP on 09/18/2017 at 11:42 AM EST I have reviewed his labs. Essentially normal. Alk Phos is elevated. He will need to speak with PCP about this. All other liver enzymes are normal

## 2017-09-24 ENCOUNTER — Ambulatory Visit (INDEPENDENT_AMBULATORY_CARE_PROVIDER_SITE_OTHER): Payer: 59 | Admitting: Family Medicine

## 2017-09-24 ENCOUNTER — Encounter: Payer: Self-pay | Admitting: Family Medicine

## 2017-09-24 VITALS — BP 122/80 | HR 84 | Temp 96.7°F | Ht 70.0 in | Wt 259.0 lb

## 2017-09-24 DIAGNOSIS — R197 Diarrhea, unspecified: Secondary | ICD-10-CM | POA: Diagnosis not present

## 2017-09-24 MED ORDER — DIPHENOXYLATE-ATROPINE 2.5-0.025 MG PO TABS: 2.0000 | ORAL_TABLET | Freq: Four times a day (QID) | ORAL | 0 refills | Status: DC | PRN

## 2017-09-24 MED ORDER — BETAMETHASONE SOD PHOS & ACET 6 (3-3) MG/ML IJ SUSP
6.0000 mg | Freq: Once | INTRAMUSCULAR | Status: AC
  Administered 2017-09-24: 6 mg via INTRAMUSCULAR

## 2017-09-24 NOTE — Patient Instructions (Signed)
Hydrate with water or gatorade.   Bland Diet A bland diet consists of foods that do not have a lot of fat or fiber. Foods without fat or fiber are easier for the body to digest. They are also less likely to irritate your mouth, throat, stomach, and other parts of your gastrointestinal tract. A bland diet is sometimes called a BRAT diet. What is my plan? Your health care provider or dietitian may recommend specific changes to your diet to prevent and treat your symptoms, such as:  Eating small meals often.  Cooking food until it is soft enough to chew easily.  Chewing your food well.  Drinking fluids slowly.  Not eating foods that are very spicy, sour, or fatty.  Not eating citrus fruits, such as oranges and grapefruit.  What do I need to know about this diet?  Eat a variety of foods from the bland diet food list.  Do not follow a bland diet longer than you have to.  Ask your health care provider whether you should take vitamins. What foods can I eat? Grains  Hot cereals, such as cream of wheat. Bread, crackers, or tortillas made from refined white flour. Rice. Vegetables Canned or cooked vegetables. Mashed or boiled potatoes. Fruits Bananas. Applesauce. Other types of cooked or canned fruit with the skin and seeds removed, such as canned peaches or pears. Meats and Other Protein Sources Scrambled eggs. Creamy peanut butter or other nut butters. Lean, well-cooked meats, such as chicken or fish. Tofu. Soups or broths. Dairy Low-fat dairy products, such as milk, cottage cheese, or yogurt. Beverages Water. Herbal tea. Apple juice. Sweets and Desserts Pudding. Custard. Fruit gelatin. Ice cream. Fats and Oils Mild salad dressings. Canola or olive oil. The items listed above may not be a complete list of allowed foods or beverages. Contact your dietitian for more options. What foods are not recommended? Foods and ingredients that are often not recommended include:  Spicy  foods, such as hot sauce or salsa.  Fried foods.  Sour foods, such as pickled or fermented foods.  Raw vegetables or fruits, especially citrus or berries.  Caffeinated drinks.  Alcohol.  Strongly flavored seasonings or condiments.  The items listed above may not be a complete list of foods and beverages that are not allowed. Contact your dietitian for more information. This information is not intended to replace advice given to you by your health care provider. Make sure you discuss any questions you have with your health care provider. Document Released: 11/21/2015 Document Revised: 01/05/2016 Document Reviewed: 08/11/2014 Elsevier Interactive Patient Education  2018 [employer]

## 2017-09-24 NOTE — Progress Notes (Signed)
Subjective:  Patient ID: [name], male    DOB: 1956-01-18  Age: 62 y.o. MRN: [id number]  CC: Diarrhea (pt here today c/o diarrhea x 4 days)   HPI [name] presents for several days of painless diarrhea.  He is having too numerous to count stools.  After 2 days he started using some Imodium and that gave him relief for a few hours but it has returned.  He has had no fever chills or sweats.  He does have a significant amount of  respiratory symptoms including some wheezing.  This is been going on for 2 months.  2 months ago he had some chest and shoulder pain and had to get 2 stents placed.  The only change however in his medication at that time was an increase in the dose of atorvastatin.  He has not had any change in skin color.  No jaundice.  Approximately 2 months ago he did have a bout of abdominal pain in the emergency department.  A CT scan of the abdomen was performed at that time.  No acute abnormality was noted.  Patient denies having had any hematochezia or melena.  No nausea or vomiting  Depression screen [facility] 2/9 09/24/2017 05/06/2017 03/26/2017  Decreased Interest 0 0 0  Down, Depressed, Hopeless 0 0 0  PHQ - 2 Score 0 0 0    History [name] has a past medical history of Allergy, Coronary artery disease, GERD (gastroesophageal reflux disease), Hyperlipidemia, Hypertension, Kidney stones, Low serum vitamin D, Myocardial infarction ([place]) (12/97,1999,2002,2003,2012), and Oral herpes simplex infection.   He has a past surgical history that includes Splenectomy; Tonsillectomy and adenoidectomy; stents; Leg wound repair / closure (Left); ear (Right, 1966); LEFT HEART CATH AND CORONARY ANGIOGRAPHY (N/A, 08/16/2017); CORONARY STENT INTERVENTION (N/A, 08/19/2017); CORONARY ANGIOGRAPHY (N/A, 08/19/2017); and CORONARY BALLOON ANGIOPLASTY (N/A, 08/19/2017).   His family history includes Coronary artery disease in his father and paternal grandfather; Diabetes in his father; Heart attack in his  brother.He reports that he quit smoking about 7 years ago. he has never used smokeless tobacco. He reports that he drinks alcohol. He reports that he does not use drugs.    ROS Review of Systems  Constitutional: Negative for chills, diaphoresis, fever and unexpected weight change.  HENT: Negative for congestion, hearing loss, rhinorrhea and sore throat.   Eyes: Negative for visual disturbance.  Respiratory: Positive for cough and wheezing. Negative for shortness of breath.   Cardiovascular: Negative for chest pain.  Gastrointestinal: Positive for diarrhea. Negative for abdominal pain and constipation.  Genitourinary: Negative for dysuria and flank pain.  Musculoskeletal: Negative for arthralgias and joint swelling.  Skin: Negative for rash.  Neurological: Negative for dizziness and headaches.  Psychiatric/Behavioral: Negative for dysphoric mood and sleep disturbance.    Objective:  BP 122/80   Pulse 84   Temp (!) 96.7 F (35.9 C) (Oral)   Ht _0  (1.778 m)   Wt 259 lb (117.5 kg)   BMI 37.16 kg/m   BP Readings from Last 3 Encounters:  09/24/17 122/80  09/03/17 120/78  08/20/17 125/68    Wt Readings from Last 3 Encounters:  09/24/17 259 lb (117.5 kg)  09/03/17 262 lb 6.4 oz (119 kg)  08/20/17 263 lb 0.1 oz (119.3 kg)     Physical Exam  Constitutional: He is oriented to person, place, and time. He appears well-developed and well-nourished. No distress.  HENT:  Head: Normocephalic and atraumatic.  Right Ear: External ear normal.  Left  Ear: External ear normal.  Nose: Nose normal.  Mouth/Throat: Oropharynx is clear and moist.  Eyes: Conjunctivae and EOM are normal. Pupils are equal, round, and reactive to light.  Neck: Normal range of motion. Neck supple. No thyromegaly present.  Cardiovascular: Normal rate, regular rhythm and normal heart sounds.  No murmur heard. Pulmonary/Chest: Effort normal. No respiratory distress. He has wheezes. He has no rales.  Abdominal:  Soft. Bowel sounds are normal. He exhibits no distension. There is no tenderness.  Lymphadenopathy:    He has no cervical adenopathy.  Neurological: He is alert and oriented to person, place, and time. He has normal reflexes.  Skin: Skin is warm and dry.  Psychiatric: He has a normal mood and affect. His behavior is normal. Judgment and thought content normal.      Assessment & Plan:   [name] was seen today for diarrhea.  Diagnoses and all orders for this visit:  Diarrhea, unspecified type -     Cdiff NAA+O+P+Stool Culture -     Giardia/Cryptosporidium EIA -     CBC with Differential/Platelet -     CMP14+EGFR -     Amylase -     Lipase -     Pancreatic elastase, fecal -     betamethasone acetate-betamethasone sodium phosphate (CELESTONE) injection 6 mg  Other orders -     diphenoxylate-atropine (LOMOTIL) 2.5-0.025 MG tablet; Take 2 tablets by mouth 4 (four) times daily as needed for diarrhea or loose stools.       I am having [name]. Tardiff start on diphenoxylate-atropine. I am also having him maintain his acetaminophen, calcium-vitamin D, aspirin, Fish Oil, valACYclovir, metoprolol succinate, cholecalciferol, prasugrel, omeprazole, nitroGLYCERIN, lisinopril, and atorvastatin. We administered betamethasone acetate-betamethasone sodium phosphate.  Allergies as of 09/24/2017   No Known Allergies     Medication List        Accurate as of 09/24/17  7:15 PM. Always use your most recent med list.          acetaminophen 325 MG tablet Commonly known as:  TYLENOL Take 650 mg by mouth every 6 (six) hours as needed for moderate pain or headache.   aspirin 81 MG tablet Take 1 tablet (81 mg total) by mouth daily.   atorvastatin 80 MG tablet Commonly known as:  LIPITOR Take 1 tablet (80 mg total) by mouth daily at 6 PM.   calcium-vitamin D 500-200 MG-UNIT tablet Take 1 tablet by mouth every evening.   cholecalciferol 1000 units tablet Commonly known as:  VITAMIN D Take  1,000 Units by mouth every evening.   diphenoxylate-atropine 2.5-0.025 MG tablet Commonly known as:  LOMOTIL Take 2 tablets by mouth 4 (four) times daily as needed for diarrhea or loose stools.   Fish Oil 1000 MG Caps Take 2 capsules (2,000 mg total) by mouth daily.   lisinopril 5 MG tablet Commonly known as:  PRINIVIL,ZESTRIL Take 1 tablet (5 mg total) by mouth daily. Please schedule an appointment for refills   metoprolol succinate 50 MG 24 hr tablet Commonly known as:  TOPROL-XL TAKE 1 TABLET (50 MG TOTAL) BY MOUTH DAILY. TAKE WITH OR IMMEDIATELY FOLLOWING A MEAL.   nitroGLYCERIN 0.4 MG SL tablet Commonly known as:  NITROSTAT Place 1 tablet (0.4 mg total) under the tongue every 5 (five) minutes as needed.   omeprazole 40 MG capsule Commonly known as:  PRILOSEC TAKE 1 CAPSULE BY MOUTH ONCE DAILY   prasugrel 10 MG Tabs tablet Commonly known as:  EFFIENT TAKE 1 TABLET (  10 MG TOTAL) BY MOUTH DAILY.   valACYclovir 500 MG tablet Commonly known as:  VALTREX TAKE 1 TABLET (500 MG TOTAL) BY MOUTH DAILY.        Follow-up: Return in about 2 weeks (around 10/08/2017), or if symptoms worsen or fail to improve.  Claretta Fraise, M.D.

## 2017-09-25 ENCOUNTER — Other Ambulatory Visit: Payer: Self-pay | Admitting: Physician Assistant

## 2017-09-26 ENCOUNTER — Other Ambulatory Visit: Payer: 59

## 2017-09-26 DIAGNOSIS — R197 Diarrhea, unspecified: Secondary | ICD-10-CM | POA: Diagnosis not present

## 2017-09-26 LAB — CMP14+EGFR
ALT: 34 [IU]/L (ref 0–44)
AST: 22 [IU]/L (ref 0–40)
Albumin/Globulin Ratio: 1.5 (ref 1.2–2.2)
Albumin: 4.1 g/dL (ref 3.6–4.8)
Alkaline Phosphatase: 130 [IU]/L — ABNORMAL HIGH (ref 39–117)
BUN/Creatinine Ratio: 21 (ref 10–24)
BUN: 16 mg/dL (ref 8–27)
Bilirubin Total: 0.6 mg/dL (ref 0.0–1.2)
CO2: 17 mmol/L — ABNORMAL LOW (ref 20–29)
Calcium: 9.1 mg/dL (ref 8.6–10.2)
Chloride: 105 mmol/L (ref 96–106)
Creatinine, Ser: 0.78 mg/dL (ref 0.76–1.27)
GFR calc Af Amer: 113 mL/min/{1.73_m2}
GFR calc non Af Amer: 98 mL/min/{1.73_m2}
Globulin, Total: 2.7 g/dL (ref 1.5–4.5)
Glucose: 91 mg/dL (ref 65–99)
Potassium: 5.5 mmol/L — ABNORMAL HIGH (ref 3.5–5.2)
Sodium: 141 mmol/L (ref 134–144)
Total Protein: 6.8 g/dL (ref 6.0–8.5)

## 2017-09-26 LAB — LIPASE: Lipase: 24 U/L (ref 13–78)

## 2017-09-26 LAB — CBC WITH DIFFERENTIAL/PLATELET
Basophils Absolute: 0.1 10*3/uL (ref 0.0–0.2)
Basos: 0 %
EOS (ABSOLUTE): 0.3 10*3/uL (ref 0.0–0.4)
Eos: 2 %
Hematocrit: 47.4 % (ref 37.5–51.0)
Hemoglobin: 16.8 g/dL (ref 13.0–17.7)
Immature Grans (Abs): 0 10*3/uL (ref 0.0–0.1)
Immature Granulocytes: 0 %
Lymphocytes Absolute: 4.5 10*3/uL — ABNORMAL HIGH (ref 0.7–3.1)
Lymphs: 42 %
MCH: 33.2 pg — ABNORMAL HIGH (ref 26.6–33.0)
MCHC: 35.4 g/dL (ref 31.5–35.7)
MCV: 94 fL (ref 79–97)
Monocytes Absolute: 1.8 10*3/uL — ABNORMAL HIGH (ref 0.1–0.9)
Monocytes: 16 %
Neutrophils Absolute: 4.5 10*3/uL (ref 1.4–7.0)
Neutrophils: 40 %
Platelets: 273 10*3/uL (ref 150–379)
RBC: 5.06 x10E6/uL (ref 4.14–5.80)
RDW: 14.1 % (ref 12.3–15.4)
WBC: 11.2 10*3/uL — ABNORMAL HIGH (ref 3.4–10.8)

## 2017-09-26 LAB — AMYLASE: Amylase: 54 U/L (ref 31–124)

## 2017-09-27 LAB — GIARDIA/CRYPTOSPORIDIUM EIA
CRYPTOSPORIDIUM EIA: NEGATIVE
GIARDIA AG STL: NEGATIVE

## 2017-09-30 LAB — PANCREATIC ELASTASE, FECAL: Pancreatic Elastase, Fecal: >500 ug Elast./g (ref >200)

## 2017-10-01 LAB — CDIFF NAA+O+P+STOOL CULTURE
CDIFFPCR: NEGATIVE
E COLI SHIGA TOXIN ASSAY: NEGATIVE

## 2017-10-11 ENCOUNTER — Other Ambulatory Visit: Payer: Self-pay | Admitting: Family Medicine

## 2017-10-11 ENCOUNTER — Other Ambulatory Visit: Payer: Self-pay | Admitting: Cardiology

## 2017-10-23 ENCOUNTER — Telehealth: Payer: Self-pay | Admitting: Cardiology

## 2017-10-23 NOTE — Telephone Encounter (Signed)
Closed Encounter  °

## 2017-11-05 ENCOUNTER — Ambulatory Visit: Payer: 59 | Admitting: Family Medicine

## 2017-11-13 ENCOUNTER — Ambulatory Visit: Payer: Self-pay | Admitting: Cardiology

## 2017-11-20 ENCOUNTER — Ambulatory Visit: Payer: Self-pay | Admitting: Cardiology

## 2017-12-11 ENCOUNTER — Encounter: Payer: Self-pay | Admitting: Family Medicine

## 2017-12-11 ENCOUNTER — Ambulatory Visit (INDEPENDENT_AMBULATORY_CARE_PROVIDER_SITE_OTHER): Payer: 59

## 2017-12-11 ENCOUNTER — Ambulatory Visit (INDEPENDENT_AMBULATORY_CARE_PROVIDER_SITE_OTHER): Payer: 59 | Admitting: Family Medicine

## 2017-12-11 VITALS — BP 109/62 | HR 61 | Temp 97.3°F | Ht 70.0 in | Wt 264.0 lb

## 2017-12-11 DIAGNOSIS — E782 Mixed hyperlipidemia: Secondary | ICD-10-CM

## 2017-12-11 DIAGNOSIS — M791 Myalgia, unspecified site: Secondary | ICD-10-CM

## 2017-12-11 DIAGNOSIS — I2510 Atherosclerotic heart disease of native coronary artery without angina pectoris: Secondary | ICD-10-CM

## 2017-12-11 DIAGNOSIS — Z8249 Family history of ischemic heart disease and other diseases of the circulatory system: Secondary | ICD-10-CM

## 2017-12-11 DIAGNOSIS — I10 Essential (primary) hypertension: Secondary | ICD-10-CM | POA: Diagnosis not present

## 2017-12-11 DIAGNOSIS — E559 Vitamin D deficiency, unspecified: Secondary | ICD-10-CM

## 2017-12-11 MED ORDER — FLUTICASONE PROPIONATE 50 MCG/ACT NA SUSP: 2.0000 | Freq: Every day | NASAL | 6 refills | Status: DC

## 2017-12-11 MED ORDER — CLOTRIMAZOLE-BETAMETHASONE 1-0.05 % EX CREA: 1.0000 "application " | TOPICAL_CREAM | Freq: Two times a day (BID) | CUTANEOUS | 2 refills | Status: DC

## 2017-12-11 NOTE — Progress Notes (Signed)
Subjective:    Patient ID: [name], male    DOB: 1955/09/20, 62 y.o.   MRN: [id number]  HPI Pt here for follow up and management of chronic medical problems which includes hypertension and hyperlipidemia. He is taking medication regularly.  Patient is doing well overall and has no specific complaints.  He is requesting a refill on some of his cream.  He is due to get lab work today a chest x-ray today and will be given an FOBT to return.  He is never had a colonoscopy and he is 62 years old.  The patient did have a recent stent insertion by the cardiologist and January of this year.  This followed an admission on January 4 of this year.  He had a left heart catheterization.  He has severe stenosis in the mid LAD at the bifurcation and also has severe stenosis in the distal right coronary posterior lateral area.  Patient today mainly complains of fatigue and he attributes some of this to the additional dose of atorvastatin which is 80 mg and it is especially bad in the morning when he he wakes.  We will add a CK to the blood work today.  He denies any chest pain or any additional shortness of breath.  He is not having any trouble with his stomach including nausea vomiting diarrhea blood in the stool or black tarry bowel movements.  He has never had a colonoscopy and because of the recent stent insertion would not be able to go off of any of his blood thinners currently we will look at giving him a Cologuard test to check for GI malignancy.  He is passing his water without problems.  We will also consider switching him off of his omega-3 fatty acids and switching him to Vascepa when the current blood work is returned but only after talking with his cardiologist.      Patient Active Problem List   Diagnosis Date Noted  . Unstable angina ([place]) 08/18/2017  . Family history of heart disease 10/19/2015  . Vitamin D deficiency 03/24/2015  . Metabolic syndrome 41/66/0630  . Hyperlipidemia, mixed  02/09/2015  . Obesity (BMI 30-39.9) 09/20/2010  . TOBACCO ABUSE 09/20/2010  . Hypertensive heart disease 09/20/2010  . CAD (coronary artery disease), native coronary artery 09/20/2010  . GERD 09/20/2010  . Nephrolithiasis 09/20/2010   Outpatient Encounter Medications as of 12/11/2017  Medication Sig  . acetaminophen (TYLENOL) 325 MG tablet Take 650 mg by mouth every 6 (six) hours as needed for moderate pain or headache.   Marland Kitchen aspirin 81 MG tablet Take 1 tablet (81 mg total) by mouth daily. (Patient taking differently: Take 81 mg by mouth every evening. )  . atorvastatin (LIPITOR) 80 MG tablet Take 1 tablet (80 mg total) by mouth daily at 6 PM.  . Calcium Carbonate-Vitamin D (CALCIUM-VITAMIN D) 500-200 MG-UNIT per tablet Take 1 tablet by mouth every evening.   . cholecalciferol (VITAMIN D) 1000 units tablet Take 1,000 Units by mouth every evening.   . clotrimazole-betamethasone (LOTRISONE) cream Apply 1 application topically 2 (two) times daily.  Marland Kitchen lisinopril (PRINIVIL,ZESTRIL) 5 MG tablet Take 1 tablet (5 mg total) by mouth daily. Please schedule an appointment for refills  . metoprolol succinate (TOPROL-XL) 50 MG 24 hr tablet TAKE 1 TABLET (50 MG TOTAL) BY MOUTH DAILY. TAKE WITH OR IMMEDIATELY FOLLOWING A MEAL. (Patient taking differently: TAKE 1 TABLET (50 MG TOTAL) BY MOUTH DAILY IN THE EVENING. TAKE WITH OR IMMEDIATELY FOLLOWING  A MEAL.)  . nitroGLYCERIN (NITROSTAT) 0.4 MG SL tablet Place 1 tablet (0.4 mg total) under the tongue every 5 (five) minutes as needed. (Patient taking differently: Place 0.4 mg under the tongue every 5 (five) minutes as needed for chest pain. )  . Omega-3 Fatty Acids (FISH OIL) 1000 MG CAPS Take 2 capsules (2,000 mg total) by mouth daily. (Patient taking differently: Take 2 capsules by mouth every evening. )  . omeprazole (PRILOSEC) 40 MG capsule TAKE 1 CAPSULE BY MOUTH ONCE DAILY  . prasugrel (EFFIENT) 10 MG TABS tablet TAKE 1 TABLET (10 MG TOTAL) BY MOUTH DAILY.  .  valACYclovir (VALTREX) 500 MG tablet TAKE 1 TABLET BY MOUTH EVERY DAY  . [DISCONTINUED] diphenoxylate-atropine (LOMOTIL) 2.5-0.025 MG tablet Take 2 tablets by mouth 4 (four) times daily as needed for diarrhea or loose stools.   No facility-administered encounter medications on file as of 12/11/2017.      Review of Systems  Constitutional: Negative.   HENT: Negative.   Eyes: Negative.   Respiratory: Negative.   Cardiovascular: Negative.   Gastrointestinal: Negative.   Endocrine: Negative.   Genitourinary: Negative.   Musculoskeletal: Negative.   Skin: Negative.   Allergic/Immunologic: Negative.   Neurological: Negative.   Hematological: Negative.   Psychiatric/Behavioral: Negative.        Objective:   Physical Exam  Constitutional: He is oriented to person, place, and time. He appears well-developed and well-nourished. No distress.  Patient is pleasant and alert and feeling better since his stent placement and angioplasty in January.  He is still tired somewhat.  He is back to work.  HENT:  Head: Normocephalic and atraumatic.  Right Ear: External ear normal.  Left Ear: External ear normal.  Mouth/Throat: Oropharynx is clear and moist. No oropharyngeal exudate.  Nasal turbinate congestion bilaterally  Eyes: Pupils are equal, round, and reactive to light. Conjunctivae and EOM are normal. Right eye exhibits no discharge. Left eye exhibits no discharge. No scleral icterus.  Neck: Normal range of motion. Neck supple. No thyromegaly present.  No bruits thyromegaly or anterior cervical adenopathy  Cardiovascular: Normal rate, regular rhythm and normal heart sounds. Exam reveals no gallop.  Heart is regular at 60/min.  Distal pulses were present but difficult to palpate.  Especially the dorsalis pedis pulses.  Pulmonary/Chest: Effort normal and breath sounds normal. No respiratory distress. He has no wheezes. He exhibits no tenderness.  No axillary adenopathy and no chest wall masses  with good breath sounds anteriorly and posteriorly  Abdominal: Soft. Bowel sounds are normal. He exhibits no mass. There is no tenderness. There is no rebound and no guarding.  Abdominal obesity without masses tenderness or organ enlargement.  Inguinal pulses were also somewhat difficult to palpate primarily because of patient size.  Musculoskeletal: Normal range of motion. He exhibits no edema, tenderness or deformity.  Lymphadenopathy:    He has no cervical adenopathy.  Neurological: He is alert and oriented to person, place, and time. He has normal reflexes. No cranial nerve deficit.  Skin: Skin is warm and dry. No erythema.  Psychiatric: He has a normal mood and affect. His behavior is normal. Judgment and thought content normal.  In normal limits other than fatigue.  Nursing note and vitals reviewed.  BP 109/62 (BP Location: Left Arm)   Pulse 61   Temp (!) 97.3 F (36.3 C) (Oral)   Ht '5\' 10"'  (1.778 m)   Wt 264 lb (119.7 kg)   BMI 37.88 kg/m  Assessment & Plan:  1. Hyperlipidemia, mixed -Continue with current treatment including omega-3 fatty acids pending results of lab work. -We will check a CK because of his increased muscle soreness especially with waking up in the morning and will make a decision along with the cardiologist about whether to continue with the current dose of statin once the lab work is returned - CBC with Differential/Platelet - Lipid panel - DG Chest 2 View; Future  2. Essential hypertension -The blood pressure is good he will continue with current treatment - BMP8+EGFR - CBC with Differential/Platelet - Hepatic function panel - DG Chest 2 View; Future  3. Vitamin D deficiency -Continue with vitamin D replacement pending results of lab work - CBC with Differential/Platelet - VITAMIN D 25 Hydroxy (Vit-D Deficiency, Fractures)  4. Atherosclerosis of native coronary artery of native heart without angina pectoris -Continue follow-up with  cardiology and aggressive medical management with statin therapy - CBC with Differential/Platelet - Lipid panel  5. Family history of heart disease - CBC with Differential/Platelet  6. Muscle soreness - CK  7.  Morbid obesity -Continue with aggressive therapeutic lifestyle changes especially with diet and walking.  Meds ordered this encounter  Medications  . clotrimazole-betamethasone (LOTRISONE) cream    Sig: Apply 1 application topically 2 (two) times daily.    Dispense:  30 g    Refill:  2  . fluticasone (FLONASE) 50 MCG/ACT nasal spray    Sig: Place 2 sprays into both nostrils daily.    Dispense:  16 g    Refill:  6   Patient Instructions  Continue current medications. Continue good therapeutic lifestyle changes which include good diet and exercise. Fall precautions discussed with patient. If an FOBT was given today- please return it to our front desk. If you are over 43 years old - you may need Prevnar 41 or the adult Pneumonia vaccine.  **Flu shots are available--- please call and schedule a FLU-CLINIC appointment**  After your visit with [place] today you will receive a survey in the mail or online from [employer] regarding your care with [place]. Please take a moment to fill this out. Your feedback is very important to [place] as you can help [place] better understand your patient needs as well as improve your experience and satisfaction. WE CARE ABOUT YOU!!!   Follow-up with cardiology as planned We will consider switching you to vascepa once your lab work is returned We will also call in a prescription for Flonase because of your allergic rhinitis We will also give you a prescription to get a Cologuard test since you would not be able to do a colonoscopy at this time.  Arrie Senate MD

## 2017-12-11 NOTE — Patient Instructions (Addendum)
Continue current medications. Continue good therapeutic lifestyle changes which include good diet and exercise. Fall precautions discussed with patient. If an FOBT was given today- please return it to our front desk. If you are over 61 years old - you may need Prevnar 13 or the adult Pneumonia vaccine.  **Flu shots are available--- please call and schedule a FLU-CLINIC appointment**  After your visit with [place] today you will receive a survey in the mail or online from [employer] regarding your care with [place]. Please take a moment to fill this out. Your feedback is very important to [place] as you can help [place] better understand your patient needs as well as improve your experience and satisfaction. WE CARE ABOUT YOU!!!   Follow-up with cardiology as planned We will consider switching you to vascepa once your lab work is returned We will also call in a prescription for Flonase because of your allergic rhinitis We will also give you a prescription to get a Cologuard test since you would not be able to do a colonoscopy at this time.

## 2017-12-12 LAB — CBC WITH DIFFERENTIAL/PLATELET
Basophils Absolute: 0 10*3/uL (ref 0.0–0.2)
Basos: 0 %
EOS (ABSOLUTE): 0.3 10*3/uL (ref 0.0–0.4)
EOS: 4 %
Hematocrit: 47.6 % (ref 37.5–51.0)
Hemoglobin: 16.3 g/dL (ref 13.0–17.7)
IMMATURE GRANS (ABS): 0 10*3/uL (ref 0.0–0.1)
IMMATURE GRANULOCYTES: 0 %
Lymphocytes Absolute: 4.2 10*3/uL — ABNORMAL HIGH (ref 0.7–3.1)
Lymphs: 48 %
MCH: 32.9 pg (ref 26.6–33.0)
MCHC: 34.2 g/dL (ref 31.5–35.7)
MCV: 96 fL (ref 79–97)
MONOS ABS: 1 10*3/uL — AB (ref 0.1–0.9)
Monocytes: 12 %
NEUTROS PCT: 36 %
Neutrophils Absolute: 3.1 10*3/uL (ref 1.4–7.0)
Platelets: 244 10*3/uL (ref 150–379)
RBC: 4.95 x10E6/uL (ref 4.14–5.80)
RDW: 15.1 % (ref 12.3–15.4)
WBC: 8.7 10*3/uL (ref 3.4–10.8)

## 2017-12-12 LAB — HEPATIC FUNCTION PANEL
ALK PHOS: 123 IU/L — AB (ref 39–117)
ALT: 30 IU/L (ref 0–44)
AST: 26 IU/L (ref 0–40)
Albumin: 3.7 g/dL (ref 3.6–4.8)
BILIRUBIN TOTAL: 0.3 mg/dL (ref 0.0–1.2)
Bilirubin, Direct: 0.12 mg/dL (ref 0.00–0.40)
Total Protein: 6.2 g/dL (ref 6.0–8.5)

## 2017-12-12 LAB — BMP8+EGFR
BUN / CREAT RATIO: 26 — AB (ref 10–24)
BUN: 19 mg/dL (ref 8–27)
CHLORIDE: 108 mmol/L — AB (ref 96–106)
CO2: 20 mmol/L (ref 20–29)
Calcium: 8.9 mg/dL (ref 8.6–10.2)
Creatinine, Ser: 0.73 mg/dL — ABNORMAL LOW (ref 0.76–1.27)
GFR calc non Af Amer: 101 mL/min/{1.73_m2} (ref >59)
GFR, EST AFRICAN AMERICAN: 117 mL/min/{1.73_m2} (ref >59)
Glucose: 118 mg/dL — ABNORMAL HIGH (ref 65–99)
POTASSIUM: 4.4 mmol/L (ref 3.5–5.2)
Sodium: 143 mmol/L (ref 134–144)

## 2017-12-12 LAB — LIPID PANEL
Chol/HDL Ratio: 3.5 ratio (ref 0.0–5.0)
Cholesterol, Total: 130 mg/dL (ref 100–199)
HDL: 37 mg/dL — AB (ref >39)
LDL Calculated: 71 mg/dL (ref 0–99)
TRIGLYCERIDES: 109 mg/dL (ref 0–149)
VLDL CHOLESTEROL CAL: 22 mg/dL (ref 5–40)

## 2017-12-12 LAB — VITAMIN D 25 HYDROXY (VIT D DEFICIENCY, FRACTURES): Vit D, 25-Hydroxy: 28.8 ng/mL — ABNORMAL LOW (ref 30.0–100.0)

## 2017-12-13 LAB — SPECIMEN STATUS REPORT

## 2017-12-13 LAB — CK: Total CK: 102 U/L (ref 24–204)
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
## 2017-12-25 ENCOUNTER — Ambulatory Visit (INDEPENDENT_AMBULATORY_CARE_PROVIDER_SITE_OTHER): Payer: 59 | Admitting: Cardiology

## 2017-12-25 ENCOUNTER — Encounter: Payer: Self-pay | Admitting: Cardiology

## 2017-12-25 VITALS — BP 110/80 | HR 72 | Ht 70.0 in | Wt 266.0 lb

## 2017-12-25 DIAGNOSIS — I2510 Atherosclerotic heart disease of native coronary artery without angina pectoris: Secondary | ICD-10-CM

## 2017-12-25 DIAGNOSIS — M791 Myalgia, unspecified site: Secondary | ICD-10-CM

## 2017-12-25 DIAGNOSIS — E785 Hyperlipidemia, unspecified: Secondary | ICD-10-CM

## 2017-12-25 MED ORDER — ROSUVASTATIN CALCIUM 40 MG PO TABS: 40.0000 mg | ORAL_TABLET | Freq: Every day | ORAL | 3 refills | Status: DC

## 2017-12-25 NOTE — Patient Instructions (Signed)
Medication Instructions:  Please discontinue your Atorvastatin and start Crestor 40 mg a day. Continue all other medications as listed.  Follow-Up: Follow up in 6 months with Dr. Antoine Poche.  You will receive a letter in the mail 2 months before you are due.  Please call us when you receive this letter to schedule your follow up appointment.  If you need a refill on your cardiac medications before your next appointment, please call your pharmacy.  Thank you for choosing [facility]!!

## 2018-01-12 ENCOUNTER — Other Ambulatory Visit: Payer: Self-pay | Admitting: Cardiology

## 2018-01-13 NOTE — Telephone Encounter (Signed)
REFILL 

## 2018-03-25 ENCOUNTER — Other Ambulatory Visit: Payer: Self-pay | Admitting: Family Medicine

## 2018-04-09 ENCOUNTER — Other Ambulatory Visit: Payer: Self-pay | Admitting: Family Medicine

## 2018-05-05 ENCOUNTER — Telehealth: Payer: Self-pay | Admitting: Family Medicine

## 2018-05-05 NOTE — Telephone Encounter (Signed)
Pt is needing a referral for sleep study not sure if he actually needs to see Dr Christell ConstantMoore or if we can refer him

## 2018-05-05 NOTE — Telephone Encounter (Signed)
Returning call.

## 2018-05-05 NOTE — Telephone Encounter (Signed)
LM - usually a 6 mo pt - need to know why he needs a sooner appt.

## 2018-05-06 ENCOUNTER — Telehealth: Payer: Self-pay | Admitting: Family Medicine

## 2018-05-06 NOTE — Telephone Encounter (Signed)
Lm 9/24-jhb

## 2018-05-06 NOTE — Telephone Encounter (Signed)
[place] with pt - this was DUP phone note

## 2018-05-13 ENCOUNTER — Other Ambulatory Visit: Payer: Self-pay | Admitting: Cardiology

## 2018-05-28 ENCOUNTER — Ambulatory Visit (INDEPENDENT_AMBULATORY_CARE_PROVIDER_SITE_OTHER): Payer: 59 | Admitting: Family Medicine

## 2018-05-28 ENCOUNTER — Encounter: Payer: Self-pay | Admitting: Family Medicine

## 2018-05-28 VITALS — BP 124/76 | HR 63 | Temp 97.0°F | Ht 70.0 in | Wt 270.0 lb

## 2018-05-28 DIAGNOSIS — Z0000 Encounter for general adult medical examination without abnormal findings: Secondary | ICD-10-CM | POA: Diagnosis not present

## 2018-05-28 DIAGNOSIS — R8290 Unspecified abnormal findings in urine: Secondary | ICD-10-CM | POA: Diagnosis not present

## 2018-05-28 DIAGNOSIS — R5383 Other fatigue: Secondary | ICD-10-CM | POA: Diagnosis not present

## 2018-05-28 DIAGNOSIS — I10 Essential (primary) hypertension: Secondary | ICD-10-CM

## 2018-05-28 DIAGNOSIS — Z23 Encounter for immunization: Secondary | ICD-10-CM

## 2018-05-28 DIAGNOSIS — Z8249 Family history of ischemic heart disease and other diseases of the circulatory system: Secondary | ICD-10-CM

## 2018-05-28 DIAGNOSIS — G479 Sleep disorder, unspecified: Secondary | ICD-10-CM

## 2018-05-28 DIAGNOSIS — E782 Mixed hyperlipidemia: Secondary | ICD-10-CM | POA: Diagnosis not present

## 2018-05-28 DIAGNOSIS — E559 Vitamin D deficiency, unspecified: Secondary | ICD-10-CM

## 2018-05-28 DIAGNOSIS — I2510 Atherosclerotic heart disease of native coronary artery without angina pectoris: Secondary | ICD-10-CM

## 2018-05-28 DIAGNOSIS — R0683 Snoring: Secondary | ICD-10-CM

## 2018-05-28 LAB — URINALYSIS, COMPLETE
Bilirubin, UA: NEGATIVE
Glucose, UA: NEGATIVE
Ketones, UA: NEGATIVE
Nitrite, UA: NEGATIVE
Specific Gravity, UA: 1.02 (ref 1.005–1.030)
UUROB: 0.2 mg/dL (ref 0.2–1.0)
pH, UA: 5.5 (ref 5.0–7.5)

## 2018-05-28 LAB — MICROSCOPIC EXAMINATION

## 2018-05-28 NOTE — Progress Notes (Signed)
Subjective:    Patient ID: [name], male    DOB: 1956/01/18, 62 y.o.   MRN: [id number]  HPI  Patient is here today for annual wellness exam and follow up of chronic medical problems which includes hyperlipidemia. He is taking medication regularly.  He comes in today for a complete physical.  He is concerned that he may have sleep apnea and we will discuss this with him during the visit today.  He does not sleep well and he stays very fatigued during the day with no energy.  The patient has a history of allergies GERD hyperlipidemia hypertension and an MI in the past.  There is a strong family history of heart disease and circulation problems.  Patient had stents put in this past January 2019.  He has seen the cardiologist on several occasions and continues to have follow-up visits because of this.  Since doing the stents he has had less chest pain but cannot tell a lot of difference with his shortness of breath which he says is about the same.  He does complain with some problems with swallowing and has reflux symptoms.  He cannot say today that this is any worse than usual.  He denies any blood in the stool or black tarry bowel movements.  He is passing his water well with no specific complaints.  He said the sleep issues have been going on for years and he was previously scheduled for sleep study but could not afford it at the time.  He has a high deductible.  He cannot afford it now because he has met the deductible.  We will arrange for him to have this done because of the history of some snoring noticed by his girlfriend and also especially because of daytime fatigue.  He has no energy during the day.  He refuses to have a rectal exam but we will get a PSA test.  He will also get a flu shot today.    Patient Active Problem List   Diagnosis Date Noted  . Dyslipidemia 12/25/2017  . Morbid obesity ([place]) 12/25/2017  . Myalgia 12/25/2017  . Unstable angina ([place]) 08/18/2017  . Family history  of heart disease 10/19/2015  . Vitamin D deficiency 03/24/2015  . Metabolic syndrome 12/75/1700  . Hyperlipidemia, mixed 02/09/2015  . Obesity (BMI 30-39.9) 09/20/2010  . TOBACCO ABUSE 09/20/2010  . Hypertensive heart disease 09/20/2010  . CAD (coronary artery disease), native coronary artery 09/20/2010  . GERD 09/20/2010  . Nephrolithiasis 09/20/2010   Outpatient Encounter Medications as of 05/28/2018  Medication Sig  . acetaminophen (TYLENOL) 325 MG tablet Take 650 mg by mouth every 6 (six) hours as needed for moderate pain or headache.   Marland Kitchen aspirin 81 MG tablet Take 1 tablet (81 mg total) by mouth daily. (Patient taking differently: Take 81 mg by mouth every evening. )  . Calcium Carbonate-Vitamin D (CALCIUM-VITAMIN D) 500-200 MG-UNIT per tablet Take 1 tablet by mouth every evening.   . cholecalciferol (VITAMIN D) 1000 units tablet Take 1,000 Units by mouth every evening.   . fluticasone (FLONASE) 50 MCG/ACT nasal spray Place 2 sprays into both nostrils daily.  Marland Kitchen lisinopril (PRINIVIL,ZESTRIL) 5 MG tablet Take 1 tablet (5 mg total) by mouth daily. Please schedule an appointment for refills  . metoprolol succinate (TOPROL-XL) 50 MG 24 hr tablet TAKE 1 TABLET (50 MG TOTAL) BY MOUTH DAILY IN THE EVENING. TAKE WITH OR IMMEDIATELY FOLLOWING A MEAL.  . nitroGLYCERIN (NITROSTAT) 0.4 MG SL tablet  Place 1 tablet (0.4 mg total) under the tongue every 5 (five) minutes as needed. (Patient taking differently: Place 0.4 mg under the tongue every 5 (five) minutes as needed for chest pain. )  . Omega-3 Fatty Acids (FISH OIL) 1000 MG CAPS Take 2 capsules (2,000 mg total) by mouth daily. (Patient taking differently: Take 2 capsules by mouth every evening. )  . omeprazole (PRILOSEC) 40 MG capsule TAKE 1 CAPSULE BY MOUTH ONCE DAILY  . prasugrel (EFFIENT) 10 MG TABS tablet TAKE 1 TABLET (10 MG TOTAL) BY MOUTH DAILY.  . rosuvastatin (CRESTOR) 40 MG tablet Take 1 tablet (40 mg total) by mouth daily.  .  valACYclovir (VALTREX) 500 MG tablet TAKE 1 TABLET BY MOUTH EVERY DAY   No facility-administered encounter medications on file as of 05/28/2018.      Review of Systems  Constitutional: Positive for fatigue (not sleeping well ).  HENT: Negative.   Eyes: Negative.   Respiratory: Negative.   Cardiovascular: Negative.   Gastrointestinal: Negative.   Endocrine: Negative.   Genitourinary: Negative.   Musculoskeletal: Negative.   Skin: Negative.   Allergic/Immunologic: Negative.   Neurological: Negative.   Hematological: Negative.   Psychiatric/Behavioral: Negative.        Objective:   Physical Exam  Constitutional: He is oriented to person, place, and time. He appears well-developed and well-nourished. No distress.  The patient is alert and is following up regularly with his specialists specifically the cardiologist.  HENT:  Head: Normocephalic and atraumatic.  Right Ear: External ear normal.  Left Ear: External ear normal.  Nose: Nose normal.  Mouth/Throat: Oropharynx is clear and moist. No oropharyngeal exudate.  Eyes: Pupils are equal, round, and reactive to light. Conjunctivae and EOM are normal. Right eye exhibits no discharge. Left eye exhibits no discharge. No scleral icterus.  Patient is in need of an eye exam.  He says he will arrange this.  Neck: Normal range of motion. Neck supple. No thyromegaly present.  No bruits thyromegaly or anterior cervical adenopathy  Cardiovascular: Normal rate, regular rhythm, normal heart sounds and intact distal pulses. Exam reveals no gallop.  No murmur heard. The heart is regular at 72/min with out murmurs are gallops.  There were good pedal pulses palpable in both extremities.  Pulmonary/Chest: Effort normal and breath sounds normal. He has no wheezes. He has no rales. He exhibits no tenderness.  Clear anteriorly and posteriorly and no axillary adenopathy  Abdominal: Soft. Bowel sounds are normal. He exhibits no mass. There is no  tenderness.  Midline incision with large grapefruit size incisional hernia.  No tenderness.  No liver or spleen enlargement or epigastric tenderness no suprapubic tenderness.  No masses.  Genitourinary:  Genitourinary Comments: Requested not to have this exam done.  Musculoskeletal: Normal range of motion. He exhibits no edema, tenderness or deformity.  Lymphadenopathy:    He has no cervical adenopathy.  Neurological: He is alert and oriented to person, place, and time. He has normal reflexes. No cranial nerve deficit.  1+ and equal bilaterally lower extremities  Skin: Skin is warm and dry. No rash noted.  Psychiatric: He has a normal mood and affect. His behavior is normal. Judgment and thought content normal.  The patient's mood affect and behavior were all normal for him.  Nursing note and vitals reviewed.  BP 124/76 (BP Location: Left Arm)   Pulse 63   Temp (!) 97 F (36.1 C) (Oral)   Ht '5\' 10"'  (1.778 m)   Wt 270 lb (  122.5 kg)   BMI 38.74 kg/m         Assessment & Plan:  1. Hyperlipidemia, mixed -Continue current treatment pending results of lab work - CBC with Differential/Platelet - Lipid panel  2. Essential hypertension -Blood pressure is good today and he will continue with current treatment - CBC with Differential/Platelet - BMP8+EGFR - Hepatic function panel  3. Vitamin D deficiency -Continue with vitamin D replacement pending results of lab work - CBC with Differential/Platelet - VITAMIN D 25 Hydroxy (Vit-D Deficiency, Fractures)  4. Family history of heart disease -Patient also has coronary artery disease and he will continue to follow-up with his cardiologist as planned since he has had stents put in early this year. - CBC with Differential/Platelet - Lipid panel  5. Atherosclerosis of native coronary artery of native heart without angina pectoris -Follow-up with cardiology as planned - CBC with Differential/Platelet - Lipid panel  6. Annual physical  exam -Patient will consider Cologuard. -Patient refuses rectal exam - CBC with Differential/Platelet - BMP8+EGFR - Lipid panel - VITAMIN D 25 Hydroxy (Vit-D Deficiency, Fractures) - Hepatic function panel - PSA, total and free - Urinalysis, Complete  7. Fatigue, unspecified type - Ambulatory referral to Pulmonology - Vitamin B12 - Thyroid Panel With TSH  8. Sleep difficulties - Ambulatory referral to Pulmonology  9. Snoring - Ambulatory referral to Pulmonology  No orders of the defined types were placed in this encounter.  Patient Instructions  Continue current medications. Continue good therapeutic lifestyle changes which include good diet and exercise. Fall precautions discussed with patient. If an FOBT was given today- please return it to our front desk. If you are over 41 years old - you may need Prevnar 69 or the adult Pneumonia vaccine.  **Flu shots are available--- please call and schedule a FLU-CLINIC appointment**  After your visit with [place] today you will receive a survey in the mail or online from [employer] regarding your care with [place]. Please take a moment to fill this out. Your feedback is very important to [place] as you can help [place] better understand your patient needs as well as improve your experience and satisfaction. WE CARE ABOUT YOU!!!   Do not forget to get your eye exam done We will refer you for sleep studies because of the symptoms you have been having Consider doing the Cologuard for checking your colon for colon cancer Continue to drink plenty of fluids and stay well-hydrated Avoid the use of the overhead fan The flu shot that you received today may make your arm sore Always drink plenty of fluids and stay well-hydrated  Arrie Senate MD

## 2018-05-28 NOTE — Addendum Note (Signed)
Addended by: Magdalene River on: 05/28/2018 05:03 PM   Modules accepted: Orders

## 2018-05-28 NOTE — Patient Instructions (Addendum)
Continue current medications. Continue good therapeutic lifestyle changes which include good diet and exercise. Fall precautions discussed with patient. If an FOBT was given today- please return it to our front desk. If you are over 61 years old - you may need Prevnar 13 or the adult Pneumonia vaccine.  **Flu shots are available--- please call and schedule a FLU-CLINIC appointment**  After your visit with [place] today you will receive a survey in the mail or online from [employer] regarding your care with [place]. Please take a moment to fill this out. Your feedback is very important to [place] as you can help [place] better understand your patient needs as well as improve your experience and satisfaction. WE CARE ABOUT YOU!!!   Do not forget to get your eye exam done We will refer you for sleep studies because of the symptoms you have been having Consider doing the Cologuard for checking your colon for colon cancer Continue to drink plenty of fluids and stay well-hydrated Avoid the use of the overhead fan The flu shot that you received today may make your arm sore Always drink plenty of fluids and stay well-hydrated

## 2018-05-29 LAB — BMP8+EGFR
BUN/Creatinine Ratio: 32 — ABNORMAL HIGH (ref 10–24)
BUN: 23 mg/dL (ref 8–27)
CALCIUM: 9.3 mg/dL (ref 8.6–10.2)
CO2: 21 mmol/L (ref 20–29)
CREATININE: 0.72 mg/dL — AB (ref 0.76–1.27)
Chloride: 104 mmol/L (ref 96–106)
GFR calc Af Amer: 116 mL/min/{1.73_m2} (ref >59)
GFR calc non Af Amer: 101 mL/min/{1.73_m2} (ref >59)
Glucose: 111 mg/dL — ABNORMAL HIGH (ref 65–99)
Potassium: 4.6 mmol/L (ref 3.5–5.2)
Sodium: 140 mmol/L (ref 134–144)

## 2018-05-29 LAB — LIPID PANEL
CHOLESTEROL TOTAL: 124 mg/dL (ref 100–199)
Chol/HDL Ratio: 3.3 ratio (ref 0.0–5.0)
HDL: 38 mg/dL — AB (ref >39)
LDL CALC: 53 mg/dL (ref 0–99)
TRIGLYCERIDES: 166 mg/dL — AB (ref 0–149)
VLDL CHOLESTEROL CAL: 33 mg/dL (ref 5–40)

## 2018-05-29 LAB — CBC WITH DIFFERENTIAL/PLATELET
Basophils Absolute: 0.1 10*3/uL (ref 0.0–0.2)
Basos: 1 %
EOS (ABSOLUTE): 0.6 10*3/uL — ABNORMAL HIGH (ref 0.0–0.4)
Eos: 6 %
HEMOGLOBIN: 16.9 g/dL (ref 13.0–17.7)
Hematocrit: 49.8 % (ref 37.5–51.0)
IMMATURE GRANS (ABS): 0.1 10*3/uL (ref 0.0–0.1)
IMMATURE GRANULOCYTES: 1 %
LYMPHS: 53 %
Lymphocytes Absolute: 5.2 10*3/uL — ABNORMAL HIGH (ref 0.7–3.1)
MCH: 31.5 pg (ref 26.6–33.0)
MCHC: 33.9 g/dL (ref 31.5–35.7)
MCV: 93 fL (ref 79–97)
MONOCYTES: 12 %
Monocytes Absolute: 1.2 10*3/uL — ABNORMAL HIGH (ref 0.1–0.9)
NEUTROS ABS: 2.5 10*3/uL (ref 1.4–7.0)
NEUTROS PCT: 27 %
Platelets: 277 10*3/uL (ref 150–450)
RBC: 5.36 x10E6/uL (ref 4.14–5.80)
RDW: 14.1 % (ref 12.3–15.4)
WBC: 9.6 10*3/uL (ref 3.4–10.8)

## 2018-05-29 LAB — HEPATIC FUNCTION PANEL
ALK PHOS: 140 IU/L — AB (ref 39–117)
ALT: 27 IU/L (ref 0–44)
AST: 32 IU/L (ref 0–40)
Albumin: 4.3 g/dL (ref 3.6–4.8)
BILIRUBIN, DIRECT: 0.12 mg/dL (ref 0.00–0.40)
Bilirubin Total: 0.4 mg/dL (ref 0.0–1.2)
TOTAL PROTEIN: 6.9 g/dL (ref 6.0–8.5)

## 2018-05-29 LAB — THYROID PANEL WITH TSH
Free Thyroxine Index: 2 (ref 1.2–4.9)
T3 UPTAKE RATIO: 28 % (ref 24–39)
T4, Total: 7.1 ug/dL (ref 4.5–12.0)
TSH: 0.716 u[IU]/mL (ref 0.450–4.500)

## 2018-05-29 LAB — PSA, TOTAL AND FREE
PSA FREE PCT: 13.3 %
PSA FREE: 0.16 ng/mL
Prostate Specific Ag, Serum: 1.2 ng/mL (ref 0.0–4.0)

## 2018-05-29 LAB — VITAMIN D 25 HYDROXY (VIT D DEFICIENCY, FRACTURES): Vit D, 25-Hydroxy: 32.8 ng/mL (ref 30.0–100.0)

## 2018-05-29 LAB — VITAMIN B12: Vitamin B-12: 276 pg/mL (ref 232–1245)

## 2018-05-30 ENCOUNTER — Telehealth: Payer: Self-pay | Admitting: *Deleted

## 2018-05-30 LAB — URINE CULTURE

## 2018-05-30 NOTE — Telephone Encounter (Signed)
Pt notified of results Verbalizes understanding 

## 2018-06-02 ENCOUNTER — Other Ambulatory Visit: Payer: Self-pay | Admitting: *Deleted

## 2018-06-02 MED ORDER — ICOSAPENT ETHYL 1 G PO CAPS: 2.0000 | ORAL_CAPSULE | Freq: Two times a day (BID) | ORAL | 3 refills | Status: DC

## 2018-06-03 ENCOUNTER — Telehealth: Payer: Self-pay | Admitting: Family Medicine

## 2018-06-03 DIAGNOSIS — Z1211 Encounter for screening for malignant neoplasm of colon: Secondary | ICD-10-CM

## 2018-06-03 NOTE — Telephone Encounter (Signed)
Pt aware we will order cologaurd.

## 2018-06-13 ENCOUNTER — Ambulatory Visit: Payer: 59 | Admitting: Family Medicine

## 2018-06-16 ENCOUNTER — Ambulatory Visit: Payer: 59 | Admitting: Family Medicine

## 2018-06-30 ENCOUNTER — Encounter: Payer: Self-pay | Admitting: Pulmonary Disease

## 2018-06-30 ENCOUNTER — Ambulatory Visit (INDEPENDENT_AMBULATORY_CARE_PROVIDER_SITE_OTHER): Payer: 59 | Admitting: Pulmonary Disease

## 2018-06-30 VITALS — BP 134/80 | HR 72 | Ht 70.0 in | Wt 274.2 lb

## 2018-06-30 DIAGNOSIS — Z87898 Personal history of other specified conditions: Secondary | ICD-10-CM | POA: Diagnosis not present

## 2018-06-30 DIAGNOSIS — G4719 Other hypersomnia: Secondary | ICD-10-CM | POA: Diagnosis not present

## 2018-06-30 DIAGNOSIS — G4733 Obstructive sleep apnea (adult) (pediatric): Secondary | ICD-10-CM

## 2018-06-30 NOTE — Addendum Note (Signed)
Addended by: Etheleen MayhewOX, HEATHER C on: 06/30/2018 11:01 AM   Modules accepted: Orders
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
## 2018-06-30 NOTE — Patient Instructions (Signed)
History of snoring,  Shiftwork sleep disorder  Excessive daytime sleepiness  High probability of significant sleep disordered breathing  We will order a home sleep study, Treatment options discussed will include CPAP therapy  I will see you back in the office in about 3 months  Call with significant concerns Sleep Apnea Sleep apnea is a condition in which breathing pauses or becomes shallow during sleep. Episodes of sleep apnea usually last 10 seconds or longer, and they may occur as many as 20 times an hour. Sleep apnea disrupts your sleep and keeps your body from getting the rest that it needs. This condition can increase your risk of certain health problems, including:  Heart attack.  Stroke.  Obesity.  Diabetes.  Heart failure.  Irregular heartbeat.  There are three kinds of sleep apnea:  Obstructive sleep apnea. This kind is caused by a blocked or collapsed airway.  Central sleep apnea. This kind happens when the part of the brain that controls breathing does not send the correct signals to the muscles that control breathing.  Mixed sleep apnea. This is a combination of obstructive and central sleep apnea.  What are the causes? The most common cause of this condition is a collapsed or blocked airway. An airway can collapse or become blocked if:  Your throat muscles are abnormally relaxed.  Your tongue and tonsils are larger than normal.  You are overweight.  Your airway is smaller than normal.  What increases the risk? This condition is more likely to develop in people who:  Are overweight.  Smoke.  Have a smaller than normal airway.  Are elderly.  Are male.  Drink alcohol.  Take sedatives or tranquilizers.  Have a family history of sleep apnea.  What are the signs or symptoms? Symptoms of this condition include:  Trouble staying asleep.  Daytime sleepiness and tiredness.  Irritability.  Loud snoring.  Morning headaches.  Trouble  concentrating.  Forgetfulness.  Decreased interest in sex.  Unexplained sleepiness.  Mood swings.  Personality changes.  Feelings of depression.  Waking up often during the night to urinate.  Dry mouth.  Sore throat.  How is this diagnosed? This condition may be diagnosed with:  A medical history.  A physical exam.  A series of tests that are done while you are sleeping (sleep study). These tests are usually done in a sleep lab, but they may also be done at home.  How is this treated? Treatment for this condition aims to restore normal breathing and to ease symptoms during sleep. It may involve managing health issues that can affect breathing, such as high blood pressure or obesity. Treatment may include:  Sleeping on your side.  Using a decongestant if you have nasal congestion.  Avoiding the use of depressants, including alcohol, sedatives, and narcotics.  Losing weight if you are overweight.  Making changes to your diet.  Quitting smoking.  Using a device to open your airway while you sleep, such as: ? An oral appliance. This is a custom-made mouthpiece that shifts your lower jaw forward. ? A continuous positive airway pressure (CPAP) device. This device delivers oxygen to your airway through a mask. ? A nasal expiratory positive airway pressure (EPAP) device. This device has valves that you put into each nostril. ? A bi-level positive airway pressure (BPAP) device. This device delivers oxygen to your airway through a mask.  Surgery if other treatments do not work. During surgery, excess tissue is removed to create a wider airway.  It  is important to get treatment for sleep apnea. Without treatment, this condition can lead to:  High blood pressure.  Coronary artery disease.  (Men) An inability to achieve or maintain an erection (impotence).  Reduced thinking abilities.  Follow these instructions at home:  Make any lifestyle changes that your health  care provider recommends.  Eat a healthy, well-balanced diet.  Take over-the-counter and prescription medicines only as told by your health care provider.  Avoid using depressants, including alcohol, sedatives, and narcotics.  Take steps to lose weight if you are overweight.  If you were given a device to open your airway while you sleep, use it only as told by your health care provider.  Do not use any tobacco products, such as cigarettes, chewing tobacco, and e-cigarettes. If you need help quitting, ask your health care provider.  Keep all follow-up visits as told by your health care provider. This is important. Contact a health care provider if:  The device that you received to open your airway during sleep is uncomfortable or does not seem to be working.  Your symptoms do not improve.  Your symptoms get worse. Get help right away if:  You develop chest pain.  You develop shortness of breath.  You develop discomfort in your back, arms, or stomach.  You have trouble speaking.  You have weakness on one side of your body.  You have drooping in your face. These symptoms may represent a serious problem that is an emergency. Do not wait to see if the symptoms will go away. Get medical help right away. Call your local emergency services (911 in the [place] Do not drive yourself to the hospital. This information is not intended to replace advice given to you by your health care provider. Make sure you discuss any questions you have with your health care provider. Document Released: 07/20/2002 Document Revised: 03/25/2016 Document Reviewed: 05/09/2015 Elsevier Interactive Patient Education  [employer].
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
## 2018-07-02 ENCOUNTER — Ambulatory Visit (INDEPENDENT_AMBULATORY_CARE_PROVIDER_SITE_OTHER): Payer: 59 | Admitting: Cardiology

## 2018-07-02 ENCOUNTER — Encounter: Payer: Self-pay | Admitting: Cardiology

## 2018-07-02 VITALS — BP 129/81 | HR 82 | Ht 70.0 in | Wt 271.6 lb

## 2018-07-02 DIAGNOSIS — I152 Hypertension secondary to endocrine disorders: Secondary | ICD-10-CM | POA: Insufficient documentation

## 2018-07-02 DIAGNOSIS — I10 Essential (primary) hypertension: Secondary | ICD-10-CM

## 2018-07-02 DIAGNOSIS — E785 Hyperlipidemia, unspecified: Secondary | ICD-10-CM

## 2018-07-02 DIAGNOSIS — I2510 Atherosclerotic heart disease of native coronary artery without angina pectoris: Secondary | ICD-10-CM | POA: Diagnosis not present

## 2018-07-02 DIAGNOSIS — E1159 Type 2 diabetes mellitus with other circulatory complications: Secondary | ICD-10-CM | POA: Insufficient documentation

## 2018-07-02 NOTE — Patient Instructions (Addendum)
Medication Instructions:  The current medical regimen is effective;  continue present plan and medications.  If you need a refill on your cardiac medications before your next appointment, please call your pharmacy.   Follow-Up: Follow up in 6 months with Dr. Hochrein.  You will receive a letter in the mail 2 months before you are due.  Please call us when you receive this letter to schedule your follow up appointment.  Thank you for choosing [facility]!!      

## 2018-07-03 ENCOUNTER — Other Ambulatory Visit: Payer: Self-pay | Admitting: Family Medicine

## 2018-07-14 ENCOUNTER — Other Ambulatory Visit: Payer: Self-pay | Admitting: Cardiology

## 2018-07-18 DIAGNOSIS — G4733 Obstructive sleep apnea (adult) (pediatric): Secondary | ICD-10-CM | POA: Diagnosis not present

## 2018-07-21 DIAGNOSIS — G4733 Obstructive sleep apnea (adult) (pediatric): Secondary | ICD-10-CM | POA: Diagnosis not present

## 2018-07-22 ENCOUNTER — Other Ambulatory Visit: Payer: Self-pay | Admitting: *Deleted

## 2018-07-22 DIAGNOSIS — G4733 Obstructive sleep apnea (adult) (pediatric): Secondary | ICD-10-CM

## 2018-07-23 ENCOUNTER — Telehealth: Payer: Self-pay | Admitting: Pulmonary Disease

## 2018-07-23 DIAGNOSIS — G4733 Obstructive sleep apnea (adult) (pediatric): Secondary | ICD-10-CM

## 2018-07-23 NOTE — Telephone Encounter (Signed)
Pt is calling back [phone]

## 2018-07-23 NOTE — Telephone Encounter (Signed)
Called and spoke with patient he is aware and verbalized understanding. Order sent in and appointment made. Nothing further needed.

## 2018-07-23 NOTE — Telephone Encounter (Signed)
Dr. Wynona Neatlalere has reviewed the home sleep test this showed Mild obstructive sleep apnea.   Recommendations   Treatment options are CPAP with the settings auto 5 to 15.This is recommended due to the non-restorative sleep and daytime sleepiness.      Weight loss measures .   Advise against driving while sleepy & against medication with sedative side effects.    Make appointment for 3 months for compliance with download with Dr. Wynona Neatlalere.  Atc the patient left voicemail to call back.

## 2018-07-28 DIAGNOSIS — K529 Noninfective gastroenteritis and colitis, unspecified: Secondary | ICD-10-CM | POA: Diagnosis not present

## 2018-07-31 DIAGNOSIS — G4733 Obstructive sleep apnea (adult) (pediatric): Secondary | ICD-10-CM | POA: Diagnosis not present

## 2018-08-30 ENCOUNTER — Other Ambulatory Visit: Payer: Self-pay | Admitting: Family Medicine

## 2018-09-25 ENCOUNTER — Other Ambulatory Visit: Payer: Self-pay | Admitting: Family Medicine

## 2018-10-01 ENCOUNTER — Other Ambulatory Visit: Payer: Self-pay | Admitting: Family Medicine

## 2018-10-08 ENCOUNTER — Ambulatory Visit: Payer: 59 | Admitting: Pulmonary Disease

## 2018-10-22 ENCOUNTER — Ambulatory Visit: Payer: 59 | Admitting: Pulmonary Disease

## 2018-11-28 ENCOUNTER — Ambulatory Visit: Payer: 59 | Admitting: Family Medicine

## 2018-12-22 ENCOUNTER — Other Ambulatory Visit: Payer: Self-pay | Admitting: Family Medicine

## 2018-12-24 MED ORDER — VALACYCLOVIR HCL 500 MG PO TABS: 500.0000 mg | ORAL_TABLET | Freq: Every day | ORAL | 0 refills | Status: DC

## 2018-12-24 NOTE — Addendum Note (Signed)
Addended by: Julious Payer D on: 12/24/2018 09:25 AM   Modules accepted: Orders

## 2018-12-24 NOTE — Telephone Encounter (Signed)
Refill failed, resent 

## 2018-12-27 ENCOUNTER — Other Ambulatory Visit: Payer: Self-pay | Admitting: Family Medicine

## 2018-12-29 ENCOUNTER — Telehealth: Payer: Self-pay | Admitting: Family Medicine

## 2018-12-29 ENCOUNTER — Other Ambulatory Visit: Payer: Self-pay

## 2018-12-29 MED ORDER — VALACYCLOVIR HCL 500 MG PO TABS: 500.0000 mg | ORAL_TABLET | Freq: Every day | ORAL | 0 refills | Status: DC

## 2018-12-29 MED ORDER — ROSUVASTATIN CALCIUM 40 MG PO TABS: 40.0000 mg | ORAL_TABLET | Freq: Every day | ORAL | 1 refills | Status: DC

## 2018-12-29 NOTE — Telephone Encounter (Signed)
Rx(s) sent to pharmacy electronically.  

## 2018-12-29 NOTE — Telephone Encounter (Signed)
Pt aware sent to pharmacy 

## 2019-01-06 ENCOUNTER — Telehealth: Payer: Self-pay | Admitting: *Deleted
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
## 2019-01-06 NOTE — Telephone Encounter (Signed)
YOUR CARDIOLOGY TEAM HAS ARRANGED FOR AN E-VISIT FOR YOUR APPOINTMENT - PLEASE REVIEW IMPORTANT INFORMATION BELOW SEVERAL DAYS PRIOR TO YOUR APPOINTMENT  Due to the recent COVID-19 pandemic, we are transitioning in-person office visits to tele-medicine visits in an effort to decrease unnecessary exposure to our patients, their families, and staff. These visits are billed to your insurance just like a normal visit is. We also encourage you to sign up for MyChart if you have not already done so. You will need a smartphone if possible. For patients that do not have this, we can still complete the visit using a regular telephone but do prefer a smartphone to enable video when possible. You may have a family member that lives with you that can help. If possible, we also ask that you have a blood pressure cuff and scale at home to measure your blood pressure, heart rate and weight prior to your scheduled appointment. Patients with clinical needs that need an in-person evaluation and testing will still be able to come to the office if absolutely necessary. If you have any questions, feel free to call our office.  Marland Kitchen. YOUR PROVIDER WILL BE USING THE FOLLOWING PLATFORM TO COMPLETE YOUR VISIT: Phone call   2-3 DAYS BEFORE YOUR APPOINTMENT  You will receive a telephone call from one of our [facility] team members - your caller ID may say "Unknown caller." If this is a video visit, we will walk you through how to get the video launched on your phone. We will remind you check your blood pressure, heart rate and weight prior to your scheduled appointment. If you have an Apple Watch or Kardia, please upload any pertinent ECG strips the day before or morning of your appointment to MyChart. Our staff will also make sure you have reviewed the consent and agree to move forward with your scheduled tele-health visit.    THE DAY OF YOUR APPOINTMENT  Approximately 15 minutes prior to your scheduled appointment, you will receive  a telephone call from one of [facility] team - your caller ID may say "Unknown caller."  Our staff will confirm medications, vital signs for the day and any symptoms you may be experiencing. Please have this information available prior to the time of visit start. It may also be helpful for you to have a pad of paper and pen handy for any instructions given during your visit. They will also walk you through joining the smartphone meeting if this is a video visit.  CONSENT FOR TELE-HEALTH VISIT - PLEASE REVIEW  I hereby voluntarily request, consent and authorize [facility] and its employed or contracted physicians, physician assistants, nurse practitioners or other licensed health care professionals (the Practitioner), to provide me with telemedicine health care services (the "Services") as deemed necessary by the treating Practitioner. I acknowledge and consent to receive the Services by the Practitioner via telemedicine. I understand that the telemedicine visit will involve communicating with the Practitioner through live audiovisual communication technology and the disclosure of certain medical information by electronic transmission. I acknowledge that I have been given the opportunity to request an in-person assessment or other available alternative prior to the telemedicine visit and am voluntarily participating in the telemedicine visit.  I understand that I have the right to withhold or withdraw my consent to the use of telemedicine in the course of my care at any time, without affecting my right to future care or treatment, and that the Practitioner or I may terminate the telemedicine visit at any  time. I understand that I have the right to inspect all information obtained and/or recorded in the course of the telemedicine visit and may receive copies of available information for a reasonable fee.  I understand that some of the potential risks of receiving the Services via telemedicine include:   Marland Kitchen Delay or interruption in medical evaluation due to technological equipment failure or disruption; . Information transmitted may not be sufficient (e.g. poor resolution of images) to allow for appropriate medical decision making by the Practitioner; and/or  . In rare instances, security protocols could fail, causing a breach of personal health information.  Furthermore, I acknowledge that it is my responsibility to provide information about my medical history, conditions and care that is complete and accurate to the best of my ability. I acknowledge that Practitioner's advice, recommendations, and/or decision may be based on factors not within their control, such as incomplete or inaccurate data provided by me or distortions of diagnostic images or specimens that may result from electronic transmissions. I understand that the practice of medicine is not an exact science and that Practitioner makes no warranties or guarantees regarding treatment outcomes. I acknowledge that I will receive a copy of this consent concurrently upon execution via email to the email address I last provided but may also request a printed copy by calling the office of [facility].    I understand that my insurance will be billed for this visit.   I have read or had this consent read to me. . I understand the contents of this consent, which adequately explains the benefits and risks of the Services being provided via telemedicine.  . I have been provided ample opportunity to ask questions regarding this consent and the Services and have had my questions answered to my satisfaction. . I give my informed consent for the services to be provided through the use of telemedicine in my medical care  By participating in this telemedicine visit I agree to the above. Obtained verbal consent from pt on 5/19 when appt was changed.

## 2019-01-07 ENCOUNTER — Telehealth (INDEPENDENT_AMBULATORY_CARE_PROVIDER_SITE_OTHER): Payer: 59 | Admitting: Cardiology

## 2019-01-07 ENCOUNTER — Encounter: Payer: Self-pay | Admitting: Cardiology

## 2019-01-07 VITALS — BP 115/69 | HR 65 | Ht 70.0 in | Wt 266.0 lb

## 2019-01-07 DIAGNOSIS — I2510 Atherosclerotic heart disease of native coronary artery without angina pectoris: Secondary | ICD-10-CM | POA: Diagnosis not present

## 2019-01-07 DIAGNOSIS — Z7189 Other specified counseling: Secondary | ICD-10-CM

## 2019-01-07 DIAGNOSIS — I10 Essential (primary) hypertension: Secondary | ICD-10-CM

## 2019-01-07 DIAGNOSIS — E785 Hyperlipidemia, unspecified: Secondary | ICD-10-CM

## 2019-01-07 NOTE — Patient Instructions (Signed)
Medication Instructions:  The current medical regimen is effective;  continue present plan and medications.  If you need a refill on your cardiac medications before your next appointment, please call your pharmacy.   Follow-Up: Follow up in 1 year with Dr. Hochrein in [place].  You will receive a letter in the mail 2 months before you are due.  Please call us when you receive this letter to schedule your follow up appointment.  Thank you for choosing  [facility]!!     

## 2019-01-09 ENCOUNTER — Other Ambulatory Visit: Payer: Self-pay | Admitting: Family Medicine

## 2019-01-18 ENCOUNTER — Other Ambulatory Visit: Payer: Self-pay | Admitting: Family Medicine

## 2019-03-04 ENCOUNTER — Ambulatory Visit: Payer: 59 | Admitting: Family Medicine

## 2019-03-05 ENCOUNTER — Ambulatory Visit: Payer: 59 | Admitting: Family Medicine

## 2019-03-09 ENCOUNTER — Other Ambulatory Visit: Payer: Self-pay

## 2019-03-09 ENCOUNTER — Ambulatory Visit (INDEPENDENT_AMBULATORY_CARE_PROVIDER_SITE_OTHER): Payer: 59 | Admitting: Family Medicine

## 2019-03-09 ENCOUNTER — Encounter: Payer: Self-pay | Admitting: Family Medicine

## 2019-03-09 VITALS — BP 119/78 | HR 65 | Temp 96.6°F | Ht 70.0 in | Wt 270.0 lb

## 2019-03-09 DIAGNOSIS — I10 Essential (primary) hypertension: Secondary | ICD-10-CM

## 2019-03-09 DIAGNOSIS — Z8249 Family history of ischemic heart disease and other diseases of the circulatory system: Secondary | ICD-10-CM

## 2019-03-09 DIAGNOSIS — Z1159 Encounter for screening for other viral diseases: Secondary | ICD-10-CM

## 2019-03-09 DIAGNOSIS — Z125 Encounter for screening for malignant neoplasm of prostate: Secondary | ICD-10-CM

## 2019-03-09 DIAGNOSIS — N481 Balanitis: Secondary | ICD-10-CM

## 2019-03-09 DIAGNOSIS — E782 Mixed hyperlipidemia: Secondary | ICD-10-CM

## 2019-03-09 DIAGNOSIS — E559 Vitamin D deficiency, unspecified: Secondary | ICD-10-CM

## 2019-03-09 DIAGNOSIS — I2510 Atherosclerotic heart disease of native coronary artery without angina pectoris: Secondary | ICD-10-CM

## 2019-03-09 DIAGNOSIS — Z114 Encounter for screening for human immunodeficiency virus [HIV]: Secondary | ICD-10-CM

## 2019-03-09 MED ORDER — CLOTRIMAZOLE-BETAMETHASONE 1-0.05 % EX CREA: 1.0000 "application " | TOPICAL_CREAM | Freq: Two times a day (BID) | CUTANEOUS | 0 refills | Status: DC
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
## 2019-03-09 NOTE — Patient Instructions (Signed)
It was a pleasure seeing you today, [name].  Information regarding what we discussed is included in this packet.  Please make an appointment to see me in 3 months.   In a few days you may receive a survey in the mail or online from [employer] regarding your visit with [place] today. Please take a moment to fill this out. Your feedback is very important to our office. It can help [place] better understand your needs as well as improve your experience and satisfaction. Thank you for taking your time to complete it. We care about you.  Because of recent events of COVID-19 ("Coronavirus"), please follow CDC recommendations:   1. Wash your hand frequently 2. Avoid touching your face 3. Stay away from people who are sick 4. If you have symptoms such as fever, cough, shortness of breath then call your healthcare provider for further guidance 5. If you are sick, STAY AT HOME, unless otherwise directed by your healthcare provider. 6. Follow directions from state and national officials regarding staying safe    Please feel free to call our office if any questions or concerns arise.  Warm Regards, Monia Pouch, FNP-C [facility] [address] [place], [place] [address] [phone]

## 2019-03-10 LAB — LIPID PANEL
Chol/HDL Ratio: 3.1 ratio (ref 0.0–5.0)
Cholesterol, Total: 133 mg/dL (ref 100–199)
HDL: 43 mg/dL (ref >39)
LDL Calculated: 56 mg/dL (ref 0–99)
Triglycerides: 168 mg/dL — ABNORMAL HIGH (ref 0–149)
VLDL Cholesterol Cal: 34 mg/dL (ref 5–40)

## 2019-03-10 LAB — CMP14+EGFR
ALT: 32 IU/L (ref 0–44)
AST: 33 IU/L (ref 0–40)
Albumin/Globulin Ratio: 1.6 (ref 1.2–2.2)
Albumin: 4.2 g/dL (ref 3.8–4.8)
Alkaline Phosphatase: 154 IU/L — ABNORMAL HIGH (ref 39–117)
BUN/Creatinine Ratio: 22 (ref 10–24)
BUN: 17 mg/dL (ref 8–27)
Bilirubin Total: 0.4 mg/dL (ref 0.0–1.2)
CO2: 23 mmol/L (ref 20–29)
Calcium: 9.3 mg/dL (ref 8.6–10.2)
Chloride: 102 mmol/L (ref 96–106)
Creatinine, Ser: 0.77 mg/dL (ref 0.76–1.27)
GFR calc Af Amer: 112 mL/min/{1.73_m2} (ref >59)
GFR calc non Af Amer: 97 mL/min/{1.73_m2} (ref >59)
Globulin, Total: 2.6 g/dL (ref 1.5–4.5)
Glucose: 204 mg/dL — ABNORMAL HIGH (ref 65–99)
Potassium: 4.5 mmol/L (ref 3.5–5.2)
Sodium: 140 mmol/L (ref 134–144)
Total Protein: 6.8 g/dL (ref 6.0–8.5)

## 2019-03-10 LAB — CBC WITH DIFFERENTIAL/PLATELET
Basophils Absolute: 0.1 10*3/uL (ref 0.0–0.2)
Basos: 1 %
EOS (ABSOLUTE): 0.2 10*3/uL (ref 0.0–0.4)
Eos: 2 %
Hematocrit: 51.3 % — ABNORMAL HIGH (ref 37.5–51.0)
Hemoglobin: 17.4 g/dL (ref 13.0–17.7)
Immature Grans (Abs): 0.1 10*3/uL (ref 0.0–0.1)
Immature Granulocytes: 1 %
Lymphocytes Absolute: 4.5 10*3/uL — ABNORMAL HIGH (ref 0.7–3.1)
Lymphs: 48 %
MCH: 32.4 pg (ref 26.6–33.0)
MCHC: 33.9 g/dL (ref 31.5–35.7)
MCV: 96 fL (ref 79–97)
Monocytes Absolute: 1.1 10*3/uL — ABNORMAL HIGH (ref 0.1–0.9)
Monocytes: 12 %
Neutrophils Absolute: 3.3 10*3/uL (ref 1.4–7.0)
Neutrophils: 36 %
Platelets: 221 10*3/uL (ref 150–450)
RBC: 5.37 x10E6/uL (ref 4.14–5.80)
RDW: 13.1 % (ref 11.6–15.4)
WBC: 9.3 10*3/uL (ref 3.4–10.8)

## 2019-03-10 LAB — PSA, TOTAL AND FREE
PSA, Free Pct: 16.3 %
PSA, Free: 0.13 ng/mL
Prostate Specific Ag, Serum: 0.8 ng/mL (ref 0.0–4.0)

## 2019-03-10 LAB — HIV ANTIBODY (ROUTINE TESTING W REFLEX): HIV Screen 4th Generation wRfx: NONREACTIVE

## 2019-03-10 LAB — THYROID PANEL WITH TSH
Free Thyroxine Index: 1.8 (ref 1.2–4.9)
T3 Uptake Ratio: 25 % (ref 24–39)
T4, Total: 7.2 ug/dL (ref 4.5–12.0)
TSH: 0.824 u[IU]/mL (ref 0.450–4.500)

## 2019-03-10 LAB — HEPATITIS C ANTIBODY: Hep C Virus Ab: <0.1 s/co ratio (ref 0.0–0.9)

## 2019-03-10 LAB — VITAMIN D 25 HYDROXY (VIT D DEFICIENCY, FRACTURES): Vit D, 25-Hydroxy: 29.4 ng/mL — ABNORMAL LOW (ref 30.0–100.0)

## 2019-03-11 LAB — HGB A1C W/O EAG: Hgb A1c MFr Bld: 8.6 % — ABNORMAL HIGH (ref 4.8–5.6)

## 2019-03-11 LAB — SPECIMEN STATUS REPORT

## 2019-03-17 ENCOUNTER — Other Ambulatory Visit: Payer: Self-pay

## 2019-03-18 ENCOUNTER — Encounter: Payer: Self-pay | Admitting: Family Medicine

## 2019-03-18 ENCOUNTER — Ambulatory Visit (INDEPENDENT_AMBULATORY_CARE_PROVIDER_SITE_OTHER): Payer: 59 | Admitting: Family Medicine

## 2019-03-18 VITALS — BP 112/68 | HR 69 | Temp 96.7°F | Ht 70.0 in | Wt 270.0 lb

## 2019-03-18 DIAGNOSIS — R748 Abnormal levels of other serum enzymes: Secondary | ICD-10-CM | POA: Diagnosis not present

## 2019-03-18 DIAGNOSIS — R932 Abnormal findings on diagnostic imaging of liver and biliary tract: Secondary | ICD-10-CM

## 2019-03-18 DIAGNOSIS — E119 Type 2 diabetes mellitus without complications: Secondary | ICD-10-CM

## 2019-03-18 DIAGNOSIS — E1165 Type 2 diabetes mellitus with hyperglycemia: Secondary | ICD-10-CM | POA: Insufficient documentation

## 2019-03-18 MED ORDER — BLOOD GLUCOSE METER KIT: PACK | 0 refills | Status: DC

## 2019-03-18 MED ORDER — METFORMIN HCL 500 MG PO TABS: 500.0000 mg | ORAL_TABLET | Freq: Two times a day (BID) | ORAL | 3 refills | Status: DC
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
## 2019-03-18 NOTE — Patient Instructions (Signed)

## 2019-03-19 DIAGNOSIS — R748 Abnormal levels of other serum enzymes: Secondary | ICD-10-CM | POA: Insufficient documentation

## 2019-03-19 LAB — CMP14+EGFR
ALT: 30 IU/L (ref 0–44)
AST: 40 IU/L (ref 0–40)
Albumin/Globulin Ratio: 1.4 (ref 1.2–2.2)
Albumin: 3.8 g/dL (ref 3.8–4.8)
Alkaline Phosphatase: 146 IU/L — ABNORMAL HIGH (ref 39–117)
BUN/Creatinine Ratio: 24 (ref 10–24)
BUN: 17 mg/dL (ref 8–27)
Bilirubin Total: 0.3 mg/dL (ref 0.0–1.2)
CO2: 22 mmol/L (ref 20–29)
Calcium: 8.9 mg/dL (ref 8.6–10.2)
Chloride: 103 mmol/L (ref 96–106)
Creatinine, Ser: 0.72 mg/dL — ABNORMAL LOW (ref 0.76–1.27)
GFR calc Af Amer: 116 mL/min/{1.73_m2} (ref >59)
GFR calc non Af Amer: 100 mL/min/{1.73_m2} (ref >59)
Globulin, Total: 2.7 g/dL (ref 1.5–4.5)
Glucose: 178 mg/dL — ABNORMAL HIGH (ref 65–99)
Potassium: 4.5 mmol/L (ref 3.5–5.2)
Sodium: 140 mmol/L (ref 134–144)
Total Protein: 6.5 g/dL (ref 6.0–8.5)

## 2019-03-19 LAB — GAMMA GT: GGT: 84 IU/L — ABNORMAL HIGH (ref 0–65)

## 2019-03-19 NOTE — Addendum Note (Signed)
Addended by: Baruch Gouty on: 03/19/2019 08:05 AM   Modules accepted: Orders

## 2019-03-19 NOTE — Addendum Note (Signed)
Addended by: Baruch Gouty on: 03/19/2019 09:52 AM   Modules accepted: Orders

## 2019-03-24 ENCOUNTER — Ambulatory Visit (HOSPITAL_COMMUNITY): Payer: 59

## 2019-03-26 ENCOUNTER — Other Ambulatory Visit: Payer: Self-pay

## 2019-03-26 ENCOUNTER — Ambulatory Visit (HOSPITAL_COMMUNITY)
Admission: RE | Admit: 2019-03-26 | Discharge: 2019-03-26 | Disposition: A | Discharge status: Home / Self Care | Payer: 59 | Source: Ambulatory Visit | Attending: Family Medicine | Admitting: Family Medicine

## 2019-03-26 DIAGNOSIS — R748 Abnormal levels of other serum enzymes: Secondary | ICD-10-CM | POA: Diagnosis not present

## 2019-03-26 NOTE — Addendum Note (Signed)
Addended by: Baruch Gouty on: 03/26/2019 11:22 AM   Modules accepted: Orders

## 2019-03-27 ENCOUNTER — Encounter: Payer: Self-pay | Admitting: Gastroenterology

## 2019-03-31 ENCOUNTER — Telehealth: Payer: Self-pay | Admitting: Cardiology

## 2019-03-31 NOTE — Telephone Encounter (Signed)
Spoke with pt, aware of dr hochrein's comments.

## 2019-03-31 NOTE — Telephone Encounter (Signed)
My experience that CAD that has been treated usually doesn't qualify unless he is having unstable symptoms not manageable with meds and procedures or he is having frequent hospitalizations.

## 2019-03-31 NOTE — Telephone Encounter (Signed)
Left message for pt to call.

## 2019-03-31 NOTE — Telephone Encounter (Signed)
Spoke with pt, for the last several years he has had work restrictions from dr hochrein. His work has decided to cut back on employees and they told him to look into disability. Explained the patient starts the process with the insurance company and they will send paperwork to [place] to fill out. He wants to know what dr hochrein thinks about him getting disability. Will forward to dr hochrein to review and advise.

## 2019-03-31 NOTE — Telephone Encounter (Signed)
New Message   Patient would like to speak to a nurse about possibly starting disability claim.  Please call patient to advise

## 2019-04-05 ENCOUNTER — Other Ambulatory Visit: Payer: Self-pay | Admitting: Family Medicine

## 2019-04-06 ENCOUNTER — Other Ambulatory Visit: Payer: Self-pay | Admitting: Family Medicine

## 2019-04-12 ENCOUNTER — Other Ambulatory Visit: Payer: Self-pay | Admitting: Family Medicine

## 2019-04-15 ENCOUNTER — Encounter: Payer: 59 | Attending: Family Medicine | Admitting: Nutrition

## 2019-04-15 ENCOUNTER — Other Ambulatory Visit: Payer: Self-pay | Admitting: Cardiology

## 2019-04-15 ENCOUNTER — Encounter: Payer: Self-pay | Admitting: Nutrition

## 2019-04-15 ENCOUNTER — Other Ambulatory Visit: Payer: Self-pay | Admitting: Family Medicine

## 2019-04-15 ENCOUNTER — Other Ambulatory Visit: Payer: Self-pay

## 2019-04-15 ENCOUNTER — Other Ambulatory Visit: Payer: Self-pay | Admitting: Physician Assistant

## 2019-04-15 DIAGNOSIS — N481 Balanitis: Secondary | ICD-10-CM

## 2019-04-15 NOTE — Progress Notes (Signed)
Medical Nutrition Therapy:  Appt start time: 1100 end time:  1200.   Assessment:  Primary concerns today: Diabetes Type 2. Lives by himself with girlfriend. Girlfriend does most of the cooking and shopping. Has had 5 heart attacks in the past.  Eats 3  meals per day. Physical activity: walking.   [place] [employer]. Metformin 500 mg BID.  Drinks diet sodas and sweet tea. Willing to make changes with diet and exercise. Would benefit from seeing an endocrinologist for his Dm due to his past medical problems. FBS 138 mg/dl- 161170 mg/dl.  Nighttime at night.  Lab Results  Component Value Date   HGBA1C 8.6 (H) 03/09/2019   CMP Latest Ref Rng & Units 03/18/2019 03/09/2019 05/28/2018  Glucose 65 - 99 mg/dL 096(E178(H) 454(U204(H) 981(X111(H)  BUN 8 - 27 mg/dL 17 17 23   Creatinine 0.76 - 1.27 mg/dL 9.14(N0.72(L) 8.290.77 5.62(Z0.72(L)  Sodium 134 - 144 mmol/L 140 140 140  Potassium 3.5 - 5.2 mmol/L 4.5 4.5 4.6  Chloride 96 - 106 mmol/L 103 102 104  CO2 20 - 29 mmol/L 22 23 21   Calcium 8.6 - 10.2 mg/dL 8.9 9.3 9.3  Total Protein 6.0 - 8.5 g/dL 6.5 6.8 6.9  Total Bilirubin 0.0 - 1.2 mg/dL 0.3 0.4 0.4  Alkaline Phos 39 - 117 IU/L 146(H) 154(H) 140(H)  AST 0 - 40 IU/L 40 33 32  ALT 0 - 44 IU/L 30 32 27   Lipid Panel     Component Value Date/Time   CHOL 133 03/09/2019 0855   CHOL 114 01/19/2013 0819   TRIG 168 (H) 03/09/2019 0855   TRIG CANCELED 03/21/2016 1037   TRIG 196 (H) 01/19/2013 0819   HDL 43 03/09/2019 0855   HDL CANCELED 03/21/2016 1037   HDL 41 01/19/2013 0819   CHOLHDL 3.1 03/09/2019 0855   CHOLHDL 3.6 09/04/2010 0631   VLDL 68 (H) 09/04/2010 0631   LDLCALC 56 03/09/2019 0855   LDLCALC 34 01/19/2013 0819   LDLDIRECT 95 03/23/2015 0958     Preferred Learning Style:   No preference indicated   Learning Readiness:  Ready  Change in progress   MEDICATIONS:   DIETARY INTAKE:  24-hr recall  :Tomasa BlaseBacon, egg and cheese sandwich  B ( AM):  Snk ( AM):  L ( PM): pot roast, lima  beans, tomatoes,  Fried okra, sweet tea Snk ( PM):  D ( PM): lasagna, 4" , Diet Mt Dew Snk ( PM):  Beverages: diet soda or tea  Usual physical activity: walks some  Estimated energy needs: 1800  calories 200 g carbohydrates 135 g protein 50 g fat  Progress Towards Goal(s):  In progress.   Nutritional Diagnosis:  NB-1.1 Food and nutrition-related knowledge deficit As related to Diabetes.  As evidenced by A1C 9%.    Intervention:  Nutrition and Diabetes education provided on My Plate, CHO counting, meal planning, portion sizes, timing of meals, avoiding snacks between meals unless having a low blood sugar, target ranges for A1C and blood sugars, signs/symptoms and treatment of hyper/hypoglycemia, monitoring blood sugars, taking medications as prescribed, benefits of exercising 30 minutes per day and prevention of complications of DM.  Goals Follow My Plate Eat three meals a day Test blood sugars twice a day Walk 2 miles per day  No snacks Cut out diet sodas and tea and only drink water Get A1C to 7%  Lose 1-2 lbs per week Cut out processed foods. .  Teaching Method Utilized: Visual Auditory Hands on  Handouts given during visit include:  The Plate Method   Diabetes instructions  Hyperlipidemia   Barriers to learning/adherence to lifestyle change: none  Demonstrated degree of understanding via:  Teach Back   Monitoring/Evaluation:  Dietary intake, exercise, , and body weight in 1 month(s).

## 2019-04-15 NOTE — Patient Instructions (Signed)
Goals Follow My Plate Eat three meals a day Test blood sugars twice a day Walk 2 miles per day  No snacks Cut out diet sodas and tea and only drink water Get A1C to 7%  Lose 1-2 lbs per week Cut out processed foods. Marland Kitchen

## 2019-05-04 ENCOUNTER — Telehealth: Payer: Self-pay | Admitting: Cardiology

## 2019-05-04 NOTE — Telephone Encounter (Signed)
° °  [facility] Pre-operative Risk Assessment    Request for surgical clearance:  1. What type of surgery is being performed? Tooth extraction   2. When is this surgery scheduled? TBD  3. What type of clearance is required (medical clearance vs. Pharmacy clearance to hold med vs. Both)? both  4. Are there any medications that need to be held prior to surgery and how long? Blood thinner, as determined by the office  5. Practice name and name of physician performing surgery? Dr. Cicero Duck, [facility]  6. What is your office phone number: [phone]   7.   What is your office fax number: [phone]  8.   Anesthesia type (None, local, MAC, general) ? local   [name] 05/04/2019, 1:43 PM  _________________________________________________________________   (provider comments below)

## 2019-05-05 NOTE — Telephone Encounter (Signed)
I have called and confirmed only 1 tooth is being extracted

## 2019-05-05 NOTE — Telephone Encounter (Signed)
   Primary Cardiologist: Minus Breeding, MD  Chart reviewed as part of pre-operative protocol coverage. Simple dental extractions are considered low risk procedures per guidelines and generally do not require any specific cardiac clearance. It is also generally accepted that for simple extractions and dental cleanings, there is no need to interrupt blood thinner therapy.   SBE prophylaxis is not required for the patient.  I will route this recommendation to the requesting party via Epic fax function and remove from pre-op pool.  Please call with questions.  [place], [place] 05/05/2019, 11:25 AM
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
## 2019-05-08 ENCOUNTER — Ambulatory Visit (INDEPENDENT_AMBULATORY_CARE_PROVIDER_SITE_OTHER): Payer: 59 | Admitting: Gastroenterology

## 2019-05-08 ENCOUNTER — Encounter: Payer: Self-pay | Admitting: Gastroenterology

## 2019-05-08 VITALS — BP 104/62 | HR 72 | Ht 69.0 in | Wt 269.0 lb

## 2019-05-08 DIAGNOSIS — R748 Abnormal levels of other serum enzymes: Secondary | ICD-10-CM

## 2019-05-08 DIAGNOSIS — R932 Abnormal findings on diagnostic imaging of liver and biliary tract: Secondary | ICD-10-CM | POA: Diagnosis not present

## 2019-05-08 NOTE — Patient Instructions (Addendum)
I have recommended some additional labs to evaluate your elevated alkaline phosphatase level. If the labs suggest fatty liver, I will recommend some additional blood tests and a special ultrasound to determine the extent of inflammation and damage to your liver. If the labs do not suggest fatty liver, we will plan an MRI to further evaluate your liver.   Let us plan to follow-up in 4-6 weeks to review these results and determine if additional testing is indicated.

## 2019-05-10 ENCOUNTER — Other Ambulatory Visit: Payer: Self-pay | Admitting: Family Medicine

## 2019-05-11 ENCOUNTER — Other Ambulatory Visit (INDEPENDENT_AMBULATORY_CARE_PROVIDER_SITE_OTHER): Payer: 59

## 2019-05-11 DIAGNOSIS — R932 Abnormal findings on diagnostic imaging of liver and biliary tract: Secondary | ICD-10-CM | POA: Diagnosis not present

## 2019-05-11 DIAGNOSIS — R748 Abnormal levels of other serum enzymes: Secondary | ICD-10-CM | POA: Diagnosis not present

## 2019-05-11 LAB — GLUCOSE, RANDOM: Glucose, Bld: 155 mg/dL — ABNORMAL HIGH (ref 70–99)

## 2019-05-15 LAB — ANTI-SMOOTH MUSCLE ANTIBODY, IGG: Actin (Smooth Muscle) Antibody (IGG): <20 U (ref <20)

## 2019-05-15 LAB — MITOCHONDRIAL ANTIBODIES: Mitochondrial M2 Ab, IgG: <=20 U

## 2019-05-15 LAB — HEPATITIS B CORE ANTIBODY, IGM: Hep B C IgM: NONREACTIVE

## 2019-05-15 LAB — ANA: Anti Nuclear Antibody (ANA): NEGATIVE

## 2019-05-15 LAB — HEPATITIS B SURFACE ANTIGEN: Hepatitis B Surface Ag: NONREACTIVE

## 2019-05-15 LAB — HEPATITIS C ANTIBODY
Hepatitis C Ab: NONREACTIVE
SIGNAL TO CUT-OFF: 0.02 (ref <1.00)

## 2019-05-15 LAB — HEPATITIS B CORE ANTIBODY, TOTAL: Hep B Core Total Ab: NONREACTIVE

## 2019-05-15 LAB — IGM: IgM, Serum: 55 mg/dL (ref 50–300)

## 2019-05-15 LAB — ANGIOTENSIN CONVERTING ENZYME: Angiotensin-Converting Enzyme: 10 U/L (ref 9–67)

## 2019-05-15 LAB — IGG: IgG (Immunoglobin G), Serum: 822 mg/dL (ref 600–1540)

## 2019-05-20 LAB — NASH FIBROSURE
ALPHA 2-MACROGLOBULINS, QN: 188 mg/dL (ref 110–276)
ALT (SGPT) P5P: 47 IU/L (ref 0–55)
AST (SGOT) P5P: 46 IU/L — ABNORMAL HIGH (ref 0–40)
Apolipoprotein A-1: 113 mg/dL (ref 101–178)
Bilirubin, Total: 0.2 mg/dL (ref 0.0–1.2)
Cholesterol, Total: 122 mg/dL (ref 100–199)
Fibrosis Score: 0.26 — ABNORMAL HIGH (ref 0.00–0.21)
GGT: 89 IU/L — ABNORMAL HIGH (ref 0–65)
Glucose: 158 mg/dL — ABNORMAL HIGH (ref 65–99)
Haptoglobin: 180 mg/dL (ref 32–363)
Height: 69 in
NASH Score: 0.5 — ABNORMAL HIGH
Steatosis Score: 0.84 — ABNORMAL HIGH (ref 0.00–0.30)
Triglycerides: 165 mg/dL — ABNORMAL HIGH (ref 0–149)
Weight: 269 [lb_av]

## 2019-05-20 LAB — INSULIN, FREE AND TOTAL
Free Insulin: 28 uU/mL — ABNORMAL HIGH
Total Insulin: 28 uU/mL

## 2019-05-20 LAB — ALKALINE PHOSPHATASE, ISOENZYMES
Alkaline Phosphatase: 134 IU/L — ABNORMAL HIGH (ref 39–117)
BONE FRACTION: 92 % — ABNORMAL HIGH (ref 12–68)
INTESTINAL FRAC.: 4 % (ref 0–18)
LIVER FRACTION: 4 % — ABNORMAL LOW (ref 13–88)

## 2019-05-23 ENCOUNTER — Other Ambulatory Visit: Payer: Self-pay | Admitting: Family Medicine

## 2019-05-23 DIAGNOSIS — N481 Balanitis: Secondary | ICD-10-CM

## 2019-05-28 ENCOUNTER — Other Ambulatory Visit: Payer: Self-pay | Admitting: Cardiology

## 2019-05-29 ENCOUNTER — Other Ambulatory Visit: Payer: Self-pay | Admitting: Cardiology

## 2019-05-29 MED ORDER — METOPROLOL SUCCINATE ER 50 MG PO TB24: ORAL_TABLET | ORAL | 1 refills | Status: DC

## 2019-05-29 MED ORDER — PRASUGREL HCL 10 MG PO TABS: 10.0000 mg | ORAL_TABLET | Freq: Every day | ORAL | 2 refills | Status: DC

## 2019-05-29 NOTE — Telephone Encounter (Signed)
Pt's medication was sent to pt's pharmacy as requested. Confirmation received.  °

## 2019-06-03 ENCOUNTER — Other Ambulatory Visit: Payer: Self-pay

## 2019-06-03 ENCOUNTER — Encounter: Payer: Self-pay | Admitting: Nutrition

## 2019-06-03 ENCOUNTER — Encounter: Payer: 59 | Attending: Family Medicine | Admitting: Nutrition

## 2019-06-03 VITALS — Ht 70.0 in | Wt 270.0 lb

## 2019-06-03 DIAGNOSIS — E1159 Type 2 diabetes mellitus with other circulatory complications: Secondary | ICD-10-CM | POA: Insufficient documentation

## 2019-06-03 DIAGNOSIS — E119 Type 2 diabetes mellitus without complications: Secondary | ICD-10-CM | POA: Insufficient documentation

## 2019-06-03 DIAGNOSIS — I152 Hypertension secondary to endocrine disorders: Secondary | ICD-10-CM

## 2019-06-03 DIAGNOSIS — I10 Essential (primary) hypertension: Secondary | ICD-10-CM | POA: Insufficient documentation

## 2019-06-03 DIAGNOSIS — E1169 Type 2 diabetes mellitus with other specified complication: Secondary | ICD-10-CM | POA: Diagnosis present

## 2019-06-03 DIAGNOSIS — E785 Hyperlipidemia, unspecified: Secondary | ICD-10-CM | POA: Insufficient documentation

## 2019-06-03 DIAGNOSIS — I2510 Atherosclerotic heart disease of native coronary artery without angina pectoris: Secondary | ICD-10-CM | POA: Diagnosis present

## 2019-06-03 NOTE — Patient Instructions (Addendum)
   Goals Keep drinking water Reduce oatmeal to 1 pack with breakfast and have eggs, and fruit, Cut out snacks between meals Try to eat by 7 pm. Keep walking 30 minutes a day Lose 3-5 months

## 2019-06-03 NOTE — Progress Notes (Signed)
Medical Nutrition Therapy:  Appt start time: 1400 end time:  1430.   Assessment:  Primary concerns today: Diabetes Type 2. Lives by himself with girlfriend. Testing twice a day.  He eats out most of the time. He notes he has cut out his diet sodas and drinking sparkling water. Stays hungry and eats between meals and after meals. 7 day avg 206 mg/dl.  14 day avg 201 mg/dl,  30 day.198 mg/dl He struggles to make changes with his diet and eating patterns. Stays hungry and eats snacks and after supper often contributing to elevated blood sugar.He continues to eat high processed high fat meats which is not recommended due to severe CVD.  Lab Results  Component Value Date   HGBA1C 8.6 (H) 03/09/2019   CMP Latest Ref Rng & Units 05/11/2019 03/18/2019 03/09/2019  Glucose 70 - 99 mg/dL 155(H) 178(H) 204(H)  BUN 8 - 27 mg/dL - 17 17  Creatinine 0.76 - 1.27 mg/dL - 0.72(L) 0.77  Sodium 134 - 144 mmol/L - 140 140  Potassium 3.5 - 5.2 mmol/L - 4.5 4.5  Chloride 96 - 106 mmol/L - 103 102  CO2 20 - 29 mmol/L - 22 23  Calcium 8.6 - 10.2 mg/dL - 8.9 9.3  Total Protein 6.0 - 8.5 g/dL - 6.5 6.8  Total Bilirubin 0.0 - 1.2 mg/dL - 0.3 0.4  Alkaline Phos 39 - 117 IU/L 134(H) 146(H) 154(H)  AST 0 - 40 IU/L - 40 33  ALT 0 - 44 IU/L - 30 32   Lipid Panel     Component Value Date/Time   CHOL 122 05/11/2019 0841   TRIG 165 (H) 05/11/2019 0841   HDL 43 03/09/2019 0855   HDL CANCELED 03/21/2016 1037   HDL 41 01/19/2013 0819   CHOLHDL 3.1 03/09/2019 0855   CHOLHDL 3.6 09/04/2010 0631   VLDL 68 (H) 09/04/2010 0631   LDLCALC 56 03/09/2019 0855   LDLCALC 34 01/19/2013 0819   LDLDIRECT 95 03/23/2015 0958     Preferred Learning Style:   No preference indicated   Learning Readiness:  Ready  Change in progress   MEDICATIONS:   DIETARY INTAKE:  24-hr recall   B ( AM):  3 packs oatmeal and some eggs Snk ( AM):  L ( PM): skipped:   Snk ( PM):  D ( PM):  [place] 10 slices, l Broccoli,  carrots, tomatoes, celery, ranch dressing  Snk ( PM): french fries, kup, sparkling water Strawberries,  Beverages:water or sparking water  Usual physical activity: walks some  Estimated energy needs: 1800  calories 200 g carbohydrates 135 g protein 50 g fat  Progress Towards Goal(s):  In progress.   Nutritional Diagnosis:  NB-1.1 Food and nutrition-related knowledge deficit As related to Diabetes.  As evidenced by A1C 9%.    Intervention:  Nutrition and Diabetes education provided on My Plate, CHO counting, meal planning, portion sizes, timing of meals, avoiding snacks between meals unless having a low blood sugar, target ranges for A1C and blood sugars, signs/symptoms and treatment of hyper/hypoglycemia, monitoring blood sugars, taking medications as prescribed, benefits of exercising 30 minutes per day and prevention of complications of DM.  Goals Keep drinking water Reduce oatmeal to 1 pack with breakfast and have eggs, and fruit, Cut out snacks between meals Try to eat by 7 pm. Keep walking 30 minutes a day Lose 3-5 months  Cut out processed foods. .  Teaching Method Utilized: Visual Auditory Hands on  Handouts given during visit include:  The Plate Method   Diabetes instructions  Hyperlipidemia   Barriers to learning/adherence to lifestyle change: none  Demonstrated degree of understanding via:  Teach Back   Monitoring/Evaluation: He doesn't wish to make a follow up appointment.

## 2019-06-08 ENCOUNTER — Other Ambulatory Visit: Payer: Self-pay

## 2019-06-08 ENCOUNTER — Ambulatory Visit (INDEPENDENT_AMBULATORY_CARE_PROVIDER_SITE_OTHER): Payer: 59 | Admitting: Gastroenterology

## 2019-06-08 ENCOUNTER — Encounter: Payer: Self-pay | Admitting: Gastroenterology

## 2019-06-08 VITALS — BP 102/70 | HR 65 | Temp 97.6°F | Ht 69.0 in | Wt 269.0 lb

## 2019-06-08 DIAGNOSIS — R748 Abnormal levels of other serum enzymes: Secondary | ICD-10-CM | POA: Diagnosis not present

## 2019-06-08 DIAGNOSIS — K760 Fatty (change of) liver, not elsewhere classified: Secondary | ICD-10-CM
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
## 2019-06-08 NOTE — Patient Instructions (Addendum)
Your labs and ultrasound showed fatty liver. Thankfully, there is no significant liver damage. This means there is no cirrhosis. However, given your diabetes, you are at risk for developing liver damage over time. We should plan to follow-up in one year with labs and an ultrasound to see if there have been any changes.  Please follow-up with NP [name] for further evaluation of your abnormal lab results.  I will look forward to seeing you in one year. Please love your liver between now and this. This means avoiding beer, wine, liquor, and non-alcoholic beer.  Please call with any questions or concerns before then.

## 2019-06-12 ENCOUNTER — Other Ambulatory Visit: Payer: Self-pay

## 2019-06-15 ENCOUNTER — Other Ambulatory Visit: Payer: Self-pay

## 2019-06-15 ENCOUNTER — Encounter: Payer: Self-pay | Admitting: Family Medicine

## 2019-06-15 ENCOUNTER — Ambulatory Visit (INDEPENDENT_AMBULATORY_CARE_PROVIDER_SITE_OTHER): Payer: 59 | Admitting: Family Medicine

## 2019-06-15 VITALS — BP 114/74 | HR 66 | Temp 96.6°F | Resp 20 | Ht 69.0 in | Wt 268.0 lb

## 2019-06-15 DIAGNOSIS — E559 Vitamin D deficiency, unspecified: Secondary | ICD-10-CM | POA: Diagnosis not present

## 2019-06-15 DIAGNOSIS — E1169 Type 2 diabetes mellitus with other specified complication: Secondary | ICD-10-CM | POA: Diagnosis not present

## 2019-06-15 DIAGNOSIS — K760 Fatty (change of) liver, not elsewhere classified: Secondary | ICD-10-CM | POA: Diagnosis not present

## 2019-06-15 DIAGNOSIS — Z23 Encounter for immunization: Secondary | ICD-10-CM | POA: Diagnosis not present

## 2019-06-15 DIAGNOSIS — E785 Hyperlipidemia, unspecified: Secondary | ICD-10-CM

## 2019-06-15 DIAGNOSIS — I10 Essential (primary) hypertension: Secondary | ICD-10-CM

## 2019-06-15 DIAGNOSIS — I152 Hypertension secondary to endocrine disorders: Secondary | ICD-10-CM

## 2019-06-15 DIAGNOSIS — E119 Type 2 diabetes mellitus without complications: Secondary | ICD-10-CM

## 2019-06-15 DIAGNOSIS — E1159 Type 2 diabetes mellitus with other circulatory complications: Secondary | ICD-10-CM | POA: Diagnosis not present

## 2019-06-15 DIAGNOSIS — N401 Enlarged prostate with lower urinary tract symptoms: Secondary | ICD-10-CM

## 2019-06-15 DIAGNOSIS — N3943 Post-void dribbling: Secondary | ICD-10-CM

## 2019-06-15 LAB — BAYER DCA HB A1C WAIVED: HB A1C (BAYER DCA - WAIVED): 7.9 % — ABNORMAL HIGH (ref <7.0)

## 2019-06-15 MED ORDER — METFORMIN HCL 1000 MG PO TABS: 1000.0000 mg | ORAL_TABLET | Freq: Two times a day (BID) | ORAL | 3 refills | Status: DC

## 2019-06-15 MED ORDER — TAMSULOSIN HCL 0.4 MG PO CAPS: 0.4000 mg | ORAL_CAPSULE | Freq: Every day | ORAL | 3 refills | Status: DC

## 2019-06-15 NOTE — Patient Instructions (Signed)
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
## 2019-06-16 ENCOUNTER — Telehealth: Payer: Self-pay | Admitting: Family Medicine

## 2019-06-16 LAB — LIPID PANEL
Chol/HDL Ratio: 3.3 ratio (ref 0.0–5.0)
Cholesterol, Total: 125 mg/dL (ref 100–199)
HDL: 38 mg/dL — ABNORMAL LOW
LDL Chol Calc (NIH): 65 mg/dL (ref 0–99)
Triglycerides: 119 mg/dL (ref 0–149)
VLDL Cholesterol Cal: 22 mg/dL (ref 5–40)

## 2019-06-16 LAB — CMP14+EGFR
ALT: 39 [IU]/L (ref 0–44)
AST: 49 [IU]/L — ABNORMAL HIGH (ref 0–40)
Albumin/Globulin Ratio: 1.8 (ref 1.2–2.2)
Albumin: 4.2 g/dL (ref 3.8–4.8)
Alkaline Phosphatase: 125 [IU]/L — ABNORMAL HIGH (ref 39–117)
BUN/Creatinine Ratio: 13 (ref 10–24)
BUN: 11 mg/dL (ref 8–27)
Bilirubin Total: 0.3 mg/dL (ref 0.0–1.2)
CO2: 23 mmol/L (ref 20–29)
Calcium: 9.4 mg/dL (ref 8.6–10.2)
Chloride: 106 mmol/L (ref 96–106)
Creatinine, Ser: 0.84 mg/dL (ref 0.76–1.27)
GFR calc Af Amer: 108 mL/min/{1.73_m2}
GFR calc non Af Amer: 94 mL/min/{1.73_m2}
Globulin, Total: 2.3 g/dL (ref 1.5–4.5)
Glucose: 140 mg/dL — ABNORMAL HIGH (ref 65–99)
Potassium: 5 mmol/L (ref 3.5–5.2)
Sodium: 144 mmol/L (ref 134–144)
Total Protein: 6.5 g/dL (ref 6.0–8.5)

## 2019-06-16 LAB — CBC WITH DIFFERENTIAL/PLATELET
Basophils Absolute: 0.1 10*3/uL (ref 0.0–0.2)
Basos: 1 %
EOS (ABSOLUTE): 0.5 10*3/uL — ABNORMAL HIGH (ref 0.0–0.4)
Eos: 4 %
Hematocrit: 52.1 % — ABNORMAL HIGH (ref 37.5–51.0)
Hemoglobin: 17.7 g/dL (ref 13.0–17.7)
Immature Grans (Abs): 0.1 10*3/uL (ref 0.0–0.1)
Immature Granulocytes: 1 %
Lymphocytes Absolute: 5.5 10*3/uL — ABNORMAL HIGH (ref 0.7–3.1)
Lymphs: 51 %
MCH: 33.3 pg — ABNORMAL HIGH (ref 26.6–33.0)
MCHC: 34 g/dL (ref 31.5–35.7)
MCV: 98 fL — ABNORMAL HIGH (ref 79–97)
Monocytes Absolute: 1.2 10*3/uL — ABNORMAL HIGH (ref 0.1–0.9)
Monocytes: 11 %
Neutrophils Absolute: 3.5 10*3/uL (ref 1.4–7.0)
Neutrophils: 32 %
Platelets: 246 10*3/uL (ref 150–450)
RBC: 5.32 x10E6/uL (ref 4.14–5.80)
RDW: 13.5 % (ref 11.6–15.4)
WBC: 10.8 10*3/uL (ref 3.4–10.8)

## 2019-06-16 NOTE — Telephone Encounter (Signed)
Pt called - currently on short term disability. [place] filled out forms recently [employer] sending more forms to fill out - FYI>

## 2019-06-17 NOTE — Telephone Encounter (Signed)
Forms received in process

## 2019-07-17 ENCOUNTER — Other Ambulatory Visit: Payer: Self-pay | Admitting: Family Medicine

## 2019-07-17 DIAGNOSIS — N481 Balanitis: Secondary | ICD-10-CM

## 2019-07-21 ENCOUNTER — Other Ambulatory Visit: Payer: Self-pay

## 2019-07-22 ENCOUNTER — Ambulatory Visit (INDEPENDENT_AMBULATORY_CARE_PROVIDER_SITE_OTHER): Payer: 59 | Admitting: Family Medicine

## 2019-07-22 ENCOUNTER — Encounter: Payer: Self-pay | Admitting: Family Medicine

## 2019-07-22 VITALS — BP 105/70 | HR 66 | Temp 96.8°F | Resp 20 | Ht 69.0 in | Wt 267.0 lb

## 2019-07-22 DIAGNOSIS — R110 Nausea: Secondary | ICD-10-CM | POA: Diagnosis not present

## 2019-07-22 DIAGNOSIS — E785 Hyperlipidemia, unspecified: Secondary | ICD-10-CM

## 2019-07-22 DIAGNOSIS — E1169 Type 2 diabetes mellitus with other specified complication: Secondary | ICD-10-CM | POA: Diagnosis not present

## 2019-07-22 DIAGNOSIS — E119 Type 2 diabetes mellitus without complications: Secondary | ICD-10-CM | POA: Diagnosis not present

## 2019-07-22 MED ORDER — ONDANSETRON HCL 8 MG PO TABS: 8.0000 mg | ORAL_TABLET | Freq: Three times a day (TID) | ORAL | 2 refills | Status: DC | PRN

## 2019-07-22 MED ORDER — METFORMIN HCL 500 MG PO TABS: 500.0000 mg | ORAL_TABLET | Freq: Two times a day (BID) | ORAL | 3 refills | Status: DC
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
## 2019-07-22 NOTE — Patient Instructions (Signed)

## 2019-08-19 ENCOUNTER — Ambulatory Visit: Payer: 59 | Admitting: Family Medicine

## 2019-09-07 ENCOUNTER — Other Ambulatory Visit: Payer: Self-pay | Admitting: Family Medicine

## 2019-09-07 DIAGNOSIS — N401 Enlarged prostate with lower urinary tract symptoms: Secondary | ICD-10-CM

## 2019-09-07 DIAGNOSIS — N3943 Post-void dribbling: Secondary | ICD-10-CM

## 2019-09-08 ENCOUNTER — Other Ambulatory Visit: Payer: Self-pay | Admitting: Family Medicine

## 2019-09-16 ENCOUNTER — Other Ambulatory Visit: Payer: Self-pay

## 2019-09-16 ENCOUNTER — Ambulatory Visit (INDEPENDENT_AMBULATORY_CARE_PROVIDER_SITE_OTHER): Payer: 59 | Admitting: Family Medicine

## 2019-09-16 ENCOUNTER — Encounter: Payer: Self-pay | Admitting: Family Medicine

## 2019-09-16 VITALS — BP 119/75 | HR 62 | Temp 98.7°F | Resp 18 | Ht 69.0 in | Wt 267.0 lb

## 2019-09-16 DIAGNOSIS — E119 Type 2 diabetes mellitus without complications: Secondary | ICD-10-CM | POA: Diagnosis not present

## 2019-09-16 DIAGNOSIS — I152 Hypertension secondary to endocrine disorders: Secondary | ICD-10-CM

## 2019-09-16 DIAGNOSIS — K760 Fatty (change of) liver, not elsewhere classified: Secondary | ICD-10-CM

## 2019-09-16 DIAGNOSIS — E785 Hyperlipidemia, unspecified: Secondary | ICD-10-CM

## 2019-09-16 DIAGNOSIS — E1169 Type 2 diabetes mellitus with other specified complication: Secondary | ICD-10-CM | POA: Diagnosis not present

## 2019-09-16 DIAGNOSIS — E1159 Type 2 diabetes mellitus with other circulatory complications: Secondary | ICD-10-CM | POA: Diagnosis not present

## 2019-09-16 DIAGNOSIS — I10 Essential (primary) hypertension: Secondary | ICD-10-CM

## 2019-09-16 LAB — BAYER DCA HB A1C WAIVED: HB A1C (BAYER DCA - WAIVED): 7.8 % — ABNORMAL HIGH (ref <7.0)

## 2019-09-16 NOTE — Patient Instructions (Signed)
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
## 2019-09-17 LAB — CBC WITH DIFFERENTIAL/PLATELET
Basophils Absolute: 0.1 10*3/uL (ref 0.0–0.2)
Basos: 1 %
EOS (ABSOLUTE): 0.3 10*3/uL (ref 0.0–0.4)
Eos: 2 %
Hematocrit: 46.8 % (ref 37.5–51.0)
Hemoglobin: 16.5 g/dL (ref 13.0–17.7)
Immature Grans (Abs): 0.1 10*3/uL (ref 0.0–0.1)
Immature Granulocytes: 1 %
Lymphocytes Absolute: 6.2 10*3/uL — ABNORMAL HIGH (ref 0.7–3.1)
Lymphs: 53 %
MCH: 33.8 pg — ABNORMAL HIGH (ref 26.6–33.0)
MCHC: 35.3 g/dL (ref 31.5–35.7)
MCV: 96 fL (ref 79–97)
Monocytes Absolute: 1.3 10*3/uL — ABNORMAL HIGH (ref 0.1–0.9)
Monocytes: 11 %
Neutrophils Absolute: 3.7 10*3/uL (ref 1.4–7.0)
Neutrophils: 32 %
Platelets: 246 10*3/uL (ref 150–450)
RBC: 4.88 x10E6/uL (ref 4.14–5.80)
RDW: 13 % (ref 11.6–15.4)
WBC: 11.6 10*3/uL — ABNORMAL HIGH (ref 3.4–10.8)

## 2019-09-17 LAB — CMP14+EGFR
ALT: 27 IU/L (ref 0–44)
AST: 36 IU/L (ref 0–40)
Albumin/Globulin Ratio: 1.7 (ref 1.2–2.2)
Albumin: 4 g/dL (ref 3.8–4.8)
Alkaline Phosphatase: 115 IU/L (ref 39–117)
BUN/Creatinine Ratio: 26 — ABNORMAL HIGH (ref 10–24)
BUN: 21 mg/dL (ref 8–27)
Bilirubin Total: 0.3 mg/dL (ref 0.0–1.2)
CO2: 23 mmol/L (ref 20–29)
Calcium: 9.3 mg/dL (ref 8.6–10.2)
Chloride: 103 mmol/L (ref 96–106)
Creatinine, Ser: 0.8 mg/dL (ref 0.76–1.27)
GFR calc Af Amer: 111 mL/min/{1.73_m2} (ref >59)
GFR calc non Af Amer: 96 mL/min/{1.73_m2} (ref >59)
Globulin, Total: 2.4 g/dL (ref 1.5–4.5)
Glucose: 142 mg/dL — ABNORMAL HIGH (ref 65–99)
Potassium: 4.6 mmol/L (ref 3.5–5.2)
Sodium: 140 mmol/L (ref 134–144)
Total Protein: 6.4 g/dL (ref 6.0–8.5)

## 2019-09-17 LAB — LIPID PANEL
Chol/HDL Ratio: 3.5 ratio (ref 0.0–5.0)
Cholesterol, Total: 108 mg/dL (ref 100–199)
HDL: 31 mg/dL — ABNORMAL LOW (ref >39)
LDL Chol Calc (NIH): 44 mg/dL (ref 0–99)
Triglycerides: 204 mg/dL — ABNORMAL HIGH (ref 0–149)
VLDL Cholesterol Cal: 33 mg/dL (ref 5–40)

## 2019-09-17 LAB — GAMMA GT: GGT: 85 IU/L — ABNORMAL HIGH (ref 0–65)

## 2019-10-16 ENCOUNTER — Other Ambulatory Visit: Payer: Self-pay | Admitting: Family Medicine

## 2019-11-12 ENCOUNTER — Encounter: Payer: Self-pay | Admitting: *Deleted

## 2019-11-12 IMAGING — US ULTRASOUND ABDOMEN LIMITED
1 series · 14 of 25 positions shown · non-contrast
Comparison: None.

CLINICAL DATA: Elevated liver function tests.

EXAM:
ULTRASOUND ABDOMEN LIMITED RIGHT UPPER QUADRANT

[Series 1: ultrasound abdomen limited · 14 of 48 slices shown]
[im 1/48]
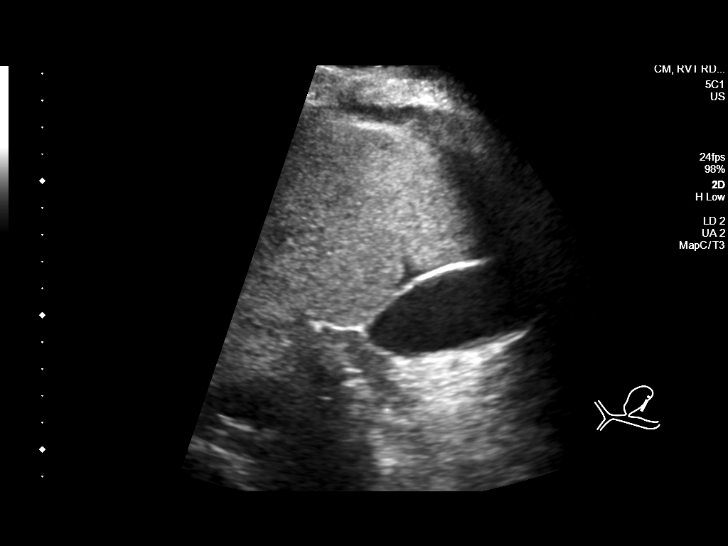
[im 4/48]
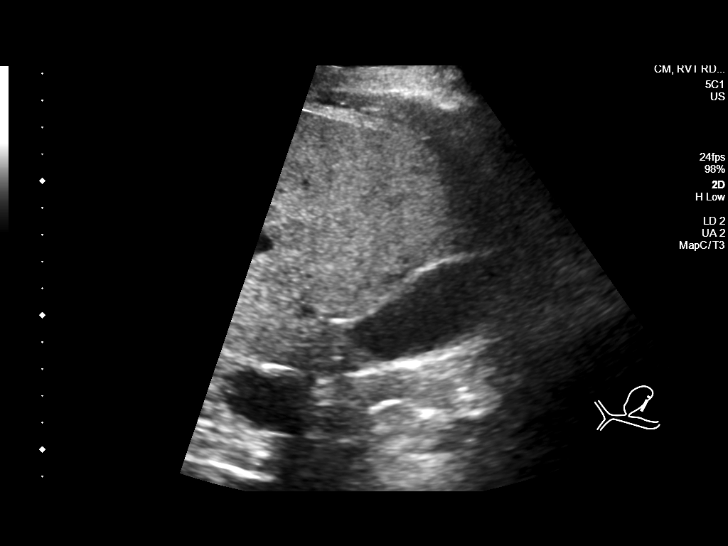
[im 8/48]
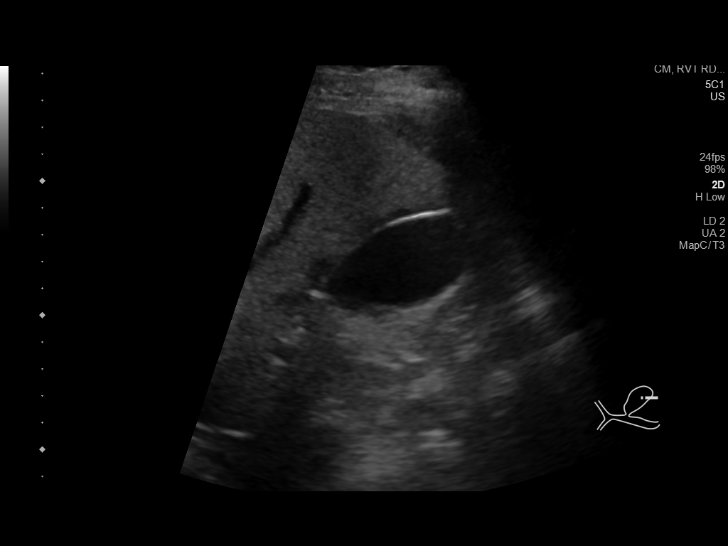
[im 12/48]
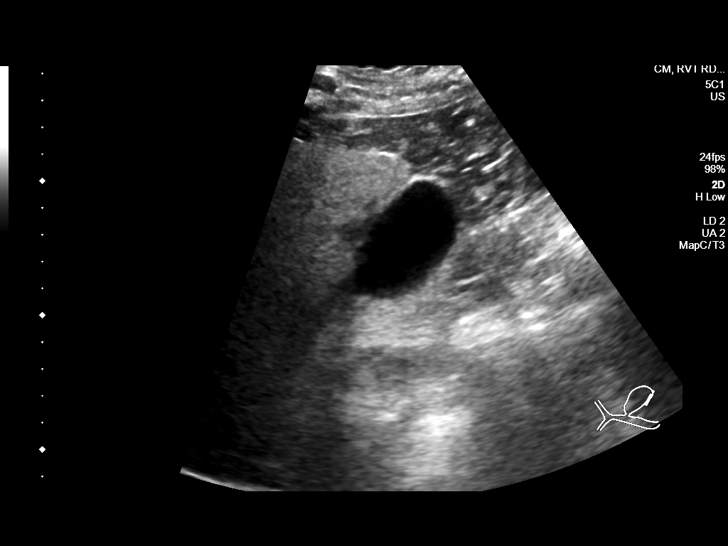
[im 16/48]
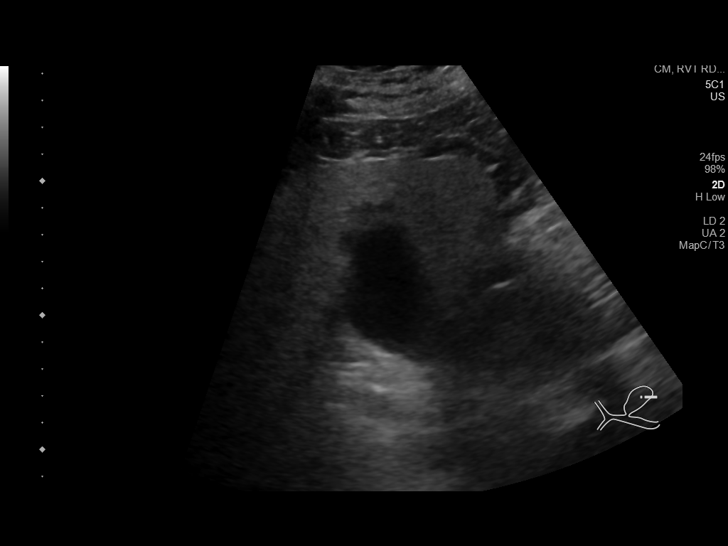
[im 18/48]
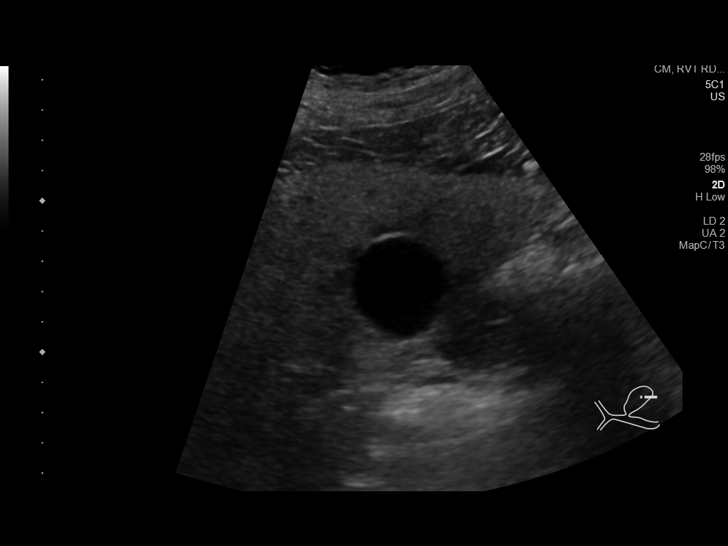
[im 22/48]
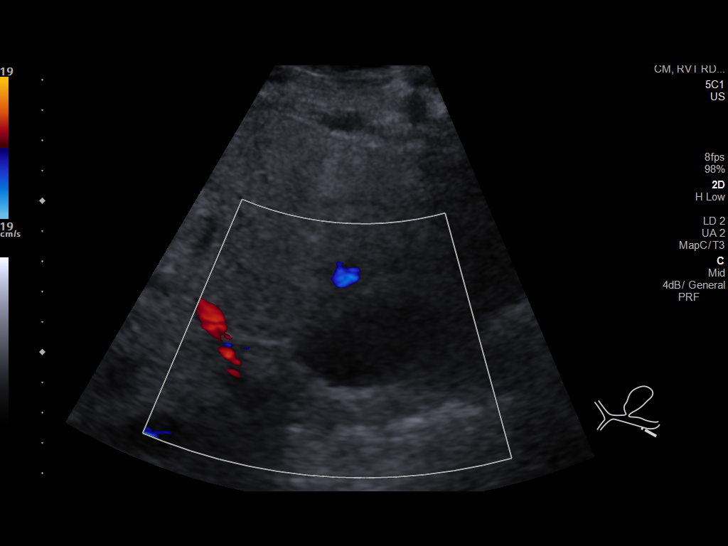
[im 26/48]
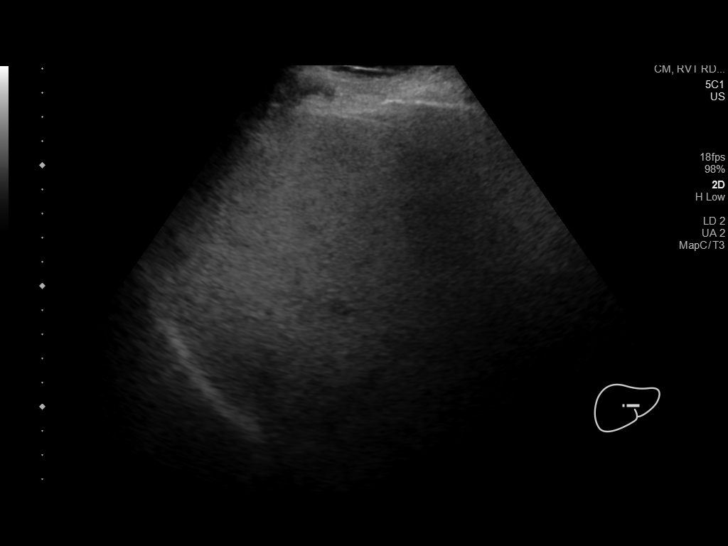
[im 30/48]
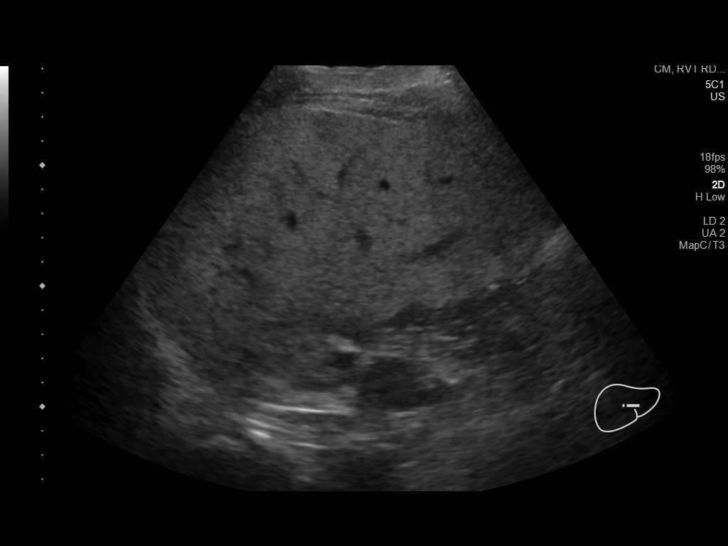
[im 32/48]
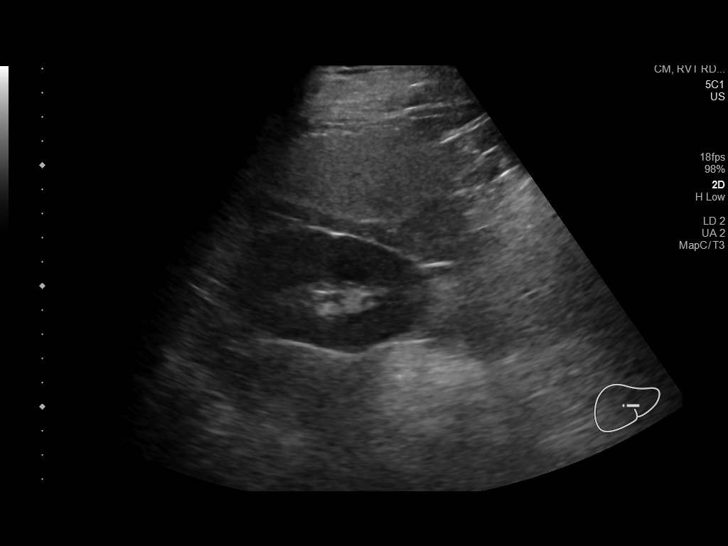
[im 36/48]
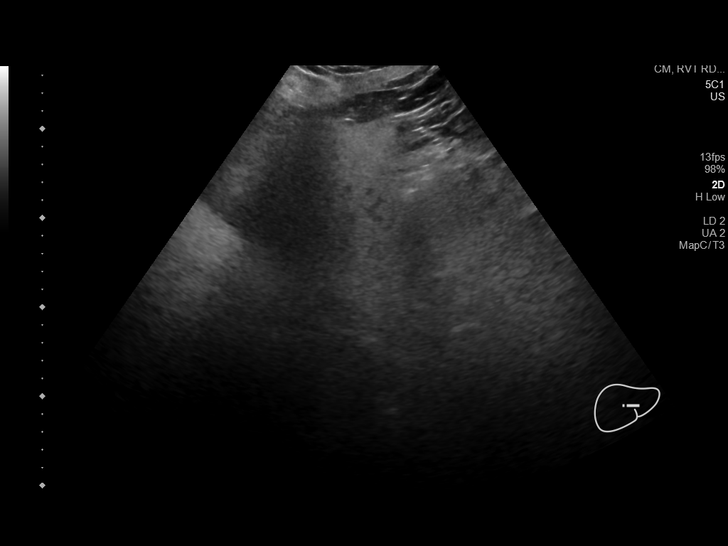
[im 40/48]
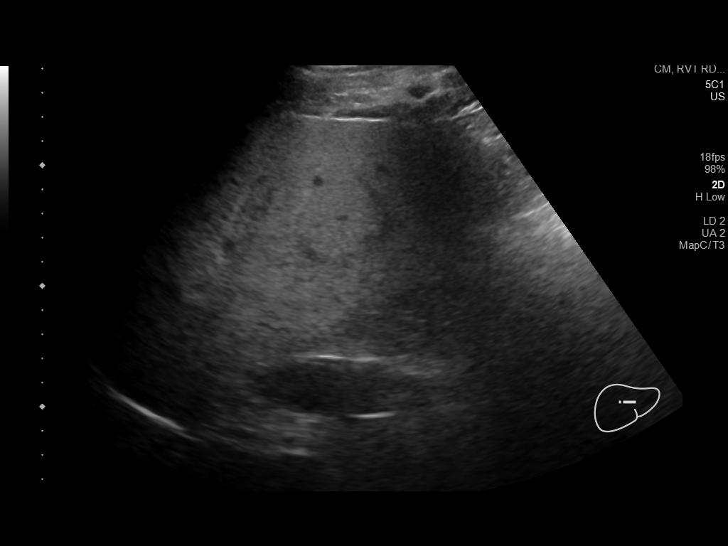
[im 44/48]
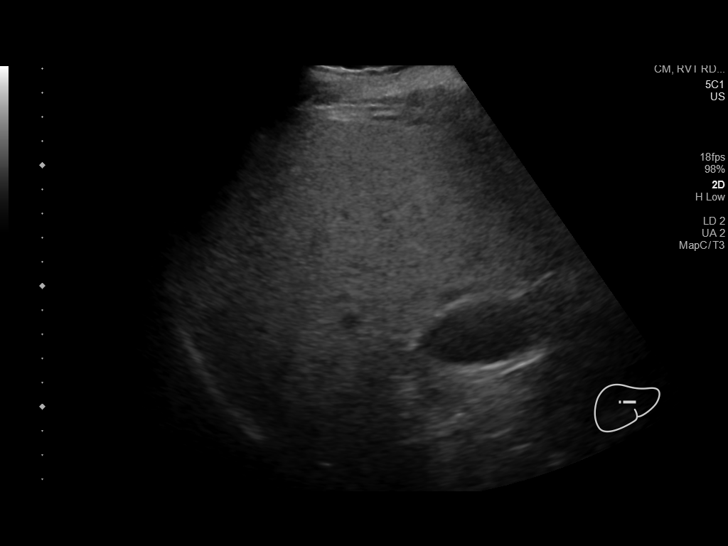
[im 48/48]
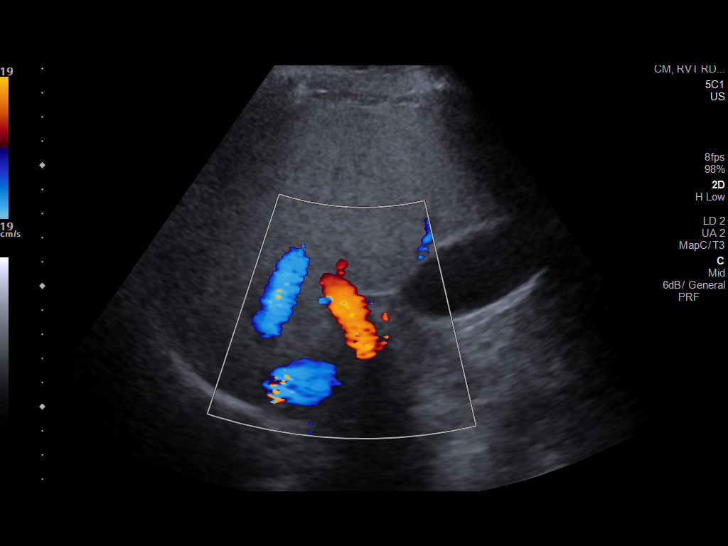

[14 of 25 positions shown; findings below may reference images not displayed]

FINDINGS: Gallbladder:

No gallstones or wall thickening visualized. No sonographic Murphy
sign noted by sonographer.

Common bile duct:

Diameter: 2.4 mm

Liver:

No focal lesion identified. Diffusely increased parenchymal
echogenicity. Portal vein is patent on color Doppler imaging with
normal direction of blood flow towards the liver.

Other: None.
IMPRESSION: Diffusely increased parenchymal echogenicity of the liver usually
associated with hepatic steatosis or fibrosis.

Normal appearance of the gallbladder.

## 2019-11-17 ENCOUNTER — Other Ambulatory Visit: Payer: Self-pay | Admitting: Cardiology

## 2019-11-17 NOTE — Telephone Encounter (Signed)
Rx request sent to pharmacy.  

## 2019-11-22 ENCOUNTER — Other Ambulatory Visit: Payer: Self-pay | Admitting: Cardiology

## 2019-12-15 ENCOUNTER — Other Ambulatory Visit: Payer: Self-pay | Admitting: *Deleted

## 2019-12-15 MED ORDER — ACCU-CHEK GUIDE VI STRP: ORAL_STRIP | 3 refills | Status: DC

## 2019-12-17 ENCOUNTER — Other Ambulatory Visit: Payer: Self-pay | Admitting: Cardiology

## 2019-12-21 ENCOUNTER — Other Ambulatory Visit: Payer: Self-pay | Admitting: *Deleted

## 2019-12-21 MED ORDER — VALACYCLOVIR HCL 500 MG PO TABS: 500.0000 mg | ORAL_TABLET | Freq: Every day | ORAL | 0 refills | Status: DC
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
## 2019-12-24 ENCOUNTER — Other Ambulatory Visit: Payer: Self-pay

## 2019-12-24 ENCOUNTER — Ambulatory Visit: Payer: 59 | Admitting: Family Medicine

## 2019-12-24 ENCOUNTER — Ambulatory Visit (INDEPENDENT_AMBULATORY_CARE_PROVIDER_SITE_OTHER): Payer: 59 | Admitting: Family Medicine

## 2019-12-24 ENCOUNTER — Encounter: Payer: Self-pay | Admitting: Family Medicine

## 2019-12-24 VITALS — BP 127/81 | HR 64 | Temp 97.3°F | Ht 69.0 in | Wt 259.4 lb

## 2019-12-24 DIAGNOSIS — E119 Type 2 diabetes mellitus without complications: Secondary | ICD-10-CM | POA: Diagnosis not present

## 2019-12-24 DIAGNOSIS — E1159 Type 2 diabetes mellitus with other circulatory complications: Secondary | ICD-10-CM | POA: Diagnosis not present

## 2019-12-24 DIAGNOSIS — E559 Vitamin D deficiency, unspecified: Secondary | ICD-10-CM | POA: Diagnosis not present

## 2019-12-24 DIAGNOSIS — I10 Essential (primary) hypertension: Secondary | ICD-10-CM

## 2019-12-24 LAB — BAYER DCA HB A1C WAIVED: HB A1C (BAYER DCA - WAIVED): 7.1 % — ABNORMAL HIGH (ref <7.0)

## 2019-12-24 NOTE — Patient Instructions (Signed)

## 2019-12-25 LAB — VITAMIN D 25 HYDROXY (VIT D DEFICIENCY, FRACTURES): Vit D, 25-Hydroxy: 50.5 ng/mL (ref 30.0–100.0)

## 2019-12-25 LAB — MICROALBUMIN / CREATININE URINE RATIO
Creatinine, Urine: 235.6 mg/dL
Microalb/Creat Ratio: 21 mg/g creat (ref 0–29)
Microalbumin, Urine: 49.6 ug/mL

## 2019-12-25 LAB — CMP14+EGFR
ALT: 14 IU/L (ref 0–44)
AST: 27 IU/L (ref 0–40)
Albumin/Globulin Ratio: 1.4 (ref 1.2–2.2)
Albumin: 3.9 g/dL (ref 3.8–4.8)
Alkaline Phosphatase: 87 IU/L (ref 39–117)
BUN/Creatinine Ratio: 25 — ABNORMAL HIGH (ref 10–24)
BUN: 18 mg/dL (ref 8–27)
Bilirubin Total: 0.3 mg/dL (ref 0.0–1.2)
CO2: 24 mmol/L (ref 20–29)
Calcium: 9.2 mg/dL (ref 8.6–10.2)
Chloride: 105 mmol/L (ref 96–106)
Creatinine, Ser: 0.73 mg/dL — ABNORMAL LOW (ref 0.76–1.27)
GFR calc Af Amer: 115 mL/min/{1.73_m2} (ref >59)
GFR calc non Af Amer: 99 mL/min/{1.73_m2} (ref >59)
Globulin, Total: 2.8 g/dL (ref 1.5–4.5)
Glucose: 142 mg/dL — ABNORMAL HIGH (ref 65–99)
Potassium: 4.4 mmol/L (ref 3.5–5.2)
Sodium: 142 mmol/L (ref 134–144)
Total Protein: 6.7 g/dL (ref 6.0–8.5)

## 2019-12-27 ENCOUNTER — Encounter: Payer: Self-pay | Admitting: Family Medicine

## 2020-01-06 ENCOUNTER — Ambulatory Visit (INDEPENDENT_AMBULATORY_CARE_PROVIDER_SITE_OTHER): Payer: 59 | Admitting: Cardiology

## 2020-01-06 ENCOUNTER — Other Ambulatory Visit: Payer: Self-pay

## 2020-01-06 ENCOUNTER — Encounter: Payer: Self-pay | Admitting: Cardiology

## 2020-01-06 VITALS — BP 118/80 | HR 70 | Ht 70.0 in | Wt 258.0 lb

## 2020-01-06 DIAGNOSIS — I10 Essential (primary) hypertension: Secondary | ICD-10-CM | POA: Diagnosis not present

## 2020-01-06 DIAGNOSIS — I2510 Atherosclerotic heart disease of native coronary artery without angina pectoris: Secondary | ICD-10-CM | POA: Diagnosis not present

## 2020-01-06 DIAGNOSIS — E785 Hyperlipidemia, unspecified: Secondary | ICD-10-CM

## 2020-01-06 DIAGNOSIS — Z7189 Other specified counseling: Secondary | ICD-10-CM | POA: Diagnosis not present
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
## 2020-01-06 NOTE — Patient Instructions (Signed)
Medication Instructions:  Continue current medications as listed.  *If you need a refill on your cardiac medications before your next appointment, please call your pharmacy*  Follow-Up: At [facility], you and your health needs are our priority.  As part of our continuing mission to provide you with exceptional heart care, we have created designated Provider Care Teams.  These Care Teams include your primary Cardiologist (physician) and Advanced Practice Providers (APPs -  Physician Assistants and Nurse Practitioners) who all work together to provide you with the care you need, when you need it.  We recommend signing up for the patient portal called "MyChart".  Sign up information is provided on this After Visit Summary.  MyChart is used to connect with patients for Virtual Visits (Telemedicine).  Patients are able to view lab/test results, encounter notes, upcoming appointments, etc.  Non-urgent messages can be sent to your provider as well.   To learn more about what you can do with MyChart, go to [email or web address].    Your next appointment:   12 month(s)  The format for your next appointment:   In Person  Provider:   Rollene Rotunda, MD   Thank you for choosing [facility]!!

## 2020-02-19 ENCOUNTER — Other Ambulatory Visit: Payer: Self-pay | Admitting: Cardiology

## 2020-02-22 NOTE — Telephone Encounter (Signed)
Rx(s) sent to pharmacy electronically.  

## 2020-02-29 ENCOUNTER — Other Ambulatory Visit: Payer: Self-pay

## 2020-03-10 ENCOUNTER — Ambulatory Visit (INDEPENDENT_AMBULATORY_CARE_PROVIDER_SITE_OTHER): Payer: 59 | Admitting: Family Medicine

## 2020-03-10 ENCOUNTER — Other Ambulatory Visit: Payer: Self-pay

## 2020-03-10 ENCOUNTER — Encounter: Payer: Self-pay | Admitting: Family Medicine

## 2020-03-10 VITALS — BP 128/83 | HR 70 | Temp 95.3°F | Ht 70.0 in | Wt 260.0 lb

## 2020-03-10 DIAGNOSIS — E785 Hyperlipidemia, unspecified: Secondary | ICD-10-CM | POA: Diagnosis not present

## 2020-03-10 DIAGNOSIS — M546 Pain in thoracic spine: Secondary | ICD-10-CM | POA: Diagnosis not present

## 2020-03-10 DIAGNOSIS — E1169 Type 2 diabetes mellitus with other specified complication: Secondary | ICD-10-CM

## 2020-03-10 MED ORDER — PREDNISONE 10 MG (21) PO TBPK: ORAL_TABLET | ORAL | 0 refills | Status: DC

## 2020-03-10 MED ORDER — METHYLPREDNISOLONE ACETATE 80 MG/ML IJ SUSP
80.0000 mg | Freq: Once | INTRAMUSCULAR | Status: AC
Start: 2020-03-10 — End: 2020-03-10
  Administered 2020-03-10: 80 mg via INTRAMUSCULAR

## 2020-03-10 MED ORDER — TIZANIDINE HCL 4 MG PO CAPS: 4.0000 mg | ORAL_CAPSULE | Freq: Three times a day (TID) | ORAL | 0 refills | Status: DC | PRN

## 2020-03-10 NOTE — Progress Notes (Signed)
Assessment & Plan:  1. Acute bilateral thoracic back pain - methylPREDNISolone acetate (DEPO-MEDROL) injection 80 mg - predniSONE (STERAPRED UNI-PAK 21 TAB) 10 MG (21) TBPK tablet; As directed x 6 days  Dispense: 21 tablet; Refill: 0 - tiZANidine (ZANAFLEX) 4 MG capsule; Take 1 capsule (4 mg total) by mouth 3 (three) times daily as needed for muscle spasms.  Dispense: 30 capsule; Refill: 0  2. Hyperlipidemia - Patient has quit taking statin due to muscle pains. Advised to add CoQ10 to help with this and resume statin.    Follow up plan: Return if symptoms worsen or fail to improve.  Matthew Boston, [name], [name], [name] [facility]  Subjective:   Patient ID: [name], male    DOB: 11-30-1955, 64 y.o.   MRN: [id number]  HPI: [name] is a 64 y.o. male presenting on 03/10/2020 for Back Pain (Patient states he has been having mid back pain x 1 month.)  Back Pain: Patient presents for presents evaluation of back problems.  Symptoms have been present for 1 month and include pain in thoracic spine (shooting and stabbing in character; 8/10 in severity). Initial inciting event: ripping 2x6s in half and putting them on a shed he has been working on. Symptoms are worst: as the day progresses. Alleviating factors identifiable by patient are heating pad. Exacerbating factors identifiable by patient are activity. Treatments so far initiated by patient: Baclofen.    ROS: Negative unless specifically indicated above in HPI.   Relevant past medical history reviewed and updated as indicated.   Allergies and medications reviewed and updated.   Current Outpatient Medications:  .  Accu-Chek FastClix Lancets MISC, Test up to 4 times daily Dx E11.9, Disp: 408 each, Rfl: 3 .  acetaminophen (TYLENOL) 325 MG tablet, Take 650 mg by mouth every 6 (six) hours as needed for moderate pain or headache. , Disp: , Rfl:  .  aspirin 81 MG tablet, Take 1 tablet (81 mg total) by mouth  daily. (Patient taking differently: Take 81 mg by mouth every evening. ), Disp: , Rfl:  .  Calcium Carbonate-Vitamin D (CALCIUM-VITAMIN D) 500-200 MG-UNIT per tablet, Take 1 tablet by mouth every evening. , Disp: , Rfl:  .  cholecalciferol (VITAMIN D) 1000 units tablet, Take 1,000 Units by mouth every evening. , Disp: , Rfl:  .  clotrimazole-betamethasone (LOTRISONE) cream, APPLY TO AFFECTED AREA TWICE A DAY, Disp: 30 g, Rfl: 2 .  glucose blood (ACCU-CHEK GUIDE) test strip, USE UP TO 4 TIMES DAILYTO CHECK BLOOD SUGAR Dx E11.9, Disp: 400 strip, Rfl: 3 .  lisinopril (ZESTRIL) 5 MG tablet, TAKE 1 TABLET BY MOUTH EVERY DAY, Disp: 90 tablet, Rfl: 2 .  metFORMIN (GLUCOPHAGE) 1000 MG tablet, Take 1,000 mg by mouth 2 (two) times daily., Disp: , Rfl:  .  metoprolol succinate (TOPROL-XL) 50 MG 24 hr tablet, TAKE 1 TABLET BY MOUTH DAILY IN THE EVENING. TAKE WITH OR IMMEDIATELY FOLLOWING A MEAL., Disp: 30 tablet, Rfl: 6 .  Omega-3 Fatty Acids (FISH OIL) 1000 MG CAPS, Take 2 capsules (2,000 mg total) by mouth daily. (Patient taking differently: Take 2 capsules by mouth every evening. ), Disp: , Rfl: 0 .  omeprazole (PRILOSEC) 40 MG capsule, TAKE 1 CAPSULE BY MOUTH EVERY DAY, Disp: 90 capsule, Rfl: 1 .  ondansetron (ZOFRAN) 8 MG tablet, Take 1 tablet (8 mg total) by mouth every 8 (eight) hours as needed for nausea or vomiting., Disp: 20 tablet, Rfl: 2 .  prasugrel (  EFFIENT) 10 MG TABS tablet, Take 1 tablet (10 mg total) by mouth daily., Disp: 90 tablet, Rfl: 3 .  rosuvastatin (CRESTOR) 40 MG tablet, TAKE 1 TABLET BY MOUTH EVERY DAY, Disp: 90 tablet, Rfl: 2 .  valACYclovir (VALTREX) 500 MG tablet, Take 1 tablet (500 mg total) by mouth daily., Disp: 90 tablet, Rfl: 0  No Known Allergies  Objective:   BP 128/83   Pulse 70   Temp (!) 95.3 F (35.2 C) (Temporal)   Ht 5\' 10"  (1.778 m)   Wt (!) 260 lb (117.9 kg)   SpO2 94%   BMI 37.31 kg/m    Physical Exam Vitals reviewed.  Constitutional:      General:  He is not in acute distress.    Appearance: Normal appearance. He is obese. He is not ill-appearing, toxic-appearing or diaphoretic.  HENT:     Head: Normocephalic and atraumatic.  Eyes:     General: No scleral icterus.       Right eye: No discharge.        Left eye: No discharge.     Conjunctiva/sclera: Conjunctivae normal.  Cardiovascular:     Rate and Rhythm: Normal rate.  Pulmonary:     Effort: Pulmonary effort is normal. No respiratory distress.  Musculoskeletal:        General: Normal range of motion.     Cervical back: Normal range of motion.     Thoracic back: Tenderness present. No swelling, edema, deformity, signs of trauma, lacerations, spasms or bony tenderness. Normal range of motion. No scoliosis.  Skin:    General: Skin is warm and dry.  Neurological:     Mental Status: He is alert and oriented to person, place, and time. Mental status is at baseline.  Psychiatric:        Mood and Affect: Mood normal.        Behavior: Behavior normal.        Thought Content: Thought content normal.        Judgment: Judgment normal.

## 2020-03-10 NOTE — Patient Instructions (Addendum)
CoQ10 with your statin to help with muscleains.

## 2020-03-11 ENCOUNTER — Other Ambulatory Visit: Payer: Self-pay | Admitting: Nurse Practitioner

## 2020-03-11 MED ORDER — OMEPRAZOLE 40 MG PO CPDR: 40.0000 mg | DELAYED_RELEASE_CAPSULE | Freq: Every day | ORAL | 1 refills | Status: DC

## 2020-03-25 ENCOUNTER — Ambulatory Visit: Payer: 59 | Admitting: Family Medicine

## 2020-03-28 ENCOUNTER — Other Ambulatory Visit: Payer: Self-pay | Admitting: Cardiology

## 2020-03-28 MED ORDER — ROSUVASTATIN CALCIUM 40 MG PO TABS: 40.0000 mg | ORAL_TABLET | Freq: Every day | ORAL | 2 refills | Status: DC

## 2020-03-28 NOTE — Telephone Encounter (Signed)
°*  STAT* If patient is at the pharmacy, call can be transferred to refill team.   1. Which medications need to be refilled? (please list name of each medication and dose if known) rosuvastatin (CRESTOR) 40 MG tablet  2. Which pharmacy/location (including street and city if local pharmacy) is medication to be sent to? [id number] - [place], [place] - [address]  3. Do they need a 30 day or 90 day supply? 90 day   Patient is out of medication.

## 2020-03-28 NOTE — Telephone Encounter (Signed)
Rx(s) sent to pharmacy electronically.  

## 2020-04-26 ENCOUNTER — Encounter: Payer: Self-pay | Admitting: Family Medicine

## 2020-04-26 ENCOUNTER — Other Ambulatory Visit: Payer: Self-pay

## 2020-04-26 ENCOUNTER — Ambulatory Visit (INDEPENDENT_AMBULATORY_CARE_PROVIDER_SITE_OTHER): Payer: 59 | Admitting: Family Medicine

## 2020-04-26 VITALS — BP 119/71 | HR 61 | Temp 97.1°F | Ht 70.0 in | Wt 258.2 lb

## 2020-04-26 DIAGNOSIS — J302 Other seasonal allergic rhinitis: Secondary | ICD-10-CM | POA: Diagnosis not present

## 2020-04-26 DIAGNOSIS — I2510 Atherosclerotic heart disease of native coronary artery without angina pectoris: Secondary | ICD-10-CM

## 2020-04-26 DIAGNOSIS — E785 Hyperlipidemia, unspecified: Secondary | ICD-10-CM

## 2020-04-26 DIAGNOSIS — E1159 Type 2 diabetes mellitus with other circulatory complications: Secondary | ICD-10-CM | POA: Diagnosis not present

## 2020-04-26 DIAGNOSIS — I10 Essential (primary) hypertension: Secondary | ICD-10-CM

## 2020-04-26 DIAGNOSIS — F439 Reaction to severe stress, unspecified: Secondary | ICD-10-CM | POA: Diagnosis not present

## 2020-04-26 DIAGNOSIS — E119 Type 2 diabetes mellitus without complications: Secondary | ICD-10-CM | POA: Diagnosis not present

## 2020-04-26 DIAGNOSIS — E1169 Type 2 diabetes mellitus with other specified complication: Secondary | ICD-10-CM

## 2020-04-26 LAB — BAYER DCA HB A1C WAIVED: HB A1C (BAYER DCA - WAIVED): 7.3 % — ABNORMAL HIGH (ref <7.0)

## 2020-04-26 MED ORDER — DAPAGLIFLOZIN PROPANEDIOL 5 MG PO TABS: 5.0000 mg | ORAL_TABLET | Freq: Every day | ORAL | 2 refills | Status: DC

## 2020-04-26 NOTE — Patient Instructions (Signed)
Claritin, Allegra, Zyrtec, or Xyzal for your allergies (buy the generic)  Arelia Sneddon for dementia.
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
## 2020-04-27 LAB — CMP14+EGFR
ALT: 39 IU/L (ref 0–44)
AST: 38 IU/L (ref 0–40)
Albumin/Globulin Ratio: 2 (ref 1.2–2.2)
Albumin: 4.3 g/dL (ref 3.8–4.8)
Alkaline Phosphatase: 88 IU/L (ref 44–121)
BUN/Creatinine Ratio: 21 (ref 10–24)
BUN: 16 mg/dL (ref 8–27)
Bilirubin Total: 0.4 mg/dL (ref 0.0–1.2)
CO2: 22 mmol/L (ref 20–29)
Calcium: 9.9 mg/dL (ref 8.6–10.2)
Chloride: 102 mmol/L (ref 96–106)
Creatinine, Ser: 0.78 mg/dL (ref 0.76–1.27)
GFR calc Af Amer: 111 mL/min/{1.73_m2} (ref >59)
GFR calc non Af Amer: 96 mL/min/{1.73_m2} (ref >59)
Globulin, Total: 2.2 g/dL (ref 1.5–4.5)
Glucose: 137 mg/dL — ABNORMAL HIGH (ref 65–99)
Potassium: 4.6 mmol/L (ref 3.5–5.2)
Sodium: 140 mmol/L (ref 134–144)
Total Protein: 6.5 g/dL (ref 6.0–8.5)

## 2020-04-27 LAB — LIPID PANEL
Chol/HDL Ratio: 2.7 ratio (ref 0.0–5.0)
Cholesterol, Total: 103 mg/dL (ref 100–199)
HDL: 38 mg/dL — ABNORMAL LOW (ref >39)
LDL Chol Calc (NIH): 37 mg/dL (ref 0–99)
Triglycerides: 170 mg/dL — ABNORMAL HIGH (ref 0–149)
VLDL Cholesterol Cal: 28 mg/dL (ref 5–40)

## 2020-04-27 LAB — CBC WITH DIFFERENTIAL/PLATELET
Basophils Absolute: 0.1 10*3/uL (ref 0.0–0.2)
Basos: 1 %
EOS (ABSOLUTE): 0.3 10*3/uL (ref 0.0–0.4)
Eos: 2 %
Hematocrit: 47.2 % (ref 37.5–51.0)
Hemoglobin: 17 g/dL (ref 13.0–17.7)
Immature Grans (Abs): 0.1 10*3/uL (ref 0.0–0.1)
Immature Granulocytes: 1 %
Lymphocytes Absolute: 7.5 10*3/uL — ABNORMAL HIGH (ref 0.7–3.1)
Lymphs: 52 %
MCH: 35.1 pg — ABNORMAL HIGH (ref 26.6–33.0)
MCHC: 36 g/dL — ABNORMAL HIGH (ref 31.5–35.7)
MCV: 97 fL (ref 79–97)
Monocytes Absolute: 1.4 10*3/uL — ABNORMAL HIGH (ref 0.1–0.9)
Monocytes: 10 %
Neutrophils Absolute: 4.9 10*3/uL (ref 1.4–7.0)
Neutrophils: 34 %
Platelets: 237 10*3/uL (ref 150–450)
RBC: 4.85 x10E6/uL (ref 4.14–5.80)
RDW: 13.3 % (ref 11.6–15.4)
WBC: 14.4 10*3/uL — ABNORMAL HIGH (ref 3.4–10.8)

## 2020-04-28 ENCOUNTER — Telehealth: Payer: Self-pay | Admitting: Family Medicine

## 2020-04-28 NOTE — Telephone Encounter (Signed)
What side effects is he worried about? We need something to get his A1c down.

## 2020-04-28 NOTE — Telephone Encounter (Signed)
Patient states that he already has balanitis and states that a side effect of farxiga is balanitis.  He does not want his balanitis to get worse. Please advise

## 2020-04-28 NOTE — Telephone Encounter (Signed)
Is he agreeable to a once weekly injection? If so, he can schedule an appointment with Raynelle Fanning to discuss, for a sample, and instructions on use.

## 2020-04-29 NOTE — Telephone Encounter (Signed)
Patient aware and agreeable appt with Raynelle Fanning 09/21. FYI

## 2020-05-02 ENCOUNTER — Other Ambulatory Visit: Payer: Self-pay | Admitting: Family Medicine

## 2020-05-02 DIAGNOSIS — D72820 Lymphocytosis (symptomatic): Secondary | ICD-10-CM

## 2020-05-03 ENCOUNTER — Ambulatory Visit (INDEPENDENT_AMBULATORY_CARE_PROVIDER_SITE_OTHER): Payer: 59 | Admitting: Pharmacist

## 2020-05-03 ENCOUNTER — Other Ambulatory Visit: Payer: Self-pay

## 2020-05-03 DIAGNOSIS — E119 Type 2 diabetes mellitus without complications: Secondary | ICD-10-CM | POA: Diagnosis not present

## 2020-05-03 MED ORDER — SITAGLIPTIN PHOSPHATE 100 MG PO TABS: 100.0000 mg | ORAL_TABLET | Freq: Every day | ORAL | 3 refills | Status: DC

## 2020-05-03 NOTE — Progress Notes (Signed)
    05/03/2020 Name: [name] MRN: [id number] DOB: 05-Dec-1955   S:  71 yoM Presents for diabetes evaluation, education, and management Patient was referred and last seen by Primary Care Provider on 04/26/20.  Insurance coverage/medication affordability: [employer]  Patient reports adherence with medications. . Current diabetes medications include: metformin, farxiga (not taking) . Current hypertension medications include: lisinopril, metoprolol Goal 130/80 . Current hyperlipidemia medications include: rosuvastatin  Patient denies hypoglycemic events.   Patient reported dietary habits: Eats 2-3 meals/day The patient is asked to make an attempt to improve diet and exercise patterns to aid in medical management of this problem.  Discussed meal planning options and Plate method for healthy eating . Avoid sugary drinks and desserts . Incorporate balanced protein, non starchy veggies, 1 serving of carbohydrate with each meal . Increase water intake . Increase physical activity as able  Drinks diet mt dew, water Enjoys sweet tea  Patient-reported exercise habits: n/a  Patient reports nocturia (nighttime urination).  Patient reports neuropathy (nerve pain). Once in awhile in feet when   Patient denies visual changes.  Patient reports self foot exams.   O:  Lab Results  Component Value Date   HGBA1C 7.3 (H) 04/26/2020   Lipid Panel     Component Value Date/Time   CHOL 103 04/26/2020 0847   CHOL 122 05/11/2019 0841   TRIG 170 (H) 04/26/2020 0847   TRIG 165 (H) 05/11/2019 0841   HDL 38 (L) 04/26/2020 0847   HDL CANCELED 03/21/2016 1037   HDL 41 01/19/2013 0819   CHOLHDL 2.7 04/26/2020 0847   CHOLHDL 3.6 09/04/2010 0631   VLDL 68 (H) 09/04/2010 0631   LDLCALC 37 04/26/2020 0847   LDLCALC 34 01/19/2013 0819   LDLDIRECT 95 03/23/2015 0958     Home fasting blood sugars: not checking  2 hour post-meal/random blood sugars: n/a.    A/P:  Diabetes T2DM, A1c 7.3%.   Patient was recently prescribed a new medication (Farxiga), however he has not started this due to potential side effects.    -Continue metformin 1g twice daily  -Start [place] 100mg  daily  copay card given & activated for patient use  -Discontinued SGLT2-I Farxiga--patient would not like to take as he has had issues with balanits; he does not want to risk additional side effects  -Discussed GLP1 for future use  -Extensively discussed pathophysiology of diabetes, recommended lifestyle interventions, dietary effects on blood sugar control  -Counseled on s/sx of and management of hypoglycemia  -Next A1C anticipated 3 months.  Written patient instructions provided.  Total time in face to face counseling 30 minutes.    , PharmD, BCPS Clinical Pharmacist, [facility]  II Phone [phone]

## 2020-05-12 DIAGNOSIS — D751 Secondary polycythemia: Secondary | ICD-10-CM | POA: Insufficient documentation

## 2020-05-12 DIAGNOSIS — G4733 Obstructive sleep apnea (adult) (pediatric): Secondary | ICD-10-CM | POA: Insufficient documentation

## 2020-05-12 DIAGNOSIS — D72820 Lymphocytosis (symptomatic): Secondary | ICD-10-CM | POA: Insufficient documentation

## 2020-05-12 DIAGNOSIS — Z77090 Contact with and (suspected) exposure to asbestos: Secondary | ICD-10-CM | POA: Insufficient documentation

## 2020-06-02 ENCOUNTER — Other Ambulatory Visit: Payer: Self-pay | Admitting: Family Medicine

## 2020-06-02 DIAGNOSIS — E119 Type 2 diabetes mellitus without complications: Secondary | ICD-10-CM

## 2020-06-07 ENCOUNTER — Other Ambulatory Visit: Payer: Self-pay | Admitting: Family Medicine

## 2020-06-09 ENCOUNTER — Other Ambulatory Visit: Payer: Self-pay | Admitting: Cardiology

## 2020-06-20 ENCOUNTER — Other Ambulatory Visit: Payer: Self-pay | Admitting: Family Medicine

## 2020-06-20 ENCOUNTER — Other Ambulatory Visit: Payer: Self-pay | Admitting: Cardiology

## 2020-07-04 ENCOUNTER — Other Ambulatory Visit: Payer: Self-pay | Admitting: Family Medicine

## 2020-07-04 ENCOUNTER — Other Ambulatory Visit: Payer: Self-pay | Admitting: Cardiology

## 2020-07-27 ENCOUNTER — Other Ambulatory Visit: Payer: Self-pay

## 2020-07-27 ENCOUNTER — Encounter: Payer: Self-pay | Admitting: Family Medicine

## 2020-07-27 ENCOUNTER — Ambulatory Visit (INDEPENDENT_AMBULATORY_CARE_PROVIDER_SITE_OTHER): Payer: 59 | Admitting: Family Medicine

## 2020-07-27 VITALS — BP 109/71 | HR 64 | Temp 97.3°F | Ht 70.0 in | Wt 263.4 lb

## 2020-07-27 DIAGNOSIS — E1169 Type 2 diabetes mellitus with other specified complication: Secondary | ICD-10-CM

## 2020-07-27 DIAGNOSIS — G4730 Sleep apnea, unspecified: Secondary | ICD-10-CM

## 2020-07-27 DIAGNOSIS — I152 Hypertension secondary to endocrine disorders: Secondary | ICD-10-CM

## 2020-07-27 DIAGNOSIS — E1159 Type 2 diabetes mellitus with other circulatory complications: Secondary | ICD-10-CM

## 2020-07-27 DIAGNOSIS — E119 Type 2 diabetes mellitus without complications: Secondary | ICD-10-CM

## 2020-07-27 DIAGNOSIS — E785 Hyperlipidemia, unspecified: Secondary | ICD-10-CM

## 2020-07-27 DIAGNOSIS — E559 Vitamin D deficiency, unspecified: Secondary | ICD-10-CM

## 2020-07-27 DIAGNOSIS — D72820 Lymphocytosis (symptomatic): Secondary | ICD-10-CM

## 2020-07-27 LAB — BAYER DCA HB A1C WAIVED: HB A1C (BAYER DCA - WAIVED): 6.7 % (ref <7.0)

## 2020-07-27 MED ORDER — SITAGLIPTIN PHOSPHATE 100 MG PO TABS: 100.0000 mg | ORAL_TABLET | Freq: Every day | ORAL | 2 refills | Status: DC

## 2020-07-27 NOTE — Progress Notes (Signed)
Subjective:  Patient ID: [name], male    DOB: 1956-03-24  Age: 64 y.o. MRN: [id number]  CC: Diabetes   HPI [name] presents for Follow-up of medical management of chronic illnesses. .   1. DM Compliant with meds - Yes, metformin and januvia Checking CBGs? Yes  Fasting avg -  140  Exercising regularly? - tries to stay active Watching carbohydrate intake? - Yes Neuropathy ? - reports occasional numbness or tingling in his feet  Pertinent ROS:  Polyuria - No Polydipsia - No Vision problems - no has eye exam in Feb  2. HTN Complaint with meds - Yes Current Medications - lisinopril Watching Salt intake - Yes Pertinent ROS:  Headache - No Fatigue - No Visual Disturbances - No Chest pain - No Dyspnea - No Palpitations - No LE edema - No  3. Lymphocytosis He saw a hematologist. Was told that this was due to his sleep apnea and not an issue with his bone marrow. He had a sleep study done a few years ago but does not have CPAP at home. He has a follow up appointment in January with hematology.   Family, social, and smoking history reviewed.   Medications as noted below. Taking them regularly without complication/adverse reaction being reported today.   History [name] has a past medical history of Allergy, Coronary artery disease, Diabetes ([name]), GERD (gastroesophageal reflux disease), Hyperlipidemia, Hypertension, Kidney stones, Low serum vitamin D, Myocardial infarction ([name]) (12/97,1999,2002,2003,2012), and Oral herpes simplex infection.   He has a past surgical history that includes Splenectomy; Tonsillectomy and adenoidectomy; stents; Leg wound repair / closure (Left); ear (Right, 1966); LEFT HEART CATH AND CORONARY ANGIOGRAPHY (N/A, 08/16/2017); CORONARY STENT INTERVENTION (N/A, 08/19/2017); CORONARY ANGIOGRAPHY (N/A, 08/19/2017); and CORONARY BALLOON ANGIOPLASTY (N/A, 08/19/2017).   His family history includes Coronary artery disease (age of onset: 85) in his  father; Coronary artery disease (age of onset: 5) in his paternal grandfather; Diabetes in his father; Heart attack in his brother.He reports that he quit smoking about 9 years ago. He has never used smokeless tobacco. He reports current alcohol use. He reports that he does not use drugs.  Current Outpatient Medications on File Prior to Visit  Medication Sig Dispense Refill  . Accu-Chek FastClix Lancets MISC Test up to 4 times daily Dx E11.9 408 each 3  . acetaminophen (TYLENOL) 325 MG tablet Take 650 mg by mouth every 6 (six) hours as needed for moderate pain or headache.    Marland Kitchen aspirin 81 MG tablet Take 1 tablet (81 mg total) by mouth daily. (Patient taking differently: Take 81 mg by mouth every evening.)    . Calcium Carbonate-Vitamin D (CALCIUM-VITAMIN D) 500-200 MG-UNIT per tablet Take 1 tablet by mouth every evening.     . cholecalciferol (VITAMIN D) 1000 units tablet Take 1,000 Units by mouth every evening.     . clotrimazole-betamethasone (LOTRISONE) cream APPLY TO AFFECTED AREA TWICE A DAY 30 g 2  . glucose blood (ACCU-CHEK GUIDE) test strip USE UP TO 4 TIMES DAILYTO CHECK BLOOD SUGAR Dx E11.9 400 strip 3  . lisinopril (ZESTRIL) 5 MG tablet TAKE 1 TABLET BY MOUTH EVERY DAY 90 tablet 2  . metFORMIN (GLUCOPHAGE) 1000 MG tablet TAKE 1 TABLET (1,000 MG TOTAL) BY MOUTH 2 (TWO) TIMES DAILY WITH A MEAL. 180 tablet 3  . metoprolol succinate (TOPROL-XL) 50 MG 24 hr tablet TAKE 1 TABLET BY MOUTH DAILY IN THE EVENING. TAKE WITH OR IMMEDIATELY FOLLOWING A MEAL. 90 tablet  2  . Omega-3 Fatty Acids (FISH OIL) 1000 MG CAPS Take 2 capsules (2,000 mg total) by mouth daily. (Patient taking differently: Take 2 capsules by mouth every evening.)  0  . omeprazole (PRILOSEC) 40 MG capsule TAKE 1 CAPSULE BY MOUTH EVERY DAY 90 capsule 0  . ondansetron (ZOFRAN) 8 MG tablet Take 1 tablet (8 mg total) by mouth every 8 (eight) hours as needed for nausea or vomiting. 20 tablet 2  . prasugrel (EFFIENT) 10 MG TABS tablet  Take 1 tablet (10 mg total) by mouth daily. 90 tablet 3  . rosuvastatin (CRESTOR) 40 MG tablet Take 1 tablet (40 mg total) by mouth daily. 90 tablet 2  . sitaGLIPtin (JANUVIA) 100 MG tablet Take 1 tablet (100 mg total) by mouth daily. 30 tablet 3  . valACYclovir (VALTREX) 500 MG tablet TAKE 1 TABLET BY MOUTH EVERY DAY 90 tablet 0   No current facility-administered medications on file prior to visit.    ROS Review of Systems Negative unless indicated above in HPI.  Objective:  BP 109/71   Pulse 64   Temp (!) 97.3 F (36.3 C) (Temporal)   Ht 5' 10" (1.778 m)   Wt 263 lb 6 oz (119.5 kg)   BMI 37.79 kg/m   BP Readings from Last 3 Encounters:  07/27/20 109/71  04/26/20 119/71  03/10/20 128/83    Wt Readings from Last 3 Encounters:  07/27/20 263 lb 6 oz (119.5 kg)  04/26/20 258 lb 3.2 oz (117.1 kg)  03/10/20 (!) 260 lb (117.9 kg)    Physical Exam Vitals and nursing note reviewed.  Constitutional:      Appearance: Normal appearance.  HENT:     Head: Normocephalic and atraumatic.  Eyes:     Extraocular Movements: Extraocular movements intact.     Conjunctiva/sclera: Conjunctivae normal.     Pupils: Pupils are equal, round, and reactive to light.  Cardiovascular:     Rate and Rhythm: Normal rate and regular rhythm.     Heart sounds: Normal heart sounds. No murmur heard.   Pulmonary:     Effort: Pulmonary effort is normal. No respiratory distress.     Breath sounds: Normal breath sounds.  Abdominal:     General: Bowel sounds are normal. There is no distension.     Palpations: Abdomen is soft.     Tenderness: There is no abdominal tenderness.  Musculoskeletal:     Cervical back: Normal range of motion and neck supple. No rigidity.     Right lower leg: No edema.     Left lower leg: No edema.  Skin:    General: Skin is warm and dry.  Neurological:     General: No focal deficit present.     Mental Status: He is alert and oriented to person, place, and time.   Psychiatric:        Mood and Affect: Mood normal.        Behavior: Behavior normal.    Diabetic Foot Exam - Simple   Simple Foot Form Diabetic Foot exam was performed with the following findings: Yes 07/27/2020  8:15 AM  Visual Inspection No deformities, no ulcerations, no other skin breakdown bilaterally: Yes Sensation Testing Intact to touch and monofilament testing bilaterally: Yes Pulse Check Posterior Tibialis and Dorsalis pulse intact bilaterally: Yes Comments      Lab Results  Component Value Date   HGBA1C 7.3 (H) 04/26/2020   HGBA1C 7.1 (H) 12/24/2019   HGBA1C 7.8 (H) 09/16/2019  Lab Results  Component Value Date   WBC 14.4 (H) 04/26/2020   HGB 17.0 04/26/2020   HCT 47.2 04/26/2020   PLT 237 04/26/2020   GLUCOSE 137 (H) 04/26/2020   CHOL 103 04/26/2020   TRIG 170 (H) 04/26/2020   HDL 38 (L) 04/26/2020   LDLDIRECT 95 03/23/2015   LDLCALC 37 04/26/2020   ALT 39 04/26/2020   AST 38 04/26/2020   NA 140 04/26/2020   K 4.6 04/26/2020   CL 102 04/26/2020   CREATININE 0.78 04/26/2020   BUN 16 04/26/2020   CO2 22 04/26/2020   TSH 0.824 03/09/2019   PSA 0.7 12/21/2013   INR 1.1 08/14/2017   HGBA1C 7.3 (H) 04/26/2020     Assessment & Plan:  Latravis was seen today for diabetes.  Diagnoses and all orders for this visit:  Type 2 diabetes mellitus without complication, without long-term current use of insulin (HCC) A1C 6.7 today. Well controlled on current regimen. Labs pending.  -     Bayer DCA Hb A1c Waived -     CMP14+EGFR -     CBC with Differential/Platelet -     Lipid panel -     sitaGLIPtin (JANUVIA) 100 MG tablet; Take 1 tablet (100 mg total) by mouth daily.  Hypertension associated with type 2 diabetes mellitus ([place]) Well controlled on current regimen. Labs pending.  -     CMP14+EGFR -     CBC with Differential/Platelet -     Lipid panel -     TSH  Hyperlipidemia associated with type 2 diabetes mellitus ([place]) Well controlled on current  regimen. Labs pending.  -     Lipid panel  Morbid obesity ([place]) Discussed nutrition and exercise.   Sleep apnea, unspecified type -     Ambulatory referral to Sleep Studies  Vitamin D deficiency Taking supplement.  Lymphocytosis Follow up with hematology as directed.    Follow-up: 3 months with PCP for chronic follow up   The above assessment and management plan was discussed with the patient. The patient verbalized understanding of and has agreed to the management plan. Patient is aware to call the clinic if symptoms fail to improve or worsen. Patient is aware when to return to the clinic for a follow-up visit. Patient educated on when it is appropriate to go to the emergency department.   Marjorie Smolder, FNP-C [facility] [address] [place], [place] [address] [phone]

## 2020-07-27 NOTE — Patient Instructions (Signed)
Diabetes Mellitus and Nutrition, Adult When you have diabetes (diabetes mellitus), it is very important to have healthy eating habits because your blood sugar (glucose) levels are greatly affected by what you eat and drink. Eating healthy foods in the appropriate amounts, at about the same times every day, can help you:  Control your blood glucose.  Lower your risk of heart disease.  Improve your blood pressure.  Reach or maintain a healthy weight. Every person with diabetes is different, and each person has different needs for a meal plan. Your health care provider may recommend that you work with a diet and nutrition specialist (dietitian) to make a meal plan that is best for you. Your meal plan may vary depending on factors such as:  The calories you need.  The medicines you take.  Your weight.  Your blood glucose, blood pressure, and cholesterol levels.  Your activity level.  Other health conditions you have, such as heart or kidney disease. How do carbohydrates affect me? Carbohydrates, also called carbs, affect your blood glucose level more than any other type of food. Eating carbs naturally raises the amount of glucose in your blood. Carb counting is a method for keeping track of how many carbs you eat. Counting carbs is important to keep your blood glucose at a healthy level, especially if you use insulin or take certain oral diabetes medicines. It is important to know how many carbs you can safely have in each meal. This is different for every person. Your dietitian can help you calculate how many carbs you should have at each meal and for each snack. Foods that contain carbs include:  Bread, cereal, rice, pasta, and crackers.  Potatoes and corn.  Peas, beans, and lentils.  Milk and yogurt.  Fruit and juice.  Desserts, such as cakes, cookies, ice cream, and candy. How does alcohol affect me? Alcohol can cause a sudden decrease in blood glucose (hypoglycemia),  especially if you use insulin or take certain oral diabetes medicines. Hypoglycemia can be a life-threatening condition. Symptoms of hypoglycemia (sleepiness, dizziness, and confusion) are similar to symptoms of having too much alcohol. If your health care provider says that alcohol is safe for you, follow these guidelines:  Limit alcohol intake to no more than 1 drink per day for nonpregnant women and 2 drinks per day for men. One drink equals 12 oz of beer, 5 oz of wine, or 1 oz of hard liquor.  Do not drink on an empty stomach.  Keep yourself hydrated with water, diet soda, or unsweetened iced tea.  Keep in mind that regular soda, juice, and other mixers may contain a lot of sugar and must be counted as carbs. What are tips for following this plan?  Reading food labels  Start by checking the serving size on the "Nutrition Facts" label of packaged foods and drinks. The amount of calories, carbs, fats, and other nutrients listed on the label is based on one serving of the item. Many items contain more than one serving per package.  Check the total grams (g) of carbs in one serving. You can calculate the number of servings of carbs in one serving by dividing the total carbs by 15. For example, if a food has 30 g of total carbs, it would be equal to 2 servings of carbs.  Check the number of grams (g) of saturated and trans fats in one serving. Choose foods that have low or no amount of these fats.  Check the number of   milligrams (mg) of salt (sodium) in one serving. Most people should limit total sodium intake to less than 2,300 mg per day.  Always check the nutrition information of foods labeled as "low-fat" or "nonfat". These foods may be higher in added sugar or refined carbs and should be avoided.  Talk to your dietitian to identify your daily goals for nutrients listed on the label. Shopping  Avoid buying canned, premade, or processed foods. These foods tend to be high in fat, sodium,  and added sugar.  Shop around the outside edge of the grocery store. This includes fresh fruits and vegetables, bulk grains, fresh meats, and fresh dairy. Cooking  Use low-heat cooking methods, such as baking, instead of high-heat cooking methods like deep frying.  Cook using healthy oils, such as olive, canola, or sunflower oil.  Avoid cooking with butter, cream, or high-fat meats. Meal planning  Eat meals and snacks regularly, preferably at the same times every day. Avoid going long periods of time without eating.  Eat foods high in fiber, such as fresh fruits, vegetables, beans, and whole grains. Talk to your dietitian about how many servings of carbs you can eat at each meal.  Eat 4-6 ounces (oz) of lean protein each day, such as lean meat, chicken, fish, eggs, or tofu. One oz of lean protein is equal to: ? 1 oz of meat, chicken, or fish. ? 1 egg. ?  cup of tofu.  Eat some foods each day that contain healthy fats, such as avocado, nuts, seeds, and fish. Lifestyle  Check your blood glucose regularly.  Exercise regularly as told by your health care provider. This may include: ? 150 minutes of moderate-intensity or vigorous-intensity exercise each week. This could be brisk walking, biking, or water aerobics. ? Stretching and doing strength exercises, such as yoga or weightlifting, at least 2 times a week.  Take medicines as told by your health care provider.  Do not use any products that contain nicotine or tobacco, such as cigarettes and e-cigarettes. If you need help quitting, ask your health care provider.  Work with a counselor or diabetes educator to identify strategies to manage stress and any emotional and social challenges. Questions to ask a health care provider  Do I need to meet with a diabetes educator?  Do I need to meet with a dietitian?  What number can I call if I have questions?  When are the best times to check my blood glucose? Where to find more  information:  American Diabetes Association: diabetes.org  Academy of Nutrition and Dietetics: www.eatright.org  National Institute of Diabetes and Digestive and Kidney Diseases (NIH): www.niddk.nih.gov Summary  A healthy meal plan will help you control your blood glucose and maintain a healthy lifestyle.  Working with a diet and nutrition specialist (dietitian) can help you make a meal plan that is best for you.  Keep in mind that carbohydrates (carbs) and alcohol have immediate effects on your blood glucose levels. It is important to count carbs and to use alcohol carefully. This information is not intended to replace advice given to you by your health care provider. Make sure you discuss any questions you have with your health care provider. Document Revised: 07/12/2017 Document Reviewed: 09/03/2016 Elsevier Patient Education  2020 Elsevier Inc. Hypertension, Adult High blood pressure (hypertension) is when the force of blood pumping through the arteries is too strong. The arteries are the blood vessels that carry blood from the heart throughout the body. Hypertension forces the heart to   work harder to pump blood and may cause arteries to become narrow or stiff. Untreated or uncontrolled hypertension can cause a heart attack, heart failure, a stroke, kidney disease, and other problems. A blood pressure reading consists of a higher number over a lower number. Ideally, your blood pressure should be below 120/80. The first ("top") number is called the systolic pressure. It is a measure of the pressure in your arteries as your heart beats. The second ("bottom") number is called the diastolic pressure. It is a measure of the pressure in your arteries as the heart relaxes. What are the causes? The exact cause of this condition is not known. There are some conditions that result in or are related to high blood pressure. What increases the risk? Some risk factors for high blood pressure are under  your control. The following factors may make you more likely to develop this condition:  Smoking.  Having type 2 diabetes mellitus, high cholesterol, or both.  Not getting enough exercise or physical activity.  Being overweight.  Having too much fat, sugar, calories, or salt (sodium) in your diet.  Drinking too much alcohol. Some risk factors for high blood pressure may be difficult or impossible to change. Some of these factors include:  Having chronic kidney disease.  Having a family history of high blood pressure.  Age. Risk increases with age.  Race. You may be at higher risk if you are African American.  Gender. Men are at higher risk than women before age 45. After age 65, women are at higher risk than men.  Having obstructive sleep apnea.  Stress. What are the signs or symptoms? High blood pressure may not cause symptoms. Very high blood pressure (hypertensive crisis) may cause:  Headache.  Anxiety.  Shortness of breath.  Nosebleed.  Nausea and vomiting.  Vision changes.  Severe chest pain.  Seizures. How is this diagnosed? This condition is diagnosed by measuring your blood pressure while you are seated, with your arm resting on a flat surface, your legs uncrossed, and your feet flat on the floor. The cuff of the blood pressure monitor will be placed directly against the skin of your upper arm at the level of your heart. It should be measured at least twice using the same arm. Certain conditions can cause a difference in blood pressure between your right and left arms. Certain factors can cause blood pressure readings to be lower or higher than normal for a short period of time:  When your blood pressure is higher when you are in a health care provider's office than when you are at home, this is called white coat hypertension. Most people with this condition do not need medicines.  When your blood pressure is higher at home than when you are in a health  care provider's office, this is called masked hypertension. Most people with this condition may need medicines to control blood pressure. If you have a high blood pressure reading during one visit or you have normal blood pressure with other risk factors, you may be asked to:  Return on a different day to have your blood pressure checked again.  Monitor your blood pressure at home for 1 week or longer. If you are diagnosed with hypertension, you may have other blood or imaging tests to help your health care provider understand your overall risk for other conditions. How is this treated? This condition is treated by making healthy lifestyle changes, such as eating healthy foods, exercising more, and reducing your   alcohol intake. Your health care provider may prescribe medicine if lifestyle changes are not enough to get your blood pressure under control, and if:  Your systolic blood pressure is above 130.  Your diastolic blood pressure is above 80. Your personal target blood pressure may vary depending on your medical conditions, your age, and other factors. Follow these instructions at home: Eating and drinking   Eat a diet that is high in fiber and potassium, and low in sodium, added sugar, and fat. An example eating plan is called the DASH (Dietary Approaches to Stop Hypertension) diet. To eat this way: ? Eat plenty of fresh fruits and vegetables. Try to fill one half of your plate at each meal with fruits and vegetables. ? Eat whole grains, such as whole-wheat pasta, brown rice, or whole-grain bread. Fill about one fourth of your plate with whole grains. ? Eat or drink low-fat dairy products, such as skim milk or low-fat yogurt. ? Avoid fatty cuts of meat, processed or cured meats, and poultry with skin. Fill about one fourth of your plate with lean proteins, such as fish, chicken without skin, beans, eggs, or tofu. ? Avoid pre-made and processed foods. These tend to be higher in sodium,  added sugar, and fat.  Reduce your daily sodium intake. Most people with hypertension should eat less than 1,500 mg of sodium a day.  Do not drink alcohol if: ? Your health care provider tells you not to drink. ? You are pregnant, may be pregnant, or are planning to become pregnant.  If you drink alcohol: ? Limit how much you use to:  0-1 drink a day for women.  0-2 drinks a day for men. ? Be aware of how much alcohol is in your drink. In the [place] one drink equals one 12 oz bottle of beer (355 mL), one 5 oz glass of wine (148 mL), or one 1 oz glass of hard liquor (44 mL). Lifestyle   Work with your health care provider to maintain a healthy body weight or to lose weight. Ask what an ideal weight is for you.  Get at least 30 minutes of exercise most days of the week. Activities may include walking, swimming, or biking.  Include exercise to strengthen your muscles (resistance exercise), such as Pilates or lifting weights, as part of your weekly exercise routine. Try to do these types of exercises for 30 minutes at least 3 days a week.  Do not use any products that contain nicotine or tobacco, such as cigarettes, e-cigarettes, and chewing tobacco. If you need help quitting, ask your health care provider.  Monitor your blood pressure at home as told by your health care provider.  Keep all follow-up visits as told by your health care provider. This is important. Medicines  Take over-the-counter and prescription medicines only as told by your health care provider. Follow directions carefully. Blood pressure medicines must be taken as prescribed.  Do not skip doses of blood pressure medicine. Doing this puts you at risk for problems and can make the medicine less effective.  Ask your health care provider about side effects or reactions to medicines that you should watch for. Contact a health care provider if you:  Think you are having a reaction to a medicine you are taking.  Have  headaches that keep coming back (recurring).  Feel dizzy.  Have swelling in your ankles.  Have trouble with your vision. Get help right away if you:  Develop a severe headache or confusion.    Have unusual weakness or numbness.  Feel faint.  Have severe pain in your chest or abdomen.  Vomit repeatedly.  Have trouble breathing. Summary  Hypertension is when the force of blood pumping through your arteries is too strong. If this condition is not controlled, it may put you at risk for serious complications.  Your personal target blood pressure may vary depending on your medical conditions, your age, and other factors. For most people, a normal blood pressure is less than 120/80.  Hypertension is treated with lifestyle changes, medicines, or a combination of both. Lifestyle changes include losing weight, eating a healthy, low-sodium diet, exercising more, and limiting alcohol. This information is not intended to replace advice given to you by your health care provider. Make sure you discuss any questions you have with your health care provider. Document Revised: 04/09/2018 Document Reviewed: 04/09/2018 Elsevier Patient Education  2020 Elsevier Inc.  

## 2020-07-28 LAB — CBC WITH DIFFERENTIAL/PLATELET
Basophils Absolute: 0.1 10*3/uL (ref 0.0–0.2)
Basos: 1 %
EOS (ABSOLUTE): 0.2 10*3/uL (ref 0.0–0.4)
Eos: 2 %
Hematocrit: 48.1 % (ref 37.5–51.0)
Hemoglobin: 17.1 g/dL (ref 13.0–17.7)
Immature Grans (Abs): 0.1 10*3/uL (ref 0.0–0.1)
Immature Granulocytes: 1 %
Lymphocytes Absolute: 5.9 10*3/uL — ABNORMAL HIGH (ref 0.7–3.1)
Lymphs: 49 %
MCH: 34 pg — ABNORMAL HIGH (ref 26.6–33.0)
MCHC: 35.6 g/dL (ref 31.5–35.7)
MCV: 96 fL (ref 79–97)
Monocytes Absolute: 1.3 10*3/uL — ABNORMAL HIGH (ref 0.1–0.9)
Monocytes: 11 %
Neutrophils Absolute: 4.3 10*3/uL (ref 1.4–7.0)
Neutrophils: 36 %
Platelets: 222 10*3/uL (ref 150–450)
RBC: 5.03 x10E6/uL (ref 4.14–5.80)
RDW: 12.4 % (ref 11.6–15.4)
WBC: 11.9 10*3/uL — ABNORMAL HIGH (ref 3.4–10.8)

## 2020-07-28 LAB — CMP14+EGFR
ALT: 26 IU/L (ref 0–44)
AST: 41 IU/L — ABNORMAL HIGH (ref 0–40)
Albumin/Globulin Ratio: 2 (ref 1.2–2.2)
Albumin: 4.3 g/dL (ref 3.8–4.8)
Alkaline Phosphatase: 84 IU/L (ref 44–121)
BUN/Creatinine Ratio: 16 (ref 10–24)
BUN: 12 mg/dL (ref 8–27)
Bilirubin Total: 0.4 mg/dL (ref 0.0–1.2)
CO2: 21 mmol/L (ref 20–29)
Calcium: 9.1 mg/dL (ref 8.6–10.2)
Chloride: 105 mmol/L (ref 96–106)
Creatinine, Ser: 0.77 mg/dL (ref 0.76–1.27)
GFR calc Af Amer: 112 mL/min/{1.73_m2} (ref >59)
GFR calc non Af Amer: 97 mL/min/{1.73_m2} (ref >59)
Globulin, Total: 2.2 g/dL (ref 1.5–4.5)
Glucose: 153 mg/dL — ABNORMAL HIGH (ref 65–99)
Potassium: 4.3 mmol/L (ref 3.5–5.2)
Sodium: 143 mmol/L (ref 134–144)
Total Protein: 6.5 g/dL (ref 6.0–8.5)

## 2020-07-28 LAB — TSH: TSH: 1.51 u[IU]/mL (ref 0.450–4.500)

## 2020-07-28 LAB — LIPID PANEL
Chol/HDL Ratio: 3.2 ratio (ref 0.0–5.0)
Cholesterol, Total: 116 mg/dL (ref 100–199)
HDL: 36 mg/dL — ABNORMAL LOW (ref >39)
LDL Chol Calc (NIH): 55 mg/dL (ref 0–99)
Triglycerides: 141 mg/dL (ref 0–149)
VLDL Cholesterol Cal: 25 mg/dL (ref 5–40)

## 2020-08-02 ENCOUNTER — Telehealth: Payer: Self-pay

## 2020-08-02 NOTE — Telephone Encounter (Signed)
[name] called from [facility] stating that pt has an appt with them this morning at 9AM and they need [place] to fax them pt's most recent sleep study report.  Can fax to [phone]

## 2020-08-02 NOTE — Telephone Encounter (Signed)
Attempted to call [facility] back- [phone]  Spoke with LAT- the need to request sleep study from [facility] sleep [phone]

## 2020-08-04 NOTE — Telephone Encounter (Signed)
LMTCB  No call back - this encounter will be closed.  

## 2020-08-09 ENCOUNTER — Telehealth: Payer: Self-pay | Admitting: Pulmonary Disease

## 2020-08-09 NOTE — Telephone Encounter (Signed)
Last HST faxed to [facility] at fax number in message.  Nothing further is needed.

## 2020-09-14 LAB — HM DIABETES EYE EXAM

## 2020-09-16 ENCOUNTER — Other Ambulatory Visit: Payer: Self-pay | Admitting: Family Medicine

## 2020-09-16 DIAGNOSIS — N481 Balanitis: Secondary | ICD-10-CM

## 2020-10-27 ENCOUNTER — Other Ambulatory Visit: Payer: Self-pay

## 2020-10-27 ENCOUNTER — Ambulatory Visit (INDEPENDENT_AMBULATORY_CARE_PROVIDER_SITE_OTHER): Payer: 59 | Admitting: Family Medicine

## 2020-10-27 ENCOUNTER — Encounter: Payer: Self-pay | Admitting: Family Medicine

## 2020-10-27 VITALS — BP 126/81 | HR 62 | Temp 95.7°F | Ht 70.0 in | Wt 265.4 lb

## 2020-10-27 DIAGNOSIS — E1159 Type 2 diabetes mellitus with other circulatory complications: Secondary | ICD-10-CM | POA: Diagnosis not present

## 2020-10-27 DIAGNOSIS — D751 Secondary polycythemia: Secondary | ICD-10-CM

## 2020-10-27 DIAGNOSIS — E1169 Type 2 diabetes mellitus with other specified complication: Secondary | ICD-10-CM | POA: Diagnosis not present

## 2020-10-27 DIAGNOSIS — Z23 Encounter for immunization: Secondary | ICD-10-CM

## 2020-10-27 DIAGNOSIS — K219 Gastro-esophageal reflux disease without esophagitis: Secondary | ICD-10-CM

## 2020-10-27 DIAGNOSIS — B002 Herpesviral gingivostomatitis and pharyngotonsillitis: Secondary | ICD-10-CM | POA: Diagnosis not present

## 2020-10-27 DIAGNOSIS — G4733 Obstructive sleep apnea (adult) (pediatric): Secondary | ICD-10-CM

## 2020-10-27 DIAGNOSIS — E785 Hyperlipidemia, unspecified: Secondary | ICD-10-CM

## 2020-10-27 DIAGNOSIS — E119 Type 2 diabetes mellitus without complications: Secondary | ICD-10-CM

## 2020-10-27 DIAGNOSIS — I152 Hypertension secondary to endocrine disorders: Secondary | ICD-10-CM

## 2020-10-27 LAB — BAYER DCA HB A1C WAIVED: HB A1C (BAYER DCA - WAIVED): 6.8 % (ref <7.0)

## 2020-10-27 MED ORDER — VALACYCLOVIR HCL 500 MG PO TABS: 500.0000 mg | ORAL_TABLET | Freq: Every day | ORAL | 1 refills | Status: DC

## 2020-10-27 MED ORDER — OMEPRAZOLE 40 MG PO CPDR: DELAYED_RELEASE_CAPSULE | ORAL | 1 refills | Status: DC

## 2020-10-27 NOTE — Patient Instructions (Signed)
Diabetes Mellitus and Nutrition, Adult When you have diabetes, or diabetes mellitus, it is very important to have healthy eating habits because your blood sugar (glucose) levels are greatly affected by what you eat and drink. Eating healthy foods in the right amounts, at about the same times every day, can help you:  Control your blood glucose.  Lower your risk of heart disease.  Improve your blood pressure.  Reach or maintain a healthy weight. What can affect my meal plan? Every person with diabetes is different, and each person has different needs for a meal plan. Your health care provider may recommend that you work with a dietitian to make a meal plan that is best for you. Your meal plan may vary depending on factors such as:  The calories you need.  The medicines you take.  Your weight.  Your blood glucose, blood pressure, and cholesterol levels.  Your activity level.  Other health conditions you have, such as heart or kidney disease. How do carbohydrates affect me? Carbohydrates, also called carbs, affect your blood glucose level more than any other type of food. Eating carbs naturally raises the amount of glucose in your blood. Carb counting is a method for keeping track of how many carbs you eat. Counting carbs is important to keep your blood glucose at a healthy level, especially if you use insulin or take certain oral diabetes medicines. It is important to know how many carbs you can safely have in each meal. This is different for every person. Your dietitian can help you calculate how many carbs you should have at each meal and for each snack. How does alcohol affect me? Alcohol can cause a sudden decrease in blood glucose (hypoglycemia), especially if you use insulin or take certain oral diabetes medicines. Hypoglycemia can be a life-threatening condition. Symptoms of hypoglycemia, such as sleepiness, dizziness, and confusion, are similar to symptoms of having too much  alcohol.  Do not drink alcohol if: ? Your health care provider tells you not to drink. ? You are pregnant, may be pregnant, or are planning to become pregnant.  If you drink alcohol: ? Do not drink on an empty stomach. ? Limit how much you use to:  0-1 drink a day for women.  0-2 drinks a day for men. ? Be aware of how much alcohol is in your drink. In the [place] one drink equals one 12 oz bottle of beer (355 mL), one 5 oz glass of wine (148 mL), or one 1 oz glass of hard liquor (44 mL). ? Keep yourself hydrated with water, diet soda, or unsweetened iced tea.  Keep in mind that regular soda, juice, and other mixers may contain a lot of sugar and must be counted as carbs. What are tips for following this plan? Reading food labels  Start by checking the serving size on the "Nutrition Facts" label of packaged foods and drinks. The amount of calories, carbs, fats, and other nutrients listed on the label is based on one serving of the item. Many items contain more than one serving per package.  Check the total grams (g) of carbs in one serving. You can calculate the number of servings of carbs in one serving by dividing the total carbs by 15. For example, if a food has 30 g of total carbs per serving, it would be equal to 2 servings of carbs.  Check the number of grams (g) of saturated fats and trans fats in one serving. Choose foods that have   a low amount or none of these fats.  Check the number of milligrams (mg) of salt (sodium) in one serving. Most people should limit total sodium intake to less than 2,300 mg per day.  Always check the nutrition information of foods labeled as "low-fat" or "nonfat." These foods may be higher in added sugar or refined carbs and should be avoided.  Talk to your dietitian to identify your daily goals for nutrients listed on the label. Shopping  Avoid buying canned, pre-made, or processed foods. These foods tend to be high in fat, sodium, and added  sugar.  Shop around the outside edge of the grocery store. This is where you will most often find fresh fruits and vegetables, bulk grains, fresh meats, and fresh dairy. Cooking  Use low-heat cooking methods, such as baking, instead of high-heat cooking methods like deep frying.  Cook using healthy oils, such as olive, canola, or sunflower oil.  Avoid cooking with butter, cream, or high-fat meats. Meal planning  Eat meals and snacks regularly, preferably at the same times every day. Avoid going long periods of time without eating.  Eat foods that are high in fiber, such as fresh fruits, vegetables, beans, and whole grains. Talk with your dietitian about how many servings of carbs you can eat at each meal.  Eat 4-6 oz (112-168 g) of lean protein each day, such as lean meat, chicken, fish, eggs, or tofu. One ounce (oz) of lean protein is equal to: ? 1 oz (28 g) of meat, chicken, or fish. ? 1 egg. ?  cup (62 g) of tofu.  Eat some foods each day that contain healthy fats, such as avocado, nuts, seeds, and fish.   What foods should I eat? Fruits Berries. Apples. Oranges. Peaches. Apricots. Plums. Grapes. Mango. Papaya. Pomegranate. Kiwi. Cherries. Vegetables Lettuce. Spinach. Leafy greens, including kale, chard, collard greens, and mustard greens. Beets. Cauliflower. Cabbage. Broccoli. Carrots. Green beans. Tomatoes. Peppers. Onions. Cucumbers. Brussels sprouts. Grains Whole grains, such as whole-wheat or whole-grain bread, crackers, tortillas, cereal, and pasta. Unsweetened oatmeal. Quinoa. Brown or wild rice. Meats and other proteins Seafood. Poultry without skin. Lean cuts of poultry and beef. Tofu. Nuts. Seeds. Dairy Low-fat or fat-free dairy products such as milk, yogurt, and cheese. The items listed above may not be a complete list of foods and beverages you can eat. Contact a dietitian for more information. What foods should I avoid? Fruits Fruits canned with  syrup. Vegetables Canned vegetables. Frozen vegetables with butter or cream sauce. Grains Refined white flour and flour products such as bread, pasta, snack foods, and cereals. Avoid all processed foods. Meats and other proteins Fatty cuts of meat. Poultry with skin. Breaded or fried meats. Processed meat. Avoid saturated fats. Dairy Full-fat yogurt, cheese, or milk. Beverages Sweetened drinks, such as soda or iced tea. The items listed above may not be a complete list of foods and beverages you should avoid. Contact a dietitian for more information. Questions to ask a health care provider  Do I need to meet with a diabetes educator?  Do I need to meet with a dietitian?  What number can I call if I have questions?  When are the best times to check my blood glucose? Where to find more information:  American Diabetes Association: diabetes.org  Academy of Nutrition and Dietetics: www.eatright.org  National Institute of Diabetes and Digestive and Kidney Diseases: www.niddk.nih.gov  Association of Diabetes Care and Education Specialists: www.diabeteseducator.org Summary  It is important to have healthy eating   habits because your blood sugar (glucose) levels are greatly affected by what you eat and drink.  A healthy meal plan will help you control your blood glucose and maintain a healthy lifestyle.  Your health care provider may recommend that you work with a dietitian to make a meal plan that is best for you.  Keep in mind that carbohydrates (carbs) and alcohol have immediate effects on your blood glucose levels. It is important to count carbs and to use alcohol carefully. This information is not intended to replace advice given to you by your health care provider. Make sure you discuss any questions you have with your health care provider. Document Revised: 07/07/2019 Document Reviewed: 07/07/2019 Elsevier Patient Education  2021 Elsevier Inc.  
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
## 2020-10-28 DIAGNOSIS — B002 Herpesviral gingivostomatitis and pharyngotonsillitis: Secondary | ICD-10-CM | POA: Insufficient documentation

## 2020-10-28 LAB — CMP14+EGFR
ALT: 42 IU/L (ref 0–44)
AST: 48 IU/L — ABNORMAL HIGH (ref 0–40)
Albumin/Globulin Ratio: 1.7 (ref 1.2–2.2)
Albumin: 4.2 g/dL (ref 3.8–4.8)
Alkaline Phosphatase: 81 IU/L (ref 44–121)
BUN/Creatinine Ratio: 19 (ref 10–24)
BUN: 13 mg/dL (ref 8–27)
Bilirubin Total: 0.4 mg/dL (ref 0.0–1.2)
CO2: 20 mmol/L (ref 20–29)
Calcium: 9.2 mg/dL (ref 8.6–10.2)
Chloride: 103 mmol/L (ref 96–106)
Creatinine, Ser: 0.7 mg/dL — ABNORMAL LOW (ref 0.76–1.27)
Globulin, Total: 2.5 g/dL (ref 1.5–4.5)
Glucose: 147 mg/dL — ABNORMAL HIGH (ref 65–99)
Potassium: 4.6 mmol/L (ref 3.5–5.2)
Sodium: 143 mmol/L (ref 134–144)
Total Protein: 6.7 g/dL (ref 6.0–8.5)
eGFR: 104 mL/min/{1.73_m2} (ref >59)

## 2020-10-28 LAB — CBC WITH DIFFERENTIAL/PLATELET
Basophils Absolute: 0.1 10*3/uL (ref 0.0–0.2)
Basos: 1 %
EOS (ABSOLUTE): 0.3 10*3/uL (ref 0.0–0.4)
Eos: 3 %
Hematocrit: 49.9 % (ref 37.5–51.0)
Hemoglobin: 16.6 g/dL (ref 13.0–17.7)
Immature Grans (Abs): 0.1 10*3/uL (ref 0.0–0.1)
Immature Granulocytes: 1 %
Lymphocytes Absolute: 5.6 10*3/uL — ABNORMAL HIGH (ref 0.7–3.1)
Lymphs: 49 %
MCH: 32.4 pg (ref 26.6–33.0)
MCHC: 33.3 g/dL (ref 31.5–35.7)
MCV: 98 fL — ABNORMAL HIGH (ref 79–97)
Monocytes Absolute: 1.3 10*3/uL — ABNORMAL HIGH (ref 0.1–0.9)
Monocytes: 11 %
Neutrophils Absolute: 3.9 10*3/uL (ref 1.4–7.0)
Neutrophils: 35 %
Platelets: 237 10*3/uL (ref 150–450)
RBC: 5.12 x10E6/uL (ref 4.14–5.80)
RDW: 13.4 % (ref 11.6–15.4)
WBC: 11.3 10*3/uL — ABNORMAL HIGH (ref 3.4–10.8)

## 2020-10-28 LAB — LIPID PANEL
Chol/HDL Ratio: 3.1 ratio (ref 0.0–5.0)
Cholesterol, Total: 112 mg/dL (ref 100–199)
HDL: 36 mg/dL — ABNORMAL LOW (ref >39)
LDL Chol Calc (NIH): 45 mg/dL (ref 0–99)
Triglycerides: 190 mg/dL — ABNORMAL HIGH (ref 0–149)
VLDL Cholesterol Cal: 31 mg/dL (ref 5–40)

## 2020-11-21 ENCOUNTER — Other Ambulatory Visit: Payer: Self-pay | Admitting: Cardiology

## 2020-12-07 ENCOUNTER — Other Ambulatory Visit: Payer: Self-pay | Admitting: Family Medicine

## 2021-02-19 ENCOUNTER — Other Ambulatory Visit: Payer: Self-pay | Admitting: Cardiology

## 2021-02-20 NOTE — Telephone Encounter (Signed)
Not an anticoagulant drug therefore doesn't come to cvrr team 

## 2021-02-26 ENCOUNTER — Other Ambulatory Visit: Payer: Self-pay | Admitting: Family Medicine

## 2021-02-26 ENCOUNTER — Other Ambulatory Visit: Payer: Self-pay | Admitting: Cardiology

## 2021-02-26 DIAGNOSIS — B002 Herpesviral gingivostomatitis and pharyngotonsillitis: Secondary | ICD-10-CM

## 2021-02-26 DIAGNOSIS — E119 Type 2 diabetes mellitus without complications: Secondary | ICD-10-CM

## 2021-03-01 ENCOUNTER — Ambulatory Visit (INDEPENDENT_AMBULATORY_CARE_PROVIDER_SITE_OTHER): Payer: 59 | Admitting: Nurse Practitioner

## 2021-03-01 ENCOUNTER — Other Ambulatory Visit: Payer: Self-pay

## 2021-03-01 ENCOUNTER — Encounter: Payer: Self-pay | Admitting: Nurse Practitioner

## 2021-03-01 VITALS — BP 128/79 | HR 69 | Temp 98.1°F | Ht 70.0 in | Wt 262.0 lb

## 2021-03-01 DIAGNOSIS — R319 Hematuria, unspecified: Secondary | ICD-10-CM | POA: Diagnosis not present

## 2021-03-01 LAB — MICROSCOPIC EXAMINATION
RBC, Urine: >30 /hpf — AB (ref 0–2)
WBC, UA: NONE SEEN /hpf (ref 0–5)

## 2021-03-01 LAB — URINALYSIS, ROUTINE W REFLEX MICROSCOPIC
Bilirubin, UA: NEGATIVE
Nitrite, UA: NEGATIVE
Specific Gravity, UA: 1.025 (ref 1.005–1.030)
Urobilinogen, Ur: 1 mg/dL (ref 0.2–1.0)
pH, UA: 5.5 (ref 5.0–7.5)

## 2021-03-01 MED ORDER — SULFAMETHOXAZOLE-TRIMETHOPRIM 800-160 MG PO TABS: 1.0000 | ORAL_TABLET | Freq: Two times a day (BID) | ORAL | 0 refills | Status: DC

## 2021-03-01 NOTE — Assessment & Plan Note (Signed)
New symptoms of hematuria.  Completed urinalysis results showing leukocytes, protein, blood few bacteria and cloudy urine.  Started patient on Bactrim DS.  Urine sent to cultures will treat pending results.  No fever, nausea, or vomiting associated with current symptoms.  Education provided to patient printed handouts given.  Rx sent to pharmacy.  Follow-up for worsening unresolved symptoms.

## 2021-03-01 NOTE — Patient Instructions (Signed)
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
## 2021-03-03 LAB — URINE CULTURE

## 2021-03-12 ENCOUNTER — Other Ambulatory Visit: Payer: Self-pay | Admitting: Family Medicine

## 2021-03-12 DIAGNOSIS — K219 Gastro-esophageal reflux disease without esophagitis: Secondary | ICD-10-CM

## 2021-03-12 DIAGNOSIS — E119 Type 2 diabetes mellitus without complications: Secondary | ICD-10-CM
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
## 2021-03-22 ENCOUNTER — Other Ambulatory Visit: Payer: Self-pay

## 2021-03-22 ENCOUNTER — Encounter: Payer: Self-pay | Admitting: Cardiology

## 2021-03-22 ENCOUNTER — Ambulatory Visit (INDEPENDENT_AMBULATORY_CARE_PROVIDER_SITE_OTHER): Payer: 59 | Admitting: Cardiology

## 2021-03-22 VITALS — BP 110/83 | HR 64 | Ht 70.0 in | Wt 263.0 lb

## 2021-03-22 DIAGNOSIS — E785 Hyperlipidemia, unspecified: Secondary | ICD-10-CM

## 2021-03-22 DIAGNOSIS — I2510 Atherosclerotic heart disease of native coronary artery without angina pectoris: Secondary | ICD-10-CM | POA: Diagnosis not present

## 2021-03-22 DIAGNOSIS — I10 Essential (primary) hypertension: Secondary | ICD-10-CM

## 2021-03-22 NOTE — Patient Instructions (Signed)
Medication Instructions:  The current medical regimen is effective;  continue present plan and medications.  *If you need a refill on your cardiac medications before your next appointment, please call your pharmacy*  Follow-Up: At [facility], you and your health needs are our priority.  As part of our continuing mission to provide you with exceptional heart care, we have created designated Provider Care Teams.  These Care Teams include your primary Cardiologist (physician) and Advanced Practice Providers (APPs -  Physician Assistants and Nurse Practitioners) who all work together to provide you with the care you need, when you need it.  We recommend signing up for the patient portal called "MyChart".  Sign up information is provided on this After Visit Summary.  MyChart is used to connect with patients for Virtual Visits (Telemedicine).  Patients are able to view lab/test results, encounter notes, upcoming appointments, etc.  Non-urgent messages can be sent to your provider as well.   To learn more about what you can do with MyChart, go to [email or web address].    Your next appointment:   1 year(s)  The format for your next appointment:   In Person  Provider:   James Hochrein, MD   Thank you for choosing [facility]!!    

## 2021-04-05 ENCOUNTER — Other Ambulatory Visit: Payer: Self-pay

## 2021-04-05 ENCOUNTER — Ambulatory Visit (INDEPENDENT_AMBULATORY_CARE_PROVIDER_SITE_OTHER): Payer: 59 | Admitting: Family Medicine

## 2021-04-05 ENCOUNTER — Encounter: Payer: Self-pay | Admitting: Family Medicine

## 2021-04-05 VITALS — BP 134/84 | HR 60 | Temp 97.8°F | Ht 70.0 in | Wt 262.2 lb

## 2021-04-05 DIAGNOSIS — Z0000 Encounter for general adult medical examination without abnormal findings: Secondary | ICD-10-CM

## 2021-04-05 DIAGNOSIS — I152 Hypertension secondary to endocrine disorders: Secondary | ICD-10-CM

## 2021-04-05 DIAGNOSIS — Z0001 Encounter for general adult medical examination with abnormal findings: Secondary | ICD-10-CM

## 2021-04-05 DIAGNOSIS — E1169 Type 2 diabetes mellitus with other specified complication: Secondary | ICD-10-CM

## 2021-04-05 DIAGNOSIS — E1159 Type 2 diabetes mellitus with other circulatory complications: Secondary | ICD-10-CM | POA: Diagnosis not present

## 2021-04-05 DIAGNOSIS — E785 Hyperlipidemia, unspecified: Secondary | ICD-10-CM

## 2021-04-05 DIAGNOSIS — Z23 Encounter for immunization: Secondary | ICD-10-CM

## 2021-04-05 DIAGNOSIS — E119 Type 2 diabetes mellitus without complications: Secondary | ICD-10-CM

## 2021-04-05 DIAGNOSIS — R110 Nausea: Secondary | ICD-10-CM | POA: Diagnosis not present

## 2021-04-05 LAB — BAYER DCA HB A1C WAIVED: HB A1C (BAYER DCA - WAIVED): 6.9 % (ref <7.0)

## 2021-04-05 MED ORDER — SHINGRIX 50 MCG/0.5ML IM SUSR: 0.5000 mL | Freq: Once | INTRAMUSCULAR | 0 refills | Status: AC

## 2021-04-05 MED ORDER — ONDANSETRON HCL 8 MG PO TABS
8.0000 mg | ORAL_TABLET | Freq: Three times a day (TID) | ORAL | 2 refills | Status: DC | PRN
Start: 2021-04-05 — End: 2022-07-30

## 2021-04-05 NOTE — Patient Instructions (Addendum)
You will need to check with the pharmacy about the price of the shingles vaccine. They will not call you about this. They will run it through your insurance and tell you the cost before giving it to you.  Try CoQ10 to help with muscle pains from your statin.
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
## 2021-04-06 ENCOUNTER — Ambulatory Visit (INDEPENDENT_AMBULATORY_CARE_PROVIDER_SITE_OTHER): Payer: 59 | Admitting: Pharmacist

## 2021-04-06 DIAGNOSIS — E119 Type 2 diabetes mellitus without complications: Secondary | ICD-10-CM | POA: Diagnosis not present

## 2021-04-06 LAB — LIPID PANEL
Chol/HDL Ratio: 3.5 ratio (ref 0.0–5.0)
Cholesterol, Total: 110 mg/dL (ref 100–199)
HDL: 31 mg/dL — ABNORMAL LOW (ref >39)
LDL Chol Calc (NIH): 48 mg/dL (ref 0–99)
Triglycerides: 190 mg/dL — ABNORMAL HIGH (ref 0–149)
VLDL Cholesterol Cal: 31 mg/dL (ref 5–40)

## 2021-04-06 LAB — CBC WITH DIFFERENTIAL/PLATELET
Basophils Absolute: 0.1 10*3/uL (ref 0.0–0.2)
Basos: 1 %
EOS (ABSOLUTE): 0.2 10*3/uL (ref 0.0–0.4)
Eos: 2 %
Hematocrit: 47.6 % (ref 37.5–51.0)
Hemoglobin: 16.2 g/dL (ref 13.0–17.7)
Immature Grans (Abs): 0.1 10*3/uL (ref 0.0–0.1)
Immature Granulocytes: 1 %
Lymphocytes Absolute: 5.1 10*3/uL — ABNORMAL HIGH (ref 0.7–3.1)
Lymphs: 46 %
MCH: 32.7 pg (ref 26.6–33.0)
MCHC: 34 g/dL (ref 31.5–35.7)
MCV: 96 fL (ref 79–97)
Monocytes Absolute: 1.4 10*3/uL — ABNORMAL HIGH (ref 0.1–0.9)
Monocytes: 13 %
Neutrophils Absolute: 4 10*3/uL (ref 1.4–7.0)
Neutrophils: 37 %
Platelets: 235 10*3/uL (ref 150–450)
RBC: 4.96 x10E6/uL (ref 4.14–5.80)
RDW: 13.3 % (ref 11.6–15.4)
WBC: 10.8 10*3/uL (ref 3.4–10.8)

## 2021-04-06 LAB — CMP14+EGFR
ALT: 35 IU/L (ref 0–44)
AST: 45 IU/L — ABNORMAL HIGH (ref 0–40)
Albumin/Globulin Ratio: 1.7 (ref 1.2–2.2)
Albumin: 4.3 g/dL (ref 3.8–4.8)
Alkaline Phosphatase: 88 IU/L (ref 44–121)
BUN/Creatinine Ratio: 19 (ref 10–24)
BUN: 15 mg/dL (ref 8–27)
Bilirubin Total: 0.3 mg/dL (ref 0.0–1.2)
CO2: 20 mmol/L (ref 20–29)
Calcium: 9.3 mg/dL (ref 8.6–10.2)
Chloride: 104 mmol/L (ref 96–106)
Creatinine, Ser: 0.77 mg/dL (ref 0.76–1.27)
Globulin, Total: 2.6 g/dL (ref 1.5–4.5)
Glucose: 173 mg/dL — ABNORMAL HIGH (ref 65–99)
Potassium: 4.4 mmol/L (ref 3.5–5.2)
Sodium: 140 mmol/L (ref 134–144)
Total Protein: 6.9 g/dL (ref 6.0–8.5)
eGFR: 100 mL/min/{1.73_m2} (ref >59)

## 2021-04-06 MED ORDER — TIRZEPATIDE 2.5 MG/0.5ML ~~LOC~~ SOAJ: 2.5000 mg | SUBCUTANEOUS | 0 refills | Status: DC

## 2021-04-06 NOTE — Progress Notes (Signed)
    04/06/2021 Name: [name] MRN: [id number] DOB: 1956/02/01   S:  73 yoM Presents for diabetes evaluation, education, and management Patient was referred and last seen by Primary Care Provider yesterday  Insurance coverage/medication affordability: switching to [facility]  Patient reports adherence with medications. Current diabetes medications include: januvia,metformin Current hypertension medications include: lisinopril Goal 130/80 Current hyperlipidemia medications include: rosuvastatin     Patient denies hypoglycemic events.   Patient reported dietary habits: Eats 3 meals/day Discussed meal planning options and Plate method for healthy eating Avoid sugary drinks and desserts Incorporate balanced protein, non starchy veggies, 1 serving of carbohydrate with each meal Increase water intake Increase physical activity as able   Patient-reported exercise habits: regimen daily-pushups,situps  O:  Lab Results  Component Value Date   HGBA1C 6.9 04/05/2021   Lipid Panel     Component Value Date/Time   CHOL 110 04/05/2021 0924   CHOL 122 05/11/2019 0841   TRIG 190 (H) 04/05/2021 0924   TRIG 165 (H) 05/11/2019 0841   HDL 31 (L) 04/05/2021 0924   HDL CANCELED 03/21/2016 1037   HDL 41 01/19/2013 0819   CHOLHDL 3.5 04/05/2021 0924   CHOLHDL 3.6 09/04/2010 0631   VLDL 68 (H) 09/04/2010 0631   LDLCALC 48 04/05/2021 0924   LDLCALC 34 01/19/2013 0819   LDLDIRECT 95 03/23/2015 0958     Home fasting blood sugars: 140s  2 hour post-meal/random blood sugars: n/a.    Clinical Atherosclerotic Cardiovascular Disease (ASCVD): No   The ASCVD Risk score Denman George DC Jr., et al., 2013) failed to calculate for the following reasons:   The valid total cholesterol range is 130 to 320 mg/dL    A/P:  Diabetes H4LP currently uncontrolled. Patient is adherent with medication. Control is suboptimal due to diet.  -Start mounjaro 2.5mg  sq weekly--1st injection done in  office  F/u in 4 weeks for dose titration  Denies personal and family history of Medullary thyroid cancer (MTC)    -Continue metformin  -STOP januvia  -Extensively discussed pathophysiology of diabetes, recommended lifestyle interventions, dietary effects on blood sugar control  -Counseled on s/sx of and management of hypoglycemia  -Next A1C anticipated 3 months.  Written patient instructions provided.  Total time in face to face counseling 25 minutes.   Follow up Pharmacist in 4 weeks  Kieth Brightly, PharmD, BCPS Clinical Pharmacist, [facility]  II Phone [phone]

## 2021-04-27 ENCOUNTER — Ambulatory Visit (INDEPENDENT_AMBULATORY_CARE_PROVIDER_SITE_OTHER): Payer: Self-pay | Admitting: Pharmacist

## 2021-04-27 DIAGNOSIS — E119 Type 2 diabetes mellitus without complications: Secondary | ICD-10-CM

## 2021-04-27 MED ORDER — TIRZEPATIDE 2.5 MG/0.5ML ~~LOC~~ SOAJ: 2.5000 mg | SUBCUTANEOUS | 3 refills | Status: DC

## 2021-04-27 NOTE — Progress Notes (Signed)
    04/27/2021 Name: DOMINION KATHAN MRN: [id number] DOB: 04/30/1956   S:  65YOM Presents for diabetes evaluation, education, and management Patient was referred and last seen by Primary Care Provider on 04/05/21. Insurance coverage/medication affordability: UHC  Patient reports adherence with medications. Current diabetes medications include: MOUNJARO, METFORMIN Current hypertension medications include: LISINOPRIL Goal 130/80 Current hyperlipidemia medications include: ROSUVASTATIN   Patient denies hypoglycemic events.   Patient reported dietary habits: Eats 3 meals/day Discussed meal planning options and Plate method for healthy eating Avoid sugary drinks and desserts Incorporate balanced protein, non starchy veggies, 1 serving of carbohydrate with each meal Increase water intake Increase physical activity as able   Patient-reported exercise habits: PUSHUPS, SITUPS  Patient reports nocturia (nighttime urination).  Patient denies neuropathy (nerve pain).  Patient denies visual changes.  Patient reports self foot exams.    O:  Lab Results  Component Value Date   HGBA1C 6.9 04/05/2021    There were no vitals filed for this visit.     Lipid Panel     Component Value Date/Time   CHOL 110 04/05/2021 0924   CHOL 122 05/11/2019 0841   TRIG 190 (H) 04/05/2021 0924   TRIG 165 (H) 05/11/2019 0841   HDL 31 (L) 04/05/2021 0924   HDL CANCELED 03/21/2016 1037   HDL 41 01/19/2013 0819   CHOLHDL 3.5 04/05/2021 0924   CHOLHDL 3.6 09/04/2010 0631   VLDL 68 (H) 09/04/2010 0631   LDLCALC 48 04/05/2021 0924   LDLCALC 34 01/19/2013 0819   LDLDIRECT 95 03/23/2015 0958    Home fasting blood sugars: 100-140s  2 hour post-meal/random blood sugars: n/a.    Clinical Atherosclerotic Cardiovascular Disease (ASCVD): No   The ASCVD Risk score (Arnett DK, et al., 2019) failed to calculate for the following reasons:   The valid total cholesterol range is 130 to 320 mg/dL     A/P:  Diabetes E6LJ currently uncontrolled. Patient is adherent with medication. Control is suboptimal due to diet.   -CONTINUE Mounjaro 2.5mg  sq weekly--patient reports some nausea, therefore will keep at the 2.5mg  dose  F/u in 2 months for dose titration  $25 copay card left up front             Denies personal and family history of Medullary thyroid cancer (MTC)               -Continue metformin   -Extensively discussed pathophysiology of diabetes, recommended lifestyle interventions, dietary effects on blood sugar control  -Counseled on s/sx of and management of hypoglycemia  -Next A1C anticipated 3 months.   Written patient instructions provided.  Total time in face to face counseling 25 minutes.   Follow up Pharmacist Clinic Visit in 2 months.   Kieth Brightly, PharmD, BCPS Clinical Pharmacist, [facility]  II Phone [phone]

## 2021-05-04 ENCOUNTER — Telehealth: Payer: 59

## 2021-05-11 ENCOUNTER — Other Ambulatory Visit: Payer: Self-pay

## 2021-05-11 ENCOUNTER — Ambulatory Visit (INDEPENDENT_AMBULATORY_CARE_PROVIDER_SITE_OTHER): Payer: 59 | Admitting: Pharmacist

## 2021-05-11 DIAGNOSIS — E119 Type 2 diabetes mellitus without complications: Secondary | ICD-10-CM

## 2021-05-28 ENCOUNTER — Other Ambulatory Visit: Payer: Self-pay | Admitting: Family Medicine

## 2021-05-28 DIAGNOSIS — E119 Type 2 diabetes mellitus without complications: Secondary | ICD-10-CM

## 2021-05-28 DIAGNOSIS — N481 Balanitis: Secondary | ICD-10-CM

## 2021-05-28 DIAGNOSIS — B002 Herpesviral gingivostomatitis and pharyngotonsillitis: Secondary | ICD-10-CM

## 2021-05-28 DIAGNOSIS — K219 Gastro-esophageal reflux disease without esophagitis: Secondary | ICD-10-CM

## 2021-06-09 MED ORDER — TIRZEPATIDE 5 MG/0.5ML ~~LOC~~ SOAJ: 5.0000 mg | SUBCUTANEOUS | 3 refills | Status: DC

## 2021-06-09 NOTE — Progress Notes (Signed)
    05/11/2021 Name: [name] MRN: [id number] DOB: 05-21-1956   S:  65YOM Presents for diabetes evaluation, education, and management. Patient is doing well on Mounjaro.  Denies sider effects.  Reports redness at injection site, but this goes away after a day or so. Insurance coverage/medication affordability: [employer]HC   Patient reports adherence with medications. Current diabetes medications include: MOUNJARO, METFORMIN Current hypertension medications include: LISINOPRIL Goal 130/80 Current hyperlipidemia medications include: ROSUVASTATIN   Patient denies hypoglycemic events.   Patient reported dietary habits: Eats 3 meals/day Discussed meal planning options and Plate method for healthy eating Avoid sugary drinks and desserts Incorporate balanced protein, non starchy veggies, 1 serving of carbohydrate with each meal Increase water intake Increase physical activity as able    Patient-reported exercise habits: PUSHUPS, SITUPS   Patient reports nocturia (nighttime urination).   Patient denies neuropathy (nerve pain).   Patient denies visual changes.   Patient reports self foot exams.    O:  Lab Results  Component Value Date   HGBA1C 6.9 04/05/2021    Lipid Panel     Component Value Date/Time   CHOL 110 04/05/2021 0924   CHOL 122 05/11/2019 0841   TRIG 190 (H) 04/05/2021 0924   TRIG 165 (H) 05/11/2019 0841   HDL 31 (L) 04/05/2021 0924   HDL CANCELED 03/21/2016 1037   HDL 41 01/19/2013 0819   CHOLHDL 3.5 04/05/2021 0924   CHOLHDL 3.6 09/04/2010 0631   VLDL 68 (H) 09/04/2010 0631   LDLCALC 48 04/05/2021 0924   LDLCALC 34 01/19/2013 0819   LDLDIRECT 95 03/23/2015 0958    Home fasting blood sugars: <130  2 hour post-meal/random blood sugars: <175.      A/P:  Diabetes T2DM currently uncontrolled. Patient is adherent with medication. Control is suboptimal due to diet.   -INCREASE Mounjaro TO 5mg  sq weekly--mild nause, but only when taking daytime  metformin             F/u in 2 months for dose titration             $25 copay card at [facility]             Denies personal and family history of Medullary thyroid cancer (MTC)               -DECREASE metformin to one tablet at night  Was having nausea with day time tablet and also increasing Mounjaro--> okay to decrease metformin  GFR>100   -Extensively discussed pathophysiology of diabetes, recommended lifestyle interventions, dietary effects on blood sugar control   -Counseled on s/sx of and management of hypoglycemia   -Next A1C anticipated 3 months.     Written patient instructions provided.  Total time in face to face counseling 25 minutes.    Follow up Pharmacist Clinic Visit in 2 months.    , PharmD, BCPS Clinical Pharmacist, [facility]  II Phone [phone]

## 2021-07-23 ENCOUNTER — Other Ambulatory Visit: Payer: Self-pay | Admitting: Family Medicine

## 2021-07-23 DIAGNOSIS — B002 Herpesviral gingivostomatitis and pharyngotonsillitis: Secondary | ICD-10-CM

## 2021-07-23 DIAGNOSIS — K219 Gastro-esophageal reflux disease without esophagitis: Secondary | ICD-10-CM

## 2021-08-01 ENCOUNTER — Encounter: Payer: 59 | Admitting: Pharmacist

## 2021-08-01 DIAGNOSIS — E119 Type 2 diabetes mellitus without complications: Secondary | ICD-10-CM

## 2021-08-01 NOTE — Progress Notes (Signed)
This encounter was created in error - please disregard.

## 2021-08-10 ENCOUNTER — Telehealth: Payer: Self-pay | Admitting: Family Medicine

## 2021-08-10 NOTE — Telephone Encounter (Signed)
Returned call  Patient not home Left message requesting call back

## 2021-08-14 ENCOUNTER — Other Ambulatory Visit: Payer: Self-pay | Admitting: Cardiology

## 2021-08-22 ENCOUNTER — Other Ambulatory Visit: Payer: Self-pay | Admitting: Family Medicine

## 2021-08-22 DIAGNOSIS — E119 Type 2 diabetes mellitus without complications: Secondary | ICD-10-CM

## 2021-09-24 ENCOUNTER — Other Ambulatory Visit: Payer: Self-pay | Admitting: Family Medicine

## 2021-09-24 DIAGNOSIS — N481 Balanitis: Secondary | ICD-10-CM

## 2021-10-05 LAB — HM DIABETES EYE EXAM

## 2021-10-06 ENCOUNTER — Encounter: Payer: Self-pay | Admitting: Family Medicine

## 2021-10-06 ENCOUNTER — Ambulatory Visit: Payer: 59 | Admitting: Family Medicine

## 2021-10-06 VITALS — BP 130/88 | HR 69 | Temp 97.7°F | Ht 70.0 in | Wt 258.8 lb

## 2021-10-06 DIAGNOSIS — E1169 Type 2 diabetes mellitus with other specified complication: Secondary | ICD-10-CM

## 2021-10-06 DIAGNOSIS — E119 Type 2 diabetes mellitus without complications: Secondary | ICD-10-CM

## 2021-10-06 DIAGNOSIS — I152 Hypertension secondary to endocrine disorders: Secondary | ICD-10-CM | POA: Diagnosis not present

## 2021-10-06 DIAGNOSIS — E1159 Type 2 diabetes mellitus with other circulatory complications: Secondary | ICD-10-CM | POA: Diagnosis not present

## 2021-10-06 DIAGNOSIS — J449 Chronic obstructive pulmonary disease, unspecified: Secondary | ICD-10-CM

## 2021-10-06 DIAGNOSIS — I25119 Atherosclerotic heart disease of native coronary artery with unspecified angina pectoris: Secondary | ICD-10-CM

## 2021-10-06 DIAGNOSIS — G4733 Obstructive sleep apnea (adult) (pediatric): Secondary | ICD-10-CM

## 2021-10-06 DIAGNOSIS — E785 Hyperlipidemia, unspecified: Secondary | ICD-10-CM

## 2021-10-06 DIAGNOSIS — B002 Herpesviral gingivostomatitis and pharyngotonsillitis: Secondary | ICD-10-CM

## 2021-10-06 DIAGNOSIS — K219 Gastro-esophageal reflux disease without esophagitis: Secondary | ICD-10-CM | POA: Diagnosis not present

## 2021-10-06 DIAGNOSIS — I700 Atherosclerosis of aorta: Secondary | ICD-10-CM | POA: Diagnosis not present

## 2021-10-06 LAB — BAYER DCA HB A1C WAIVED: HB A1C (BAYER DCA - WAIVED): 6.3 % — ABNORMAL HIGH (ref 4.8–5.6)

## 2021-10-06 LAB — CBC WITH DIFFERENTIAL/PLATELET

## 2021-10-06 MED ORDER — OMEPRAZOLE 40 MG PO CPDR: 40.0000 mg | DELAYED_RELEASE_CAPSULE | Freq: Every day | ORAL | 1 refills | Status: DC

## 2021-10-06 MED ORDER — TIRZEPATIDE 7.5 MG/0.5ML ~~LOC~~ SOAJ: 7.5000 mg | SUBCUTANEOUS | 1 refills | Status: DC

## 2021-10-06 MED ORDER — VALACYCLOVIR HCL 500 MG PO TABS: 500.0000 mg | ORAL_TABLET | Freq: Every day | ORAL | 1 refills | Status: DC

## 2021-10-06 MED ORDER — ALBUTEROL SULFATE HFA 108 (90 BASE) MCG/ACT IN AERS: 2.0000 | INHALATION_SPRAY | Freq: Four times a day (QID) | RESPIRATORY_TRACT | 2 refills | Status: AC | PRN

## 2021-10-06 MED ORDER — METFORMIN HCL 1000 MG PO TABS: 500.0000 mg | ORAL_TABLET | Freq: Every evening | ORAL | 0 refills | Status: DC
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
## 2021-10-07 LAB — CBC WITH DIFFERENTIAL/PLATELET
Basophils Absolute: 0.1 10*3/uL (ref 0.0–0.2)
Basos: 1 %
EOS (ABSOLUTE): 0.2 10*3/uL (ref 0.0–0.4)
Eos: 2 %
Hematocrit: 50.7 % (ref 37.5–51.0)
Hemoglobin: 17.3 g/dL (ref 13.0–17.7)
Immature Grans (Abs): 0.2 10*3/uL — ABNORMAL HIGH (ref 0.0–0.1)
Immature Granulocytes: 1 %
Lymphocytes Absolute: 5.5 10*3/uL — ABNORMAL HIGH (ref 0.7–3.1)
Lymphs: 44 %
MCH: 32.3 pg (ref 26.6–33.0)
MCHC: 34.1 g/dL (ref 31.5–35.7)
MCV: 95 fL (ref 79–97)
Monocytes Absolute: 1.2 10*3/uL — ABNORMAL HIGH (ref 0.1–0.9)
Monocytes: 10 %
Neutrophils Absolute: 5.1 10*3/uL (ref 1.4–7.0)
Neutrophils: 42 %
Platelets: 242 10*3/uL (ref 150–450)
RBC: 5.36 x10E6/uL (ref 4.14–5.80)
RDW: 13.2 % (ref 11.6–15.4)
WBC: 12.2 10*3/uL — ABNORMAL HIGH (ref 3.4–10.8)

## 2021-10-07 LAB — CMP14+EGFR
ALT: 27 IU/L (ref 0–44)
AST: 27 IU/L (ref 0–40)
Albumin/Globulin Ratio: 1.8 (ref 1.2–2.2)
Albumin: 4.2 g/dL (ref 3.8–4.8)
Alkaline Phosphatase: 103 IU/L (ref 44–121)
BUN/Creatinine Ratio: 18 (ref 10–24)
BUN: 15 mg/dL (ref 8–27)
Bilirubin Total: 0.3 mg/dL (ref 0.0–1.2)
CO2: 22 mmol/L (ref 20–29)
Calcium: 9.4 mg/dL (ref 8.6–10.2)
Chloride: 102 mmol/L (ref 96–106)
Creatinine, Ser: 0.82 mg/dL (ref 0.76–1.27)
Globulin, Total: 2.4 g/dL (ref 1.5–4.5)
Glucose: 148 mg/dL — ABNORMAL HIGH (ref 70–99)
Potassium: 4.7 mmol/L (ref 3.5–5.2)
Sodium: 139 mmol/L (ref 134–144)
Total Protein: 6.6 g/dL (ref 6.0–8.5)
eGFR: 98 mL/min/{1.73_m2} (ref >59)

## 2021-10-07 LAB — LIPID PANEL
Chol/HDL Ratio: 3.6 ratio (ref 0.0–5.0)
Cholesterol, Total: 132 mg/dL (ref 100–199)
HDL: 37 mg/dL — ABNORMAL LOW (ref >39)
LDL Chol Calc (NIH): 68 mg/dL (ref 0–99)
Triglycerides: 154 mg/dL — ABNORMAL HIGH (ref 0–149)
VLDL Cholesterol Cal: 27 mg/dL (ref 5–40)

## 2021-10-15 ENCOUNTER — Other Ambulatory Visit: Payer: Self-pay | Admitting: Family Medicine

## 2021-10-18 ENCOUNTER — Telehealth: Payer: Self-pay | Admitting: Family Medicine

## 2021-10-18 MED ORDER — TIRZEPATIDE 10 MG/0.5ML ~~LOC~~ SOAJ: 10.0000 mg | SUBCUTANEOUS | 1 refills | Status: DC

## 2021-10-18 NOTE — Telephone Encounter (Signed)
Please let patient know I have sent the 10 mg dose as they are able to get it and I want him to stop the Metformin all together. Please continue monitoring blood sugar and schedule a telephone follow-up with Raynelle Fanning in 1 week.  ?

## 2021-10-18 NOTE — Telephone Encounter (Signed)
PT R/C 

## 2021-10-18 NOTE — Telephone Encounter (Signed)
Patient aware and verbalizes understanding. 

## 2021-10-18 NOTE — Telephone Encounter (Signed)
Lmtcb.

## 2021-11-06 ENCOUNTER — Other Ambulatory Visit: Payer: Self-pay | Admitting: Cardiology

## 2021-11-12 ENCOUNTER — Telehealth: Payer: Self-pay | Admitting: Family Medicine

## 2021-11-12 DIAGNOSIS — K219 Gastro-esophageal reflux disease without esophagitis: Secondary | ICD-10-CM

## 2021-11-21 NOTE — Telephone Encounter (Signed)
Key: [id number] - PA Case [id number] Need help? Call us at [phone] ?Status ?Sent to [employer] ?Drug ?Omeprazole 40MG  dr capsules ? ?

## 2021-11-23 NOTE — Telephone Encounter (Signed)
Approved on April 12 ?Your PA request has been approved. Additional information will be provided in the approval communication. (Message 1145) ? ? ?Pharm aware  ?

## 2022-01-03 ENCOUNTER — Encounter: Payer: Self-pay | Admitting: Family Medicine

## 2022-01-03 ENCOUNTER — Ambulatory Visit (INDEPENDENT_AMBULATORY_CARE_PROVIDER_SITE_OTHER): Payer: 59 | Admitting: Family Medicine

## 2022-01-03 ENCOUNTER — Telehealth: Payer: Self-pay | Admitting: Family Medicine

## 2022-01-03 VITALS — BP 125/83 | HR 74 | Temp 95.9°F | Ht 70.0 in | Wt 257.8 lb

## 2022-01-03 DIAGNOSIS — E559 Vitamin D deficiency, unspecified: Secondary | ICD-10-CM

## 2022-01-03 DIAGNOSIS — J449 Chronic obstructive pulmonary disease, unspecified: Secondary | ICD-10-CM | POA: Diagnosis not present

## 2022-01-03 DIAGNOSIS — E8881 Metabolic syndrome: Secondary | ICD-10-CM

## 2022-01-03 DIAGNOSIS — E785 Hyperlipidemia, unspecified: Secondary | ICD-10-CM | POA: Diagnosis not present

## 2022-01-03 DIAGNOSIS — I25119 Atherosclerotic heart disease of native coronary artery with unspecified angina pectoris: Secondary | ICD-10-CM | POA: Diagnosis not present

## 2022-01-03 DIAGNOSIS — E1169 Type 2 diabetes mellitus with other specified complication: Secondary | ICD-10-CM

## 2022-01-03 DIAGNOSIS — E1159 Type 2 diabetes mellitus with other circulatory complications: Secondary | ICD-10-CM | POA: Diagnosis not present

## 2022-01-03 DIAGNOSIS — E119 Type 2 diabetes mellitus without complications: Secondary | ICD-10-CM | POA: Diagnosis not present

## 2022-01-03 DIAGNOSIS — J302 Other seasonal allergic rhinitis: Secondary | ICD-10-CM

## 2022-01-03 DIAGNOSIS — I152 Hypertension secondary to endocrine disorders: Secondary | ICD-10-CM

## 2022-01-03 DIAGNOSIS — I700 Atherosclerosis of aorta: Secondary | ICD-10-CM

## 2022-01-03 DIAGNOSIS — E1165 Type 2 diabetes mellitus with hyperglycemia: Secondary | ICD-10-CM

## 2022-01-03 LAB — BAYER DCA HB A1C WAIVED: HB A1C (BAYER DCA - WAIVED): 6.9 % — ABNORMAL HIGH (ref 4.8–5.6)

## 2022-01-03 MED ORDER — FLUTICASONE PROPIONATE 50 MCG/ACT NA SUSP: 2.0000 | Freq: Every day | NASAL | 6 refills | Status: DC

## 2022-01-03 MED ORDER — TIRZEPATIDE 12.5 MG/0.5ML ~~LOC~~ SOAJ: 12.5000 mg | SUBCUTANEOUS | 1 refills | Status: DC

## 2022-01-03 MED ORDER — LEVOCETIRIZINE DIHYDROCHLORIDE 5 MG PO TABS: 5.0000 mg | ORAL_TABLET | Freq: Every evening | ORAL | 2 refills | Status: DC
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
## 2022-01-03 NOTE — Telephone Encounter (Signed)
Appointment scheduled.

## 2022-01-03 NOTE — Telephone Encounter (Signed)
PT R/C 

## 2022-01-03 NOTE — Telephone Encounter (Signed)
[email or web address]

## 2022-01-04 LAB — CBC WITH DIFFERENTIAL/PLATELET
Basophils Absolute: 0.1 10*3/uL (ref 0.0–0.2)
Basos: 1 %
EOS (ABSOLUTE): 0.3 10*3/uL (ref 0.0–0.4)
Eos: 3 %
Hematocrit: 50.7 % (ref 37.5–51.0)
Hemoglobin: 17.8 g/dL — ABNORMAL HIGH (ref 13.0–17.7)
Immature Grans (Abs): 0.2 10*3/uL — ABNORMAL HIGH (ref 0.0–0.1)
Immature Granulocytes: 2 %
Lymphocytes Absolute: 4.6 10*3/uL — ABNORMAL HIGH (ref 0.7–3.1)
Lymphs: 45 %
MCH: 33.3 pg — ABNORMAL HIGH (ref 26.6–33.0)
MCHC: 35.1 g/dL (ref 31.5–35.7)
MCV: 95 fL (ref 79–97)
Monocytes Absolute: 1.1 10*3/uL — ABNORMAL HIGH (ref 0.1–0.9)
Monocytes: 11 %
Neutrophils Absolute: 3.9 10*3/uL (ref 1.4–7.0)
Neutrophils: 38 %
Platelets: 226 10*3/uL (ref 150–450)
RBC: 5.35 x10E6/uL (ref 4.14–5.80)
RDW: 14.2 % (ref 11.6–15.4)
WBC: 10 10*3/uL (ref 3.4–10.8)

## 2022-01-04 LAB — CMP14+EGFR
ALT: 33 IU/L (ref 0–44)
AST: 28 IU/L (ref 0–40)
Albumin/Globulin Ratio: 1.7 (ref 1.2–2.2)
Albumin: 4.5 g/dL (ref 3.8–4.8)
Alkaline Phosphatase: 130 IU/L — ABNORMAL HIGH (ref 44–121)
BUN/Creatinine Ratio: 22 (ref 10–24)
BUN: 17 mg/dL (ref 8–27)
Bilirubin Total: 0.3 mg/dL (ref 0.0–1.2)
CO2: 19 mmol/L — ABNORMAL LOW (ref 20–29)
Calcium: 9.6 mg/dL (ref 8.6–10.2)
Chloride: 103 mmol/L (ref 96–106)
Creatinine, Ser: 0.76 mg/dL (ref 0.76–1.27)
Globulin, Total: 2.7 g/dL (ref 1.5–4.5)
Glucose: 180 mg/dL — ABNORMAL HIGH (ref 70–99)
Potassium: 4.5 mmol/L (ref 3.5–5.2)
Sodium: 138 mmol/L (ref 134–144)
Total Protein: 7.2 g/dL (ref 6.0–8.5)
eGFR: 100 mL/min/{1.73_m2} (ref >59)

## 2022-01-04 LAB — LIPID PANEL
Chol/HDL Ratio: 3.5 ratio (ref 0.0–5.0)
Cholesterol, Total: 137 mg/dL (ref 100–199)
HDL: 39 mg/dL — ABNORMAL LOW (ref >39)
LDL Chol Calc (NIH): 64 mg/dL (ref 0–99)
Triglycerides: 203 mg/dL — ABNORMAL HIGH (ref 0–149)
VLDL Cholesterol Cal: 34 mg/dL (ref 5–40)

## 2022-01-04 LAB — MICROALBUMIN / CREATININE URINE RATIO
Creatinine, Urine: 234.2 mg/dL
Microalb/Creat Ratio: 27 mg/g creat (ref 0–29)
Microalbumin, Urine: 64 ug/mL

## 2022-01-04 LAB — VITAMIN B12: Vitamin B-12: 245 pg/mL (ref 232–1245)

## 2022-01-04 LAB — VITAMIN D 25 HYDROXY (VIT D DEFICIENCY, FRACTURES): Vit D, 25-Hydroxy: 28.1 ng/mL — ABNORMAL LOW (ref 30.0–100.0)

## 2022-01-19 ENCOUNTER — Other Ambulatory Visit: Payer: Self-pay | Admitting: Cardiology

## 2022-01-19 ENCOUNTER — Other Ambulatory Visit: Payer: Self-pay | Admitting: Family Medicine

## 2022-01-19 DIAGNOSIS — K219 Gastro-esophageal reflux disease without esophagitis: Secondary | ICD-10-CM

## 2022-01-26 ENCOUNTER — Other Ambulatory Visit: Payer: Self-pay | Admitting: Cardiology

## 2022-01-26 ENCOUNTER — Telehealth: Payer: Self-pay | Admitting: Family Medicine

## 2022-01-26 NOTE — Telephone Encounter (Signed)
I need an updated contact number

## 2022-02-20 ENCOUNTER — Other Ambulatory Visit: Payer: Self-pay | Admitting: Family Medicine

## 2022-02-20 DIAGNOSIS — J302 Other seasonal allergic rhinitis: Secondary | ICD-10-CM

## 2022-04-05 ENCOUNTER — Other Ambulatory Visit: Payer: Self-pay | Admitting: Family Medicine

## 2022-04-05 DIAGNOSIS — B002 Herpesviral gingivostomatitis and pharyngotonsillitis: Secondary | ICD-10-CM

## 2022-04-10 ENCOUNTER — Encounter: Payer: Self-pay | Admitting: Family Medicine

## 2022-04-10 ENCOUNTER — Other Ambulatory Visit: Payer: Self-pay | Admitting: Cardiology

## 2022-04-10 ENCOUNTER — Ambulatory Visit (INDEPENDENT_AMBULATORY_CARE_PROVIDER_SITE_OTHER): Payer: Medicare HMO | Admitting: Family Medicine

## 2022-04-10 VITALS — BP 130/76 | HR 73 | Temp 97.6°F | Ht 70.0 in | Wt 258.2 lb

## 2022-04-10 DIAGNOSIS — J449 Chronic obstructive pulmonary disease, unspecified: Secondary | ICD-10-CM

## 2022-04-10 DIAGNOSIS — I700 Atherosclerosis of aorta: Secondary | ICD-10-CM

## 2022-04-10 DIAGNOSIS — E1169 Type 2 diabetes mellitus with other specified complication: Secondary | ICD-10-CM | POA: Diagnosis not present

## 2022-04-10 DIAGNOSIS — K219 Gastro-esophageal reflux disease without esophagitis: Secondary | ICD-10-CM | POA: Diagnosis not present

## 2022-04-10 DIAGNOSIS — E559 Vitamin D deficiency, unspecified: Secondary | ICD-10-CM | POA: Diagnosis not present

## 2022-04-10 DIAGNOSIS — E1159 Type 2 diabetes mellitus with other circulatory complications: Secondary | ICD-10-CM | POA: Diagnosis not present

## 2022-04-10 DIAGNOSIS — G4733 Obstructive sleep apnea (adult) (pediatric): Secondary | ICD-10-CM

## 2022-04-10 DIAGNOSIS — K760 Fatty (change of) liver, not elsewhere classified: Secondary | ICD-10-CM

## 2022-04-10 DIAGNOSIS — I25119 Atherosclerotic heart disease of native coronary artery with unspecified angina pectoris: Secondary | ICD-10-CM | POA: Diagnosis not present

## 2022-04-10 DIAGNOSIS — Z125 Encounter for screening for malignant neoplasm of prostate: Secondary | ICD-10-CM | POA: Diagnosis not present

## 2022-04-10 DIAGNOSIS — Z0001 Encounter for general adult medical examination with abnormal findings: Secondary | ICD-10-CM | POA: Diagnosis not present

## 2022-04-10 DIAGNOSIS — E785 Hyperlipidemia, unspecified: Secondary | ICD-10-CM | POA: Diagnosis not present

## 2022-04-10 DIAGNOSIS — I152 Hypertension secondary to endocrine disorders: Secondary | ICD-10-CM | POA: Diagnosis not present

## 2022-04-10 DIAGNOSIS — N401 Enlarged prostate with lower urinary tract symptoms: Secondary | ICD-10-CM

## 2022-04-10 DIAGNOSIS — N3943 Post-void dribbling: Secondary | ICD-10-CM

## 2022-04-10 DIAGNOSIS — E1165 Type 2 diabetes mellitus with hyperglycemia: Secondary | ICD-10-CM | POA: Diagnosis not present

## 2022-04-10 DIAGNOSIS — Z0000 Encounter for general adult medical examination without abnormal findings: Secondary | ICD-10-CM

## 2022-04-10 DIAGNOSIS — Z87891 Personal history of nicotine dependence: Secondary | ICD-10-CM

## 2022-04-10 LAB — BAYER DCA HB A1C WAIVED: HB A1C (BAYER DCA - WAIVED): 7.5 % — ABNORMAL HIGH (ref 4.8–5.6)

## 2022-04-10 MED ORDER — OMEPRAZOLE 40 MG PO CPDR: 40.0000 mg | DELAYED_RELEASE_CAPSULE | Freq: Every day | ORAL | 1 refills | Status: DC
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
## 2022-04-11 LAB — CBC WITH DIFFERENTIAL/PLATELET
Basophils Absolute: 0.1 10*3/uL (ref 0.0–0.2)
Basos: 1 %
EOS (ABSOLUTE): 0.3 10*3/uL (ref 0.0–0.4)
Eos: 2 %
Hematocrit: 49.2 % (ref 37.5–51.0)
Hemoglobin: 17.3 g/dL (ref 13.0–17.7)
Immature Grans (Abs): 0.1 10*3/uL (ref 0.0–0.1)
Immature Granulocytes: 1 %
Lymphocytes Absolute: 5.3 10*3/uL — ABNORMAL HIGH (ref 0.7–3.1)
Lymphs: 46 %
MCH: 33.4 pg — ABNORMAL HIGH (ref 26.6–33.0)
MCHC: 35.2 g/dL (ref 31.5–35.7)
MCV: 95 fL (ref 79–97)
Monocytes Absolute: 1.4 10*3/uL — ABNORMAL HIGH (ref 0.1–0.9)
Monocytes: 12 %
Neutrophils Absolute: 4.4 10*3/uL (ref 1.4–7.0)
Neutrophils: 38 %
Platelets: 224 10*3/uL (ref 150–450)
RBC: 5.18 x10E6/uL (ref 4.14–5.80)
RDW: 12.6 % (ref 11.6–15.4)
WBC: 11.4 10*3/uL — ABNORMAL HIGH (ref 3.4–10.8)

## 2022-04-11 LAB — LIPID PANEL
Chol/HDL Ratio: 3 ratio (ref 0.0–5.0)
Cholesterol, Total: 125 mg/dL (ref 100–199)
HDL: 41 mg/dL (ref >39)
LDL Chol Calc (NIH): 54 mg/dL (ref 0–99)
Triglycerides: 179 mg/dL — ABNORMAL HIGH (ref 0–149)
VLDL Cholesterol Cal: 30 mg/dL (ref 5–40)

## 2022-04-11 LAB — CMP14+EGFR
ALT: 36 IU/L (ref 0–44)
AST: 32 IU/L (ref 0–40)
Albumin/Globulin Ratio: 1.6 (ref 1.2–2.2)
Albumin: 4.2 g/dL (ref 3.9–4.9)
Alkaline Phosphatase: 148 IU/L — ABNORMAL HIGH (ref 44–121)
BUN/Creatinine Ratio: 23 (ref 10–24)
BUN: 18 mg/dL (ref 8–27)
Bilirubin Total: 0.4 mg/dL (ref 0.0–1.2)
CO2: 19 mmol/L — ABNORMAL LOW (ref 20–29)
Calcium: 9.2 mg/dL (ref 8.6–10.2)
Chloride: 102 mmol/L (ref 96–106)
Creatinine, Ser: 0.79 mg/dL (ref 0.76–1.27)
Globulin, Total: 2.6 g/dL (ref 1.5–4.5)
Glucose: 175 mg/dL — ABNORMAL HIGH (ref 70–99)
Potassium: 4.2 mmol/L (ref 3.5–5.2)
Sodium: 138 mmol/L (ref 134–144)
Total Protein: 6.8 g/dL (ref 6.0–8.5)
eGFR: 99 mL/min/{1.73_m2} (ref >59)

## 2022-04-11 LAB — PSA, TOTAL AND FREE
PSA, Free Pct: 21.7 %
PSA, Free: 0.13 ng/mL
Prostate Specific Ag, Serum: 0.6 ng/mL (ref 0.0–4.0)

## 2022-04-12 LAB — VITAMIN D 25 HYDROXY (VIT D DEFICIENCY, FRACTURES): Vit D, 25-Hydroxy: 29.3 ng/mL — ABNORMAL LOW (ref 30.0–100.0)

## 2022-04-12 LAB — SPECIMEN STATUS REPORT

## 2022-04-24 ENCOUNTER — Ambulatory Visit (HOSPITAL_COMMUNITY): Payer: Medicare HMO

## 2022-05-09 ENCOUNTER — Other Ambulatory Visit: Payer: Self-pay | Admitting: Cardiology

## 2022-05-17 ENCOUNTER — Other Ambulatory Visit: Payer: Self-pay

## 2022-05-17 DIAGNOSIS — J302 Other seasonal allergic rhinitis: Secondary | ICD-10-CM

## 2022-05-17 DIAGNOSIS — B002 Herpesviral gingivostomatitis and pharyngotonsillitis: Secondary | ICD-10-CM

## 2022-05-17 DIAGNOSIS — K219 Gastro-esophageal reflux disease without esophagitis: Secondary | ICD-10-CM

## 2022-05-17 MED ORDER — VALACYCLOVIR HCL 500 MG PO TABS: 500.0000 mg | ORAL_TABLET | Freq: Every day | ORAL | 1 refills | Status: DC

## 2022-05-17 MED ORDER — LEVOCETIRIZINE DIHYDROCHLORIDE 5 MG PO TABS: 5.0000 mg | ORAL_TABLET | Freq: Every evening | ORAL | 1 refills | Status: DC

## 2022-05-17 MED ORDER — OMEPRAZOLE 40 MG PO CPDR: 40.0000 mg | DELAYED_RELEASE_CAPSULE | Freq: Every day | ORAL | 1 refills | Status: DC

## 2022-05-17 MED ORDER — METOPROLOL SUCCINATE ER 50 MG PO TB24: ORAL_TABLET | ORAL | 0 refills | Status: DC

## 2022-05-17 MED ORDER — LISINOPRIL 5 MG PO TABS: 5.0000 mg | ORAL_TABLET | Freq: Every day | ORAL | 0 refills | Status: DC

## 2022-05-17 MED ORDER — ROSUVASTATIN CALCIUM 40 MG PO TABS: 40.0000 mg | ORAL_TABLET | Freq: Every day | ORAL | 0 refills | Status: DC

## 2022-05-18 ENCOUNTER — Other Ambulatory Visit: Payer: Self-pay | Admitting: *Deleted

## 2022-05-18 DIAGNOSIS — E1165 Type 2 diabetes mellitus with hyperglycemia: Secondary | ICD-10-CM

## 2022-05-18 DIAGNOSIS — J302 Other seasonal allergic rhinitis: Secondary | ICD-10-CM

## 2022-05-18 DIAGNOSIS — E8881 Metabolic syndrome: Secondary | ICD-10-CM

## 2022-05-18 MED ORDER — FLUTICASONE PROPIONATE 50 MCG/ACT NA SUSP: 2.0000 | Freq: Every day | NASAL | 1 refills | Status: DC

## 2022-05-18 MED ORDER — TIRZEPATIDE 12.5 MG/0.5ML ~~LOC~~ SOAJ: 12.5000 mg | SUBCUTANEOUS | 0 refills | Status: DC

## 2022-05-23 ENCOUNTER — Other Ambulatory Visit: Payer: Self-pay | Admitting: *Deleted

## 2022-05-23 MED ORDER — PRASUGREL HCL 10 MG PO TABS: 10.0000 mg | ORAL_TABLET | Freq: Every day | ORAL | 0 refills | Status: DC

## 2022-05-23 NOTE — Telephone Encounter (Signed)
Refills has been sent to the pharmacy. 
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
## 2022-06-20 ENCOUNTER — Ambulatory Visit (INDEPENDENT_AMBULATORY_CARE_PROVIDER_SITE_OTHER): Payer: Medicare HMO | Admitting: Cardiology

## 2022-06-20 ENCOUNTER — Encounter: Payer: Self-pay | Admitting: Cardiology

## 2022-06-20 VITALS — BP 124/74 | HR 72 | Ht 70.0 in | Wt 255.8 lb

## 2022-06-20 DIAGNOSIS — I2510 Atherosclerotic heart disease of native coronary artery without angina pectoris: Secondary | ICD-10-CM

## 2022-06-20 DIAGNOSIS — E118 Type 2 diabetes mellitus with unspecified complications: Secondary | ICD-10-CM | POA: Diagnosis not present

## 2022-06-20 DIAGNOSIS — E785 Hyperlipidemia, unspecified: Secondary | ICD-10-CM

## 2022-06-20 DIAGNOSIS — E119 Type 2 diabetes mellitus without complications: Secondary | ICD-10-CM | POA: Diagnosis not present

## 2022-06-20 DIAGNOSIS — I10 Essential (primary) hypertension: Secondary | ICD-10-CM | POA: Diagnosis not present

## 2022-06-20 NOTE — Patient Instructions (Signed)
Medication Instructions:  The current medical regimen is effective;  continue present plan and medications.  *If you need a refill on your cardiac medications before your next appointment, please call your pharmacy*  Follow-Up: At [facility], you and your health needs are our priority.  As part of our continuing mission to provide you with exceptional heart care, we have created designated Provider Care Teams.  These Care Teams include your primary Cardiologist (physician) and Advanced Practice Providers (APPs -  Physician Assistants and Nurse Practitioners) who all work together to provide you with the care you need, when you need it.  We recommend signing up for the patient portal called "MyChart".  Sign up information is provided on this After Visit Summary.  MyChart is used to connect with patients for Virtual Visits (Telemedicine).  Patients are able to view lab/test results, encounter notes, upcoming appointments, etc.  Non-urgent messages can be sent to your provider as well.   To learn more about what you can do with MyChart, go to [email or web address].    Your next appointment:   1 year(s)  The format for your next appointment:   In Person  Provider:   James Hochrein, MD     Important Information About Sugar       

## 2022-07-03 ENCOUNTER — Ambulatory Visit (INDEPENDENT_AMBULATORY_CARE_PROVIDER_SITE_OTHER): Payer: Medicare HMO | Admitting: Family Medicine

## 2022-07-03 ENCOUNTER — Encounter: Payer: Self-pay | Admitting: Family Medicine

## 2022-07-03 VITALS — BP 121/78 | HR 61 | Temp 96.8°F | Ht 70.0 in | Wt 251.4 lb

## 2022-07-03 DIAGNOSIS — E1169 Type 2 diabetes mellitus with other specified complication: Secondary | ICD-10-CM

## 2022-07-03 DIAGNOSIS — E1165 Type 2 diabetes mellitus with hyperglycemia: Secondary | ICD-10-CM

## 2022-07-03 DIAGNOSIS — E785 Hyperlipidemia, unspecified: Secondary | ICD-10-CM

## 2022-07-03 DIAGNOSIS — E1159 Type 2 diabetes mellitus with other circulatory complications: Secondary | ICD-10-CM | POA: Diagnosis not present

## 2022-07-03 DIAGNOSIS — I152 Hypertension secondary to endocrine disorders: Secondary | ICD-10-CM

## 2022-07-03 DIAGNOSIS — I700 Atherosclerosis of aorta: Secondary | ICD-10-CM | POA: Diagnosis not present

## 2022-07-03 DIAGNOSIS — J449 Chronic obstructive pulmonary disease, unspecified: Secondary | ICD-10-CM | POA: Diagnosis not present

## 2022-07-03 LAB — BAYER DCA HB A1C WAIVED: HB A1C (BAYER DCA - WAIVED): 6.8 % — ABNORMAL HIGH (ref 4.8–5.6)

## 2022-07-03 NOTE — Patient Instructions (Addendum)
Tips for success with Mounjaro/Ozempic/Wegovy (and by success, how not to be super sick on your stomach): Eat small meals AVOID heavy foods (fried/ high in carbs like bread, pasta, rice) AVOID carbonated beverages (soda/ beer, as these can increase bloating) DOUBLE your water intake (will help you avoid constipation/ dehydration)   Mounjaro/Ozempic/Wegovy CAN cause: Nausea Abdominal pain Increased acid reflux (sometimes presents as "sour burps") Constipation OR Diarrhea Fatigue (especially when you first start it)   Continue to monitor your blood sugars as we discussed and record them. Bring the log to your next appointment.  Take your medications as directed.    Goal Blood glucose:    Fasting (before meals) = 80 to 130   Within 2 hours of eating = less than 180   Understanding your Hemoglobin A1c: 6.8     Diabetes Mellitus and Nutrition    I think that you would greatly benefit from seeing a nutritionist. If this is something you are interested in, please call Dr Gerilyn Pilgrim at [phone] to schedule an appointment.   When you have diabetes (diabetes mellitus), it is very important to have healthy eating habits because your blood sugar (glucose) levels are greatly affected by what you eat and drink. Eating healthy foods in the appropriate amounts, at about the same times every day, can help you: Control your blood glucose. Lower your risk of heart disease. Improve your blood pressure. Reach or maintain a healthy weight.  Every person with diabetes is different, and each person has different needs for a meal plan. Your health care provider may recommend that you work with a diet and nutrition specialist (dietitian) to make a meal plan that is best for you. Your meal plan may vary depending on factors such as: The calories you need. The medicines you take. Your weight. Your blood glucose, blood pressure, and cholesterol levels. Your activity level. Other health conditions  you have, such as heart or kidney disease.  How do carbohydrates affect me? Carbohydrates affect your blood glucose level more than any other type of food. Eating carbohydrates naturally increases the amount of glucose in your blood. Carbohydrate counting is a method for keeping track of how many carbohydrates you eat. Counting carbohydrates is important to keep your blood glucose at a healthy level, especially if you use insulin or take certain oral diabetes medicines. It is important to know how many carbohydrates you can safely have in each meal. This is different for every person. Your dietitian can help you calculate how many carbohydrates you should have at each meal and for snack. Foods that contain carbohydrates include: Bread, cereal, rice, pasta, and crackers. Potatoes and corn. Peas, beans, and lentils. Milk and yogurt. Fruit and juice. Desserts, such as cakes, cookies, ice cream, and candy.  How does alcohol affect me? Alcohol can cause a sudden decrease in blood glucose (hypoglycemia), especially if you use insulin or take certain oral diabetes medicines. Hypoglycemia can be a life-threatening condition. Symptoms of hypoglycemia (sleepiness, dizziness, and confusion) are similar to symptoms of having too much alcohol. If your health care provider says that alcohol is safe for you, follow these guidelines: Limit alcohol intake to no more than 1 drink per day for nonpregnant women and 2 drinks per day for men. One drink equals 12 oz of beer, 5 oz of wine, or 1 oz of hard liquor. Do not drink on an empty stomach. Keep yourself hydrated with water, diet soda, or unsweetened iced tea. Keep in mind that regular soda, juice,  and other mixers may contain a lot of sugar and must be counted as carbohydrates.  What are tips for following this plan?  Reading food labels Start by checking the serving size on the label. The amount of calories, carbohydrates, fats, and other nutrients listed  on the label are based on one serving of the food. Many foods contain more than one serving per package. Check the total grams (g) of carbohydrates in one serving. You can calculate the number of servings of carbohydrates in one serving by dividing the total carbohydrates by 15. For example, if a food has 30 g of total carbohydrates, it would be equal to 2 servings of carbohydrates. Check the number of grams (g) of saturated and trans fats in one serving. Choose foods that have low or no amount of these fats. Check the number of milligrams (mg) of sodium in one serving. Most people should limit total sodium intake to less than 2,300 mg per day. Always check the nutrition information of foods labeled as "low-fat" or "nonfat". These foods may be higher in added sugar or refined carbohydrates and should be avoided. Talk to your dietitian to identify your daily goals for nutrients listed on the label.  Shopping Avoid buying canned, premade, or processed foods. These foods tend to be high in fat, sodium, and added sugar. Shop around the outside edge of the grocery store. This includes fresh fruits and vegetables, bulk grains, fresh meats, and fresh dairy.  Cooking Use low-heat cooking methods, such as baking, instead of high-heat cooking methods like deep frying. Cook using healthy oils, such as olive, canola, or sunflower oil. Avoid cooking with butter, cream, or high-fat meats.  Meal planning Eat meals and snacks regularly, preferably at the same times every day. Avoid going long periods of time without eating. Eat foods high in fiber, such as fresh fruits, vegetables, beans, and whole grains. Talk to your dietitian about how many servings of carbohydrates you can eat at each meal. Eat 4-6 ounces of lean protein each day, such as lean meat, chicken, fish, eggs, or tofu. 1 ounce is equal to 1 ounce of meat, chicken, or fish, 1 egg, or 1/4 cup of tofu. Eat some foods each day that contain healthy  fats, such as avocado, nuts, seeds, and fish.  Lifestyle  Check your blood glucose regularly. Exercise at least 30 minutes 5 or more days each week, or as told by your health care provider. Take medicines as told by your health care provider. Do not use any products that contain nicotine or tobacco, such as cigarettes and e-cigarettes. If you need help quitting, ask your health care provider. Work with a Veterinary surgeon or diabetes educator to identify strategies to manage stress and any emotional and social challenges.  What are some questions to ask my health care provider? Do I need to meet with a diabetes educator? Do I need to meet with a dietitian? What number can I call if I have questions? When are the best times to check my blood glucose?  Where to find more information: American Diabetes Association: diabetes.org/food-and-fitness/food Academy of Nutrition and Dietetics: https://www.vargas.com/ [employer] of Diabetes and Digestive and Kidney Diseases (NIH): Find[employer]el[employer].cz  Summary A healthy meal plan will help you control your blood glucose and maintain a healthy lifestyle. Working with a diet and nutrition specialist (dietitian) can help you make a meal plan that is best for you. Keep in mind that carbohydrates and alcohol have immediate effects on your blood glucose levels.  It is important to count carbohydrates and to use alcohol carefully. This information is not intended to replace advice given to you by your health care provider. Make sure you discuss any questions you have with your health care provider. Document Released: 04/26/2005 Document Revised: 09/03/2016 Document Reviewed: 09/03/2016 Elsevier Interactive Patient Education  [employer].
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
## 2022-07-04 LAB — CMP14+EGFR
ALT: 25 IU/L (ref 0–44)
AST: 28 IU/L (ref 0–40)
Albumin/Globulin Ratio: 1.5 (ref 1.2–2.2)
Albumin: 4.1 g/dL (ref 3.9–4.9)
Alkaline Phosphatase: 139 IU/L — ABNORMAL HIGH (ref 44–121)
BUN/Creatinine Ratio: 18 (ref 10–24)
BUN: 16 mg/dL (ref 8–27)
Bilirubin Total: 0.5 mg/dL (ref 0.0–1.2)
CO2: 19 mmol/L — ABNORMAL LOW (ref 20–29)
Calcium: 9.2 mg/dL (ref 8.6–10.2)
Chloride: 103 mmol/L (ref 96–106)
Creatinine, Ser: 0.91 mg/dL (ref 0.76–1.27)
Globulin, Total: 2.8 g/dL (ref 1.5–4.5)
Glucose: 137 mg/dL — ABNORMAL HIGH (ref 70–99)
Potassium: 4.3 mmol/L (ref 3.5–5.2)
Sodium: 138 mmol/L (ref 134–144)
Total Protein: 6.9 g/dL (ref 6.0–8.5)
eGFR: 94 mL/min/{1.73_m2} (ref >59)

## 2022-07-04 LAB — CBC WITH DIFFERENTIAL/PLATELET
Basophils Absolute: 0.1 10*3/uL (ref 0.0–0.2)
Basos: 1 %
EOS (ABSOLUTE): 0.2 10*3/uL (ref 0.0–0.4)
Eos: 1 %
Hematocrit: 49.9 % (ref 37.5–51.0)
Hemoglobin: 17.3 g/dL (ref 13.0–17.7)
Immature Grans (Abs): 0.1 10*3/uL (ref 0.0–0.1)
Immature Granulocytes: 1 %
Lymphocytes Absolute: 5.1 10*3/uL — ABNORMAL HIGH (ref 0.7–3.1)
Lymphs: 31 %
MCH: 32.8 pg (ref 26.6–33.0)
MCHC: 34.7 g/dL (ref 31.5–35.7)
MCV: 95 fL (ref 79–97)
Monocytes Absolute: 1.5 10*3/uL — ABNORMAL HIGH (ref 0.1–0.9)
Monocytes: 9 %
Neutrophils Absolute: 9.3 10*3/uL — ABNORMAL HIGH (ref 1.4–7.0)
Neutrophils: 57 %
Platelets: 218 10*3/uL (ref 150–450)
RBC: 5.27 x10E6/uL (ref 4.14–5.80)
RDW: 13 % (ref 11.6–15.4)
WBC: 16.2 10*3/uL — ABNORMAL HIGH (ref 3.4–10.8)

## 2022-07-04 LAB — LIPID PANEL
Chol/HDL Ratio: 3.5 ratio (ref 0.0–5.0)
Cholesterol, Total: 116 mg/dL (ref 100–199)
HDL: 33 mg/dL — ABNORMAL LOW (ref >39)
LDL Chol Calc (NIH): 59 mg/dL (ref 0–99)
Triglycerides: 137 mg/dL (ref 0–149)
VLDL Cholesterol Cal: 24 mg/dL (ref 5–40)

## 2022-07-05 ENCOUNTER — Other Ambulatory Visit: Payer: Self-pay | Admitting: Cardiology

## 2022-07-13 ENCOUNTER — Ambulatory Visit: Payer: Medicare HMO | Admitting: Family Medicine

## 2022-07-14 ENCOUNTER — Other Ambulatory Visit: Payer: Self-pay | Admitting: Internal Medicine

## 2022-07-14 ENCOUNTER — Other Ambulatory Visit: Payer: Self-pay | Admitting: Cardiology

## 2022-07-24 ENCOUNTER — Other Ambulatory Visit: Payer: Self-pay | Admitting: Internal Medicine

## 2022-07-25 ENCOUNTER — Other Ambulatory Visit: Payer: Self-pay | Admitting: Cardiology

## 2022-07-26 ENCOUNTER — Other Ambulatory Visit: Payer: Self-pay | Admitting: Family Medicine

## 2022-07-26 DIAGNOSIS — J302 Other seasonal allergic rhinitis: Secondary | ICD-10-CM

## 2022-07-27 ENCOUNTER — Other Ambulatory Visit: Payer: Self-pay | Admitting: Family Medicine

## 2022-07-27 DIAGNOSIS — E8881 Metabolic syndrome: Secondary | ICD-10-CM

## 2022-07-27 DIAGNOSIS — E1165 Type 2 diabetes mellitus with hyperglycemia: Secondary | ICD-10-CM

## 2022-07-30 ENCOUNTER — Other Ambulatory Visit: Payer: Self-pay | Admitting: Family Medicine

## 2022-07-30 DIAGNOSIS — N481 Balanitis: Secondary | ICD-10-CM

## 2022-07-30 DIAGNOSIS — R110 Nausea: Secondary | ICD-10-CM

## 2022-07-30 MED ORDER — CLOTRIMAZOLE-BETAMETHASONE 1-0.05 % EX CREA: TOPICAL_CREAM | CUTANEOUS | 0 refills | Status: DC

## 2022-07-30 MED ORDER — METOPROLOL SUCCINATE ER 50 MG PO TB24: ORAL_TABLET | ORAL | 0 refills | Status: DC

## 2022-07-30 MED ORDER — ONDANSETRON HCL 8 MG PO TABS: 8.0000 mg | ORAL_TABLET | Freq: Three times a day (TID) | ORAL | 2 refills | Status: DC | PRN

## 2022-07-30 NOTE — Telephone Encounter (Signed)
  Prescription Request  07/30/2022  Is this a "Controlled Substance" medicine?   Have you seen your PCP in the last 2 weeks? Appy 2/21   If YES, route message to pool  -  If NO, patient needs to be scheduled for appointment.  What is the name of the medication or equipment? metoprolol succinate (TOPROL-XL) 50 MG 24 hr tablet, ondansetron (ZOFRAN) 8 MG tablet, clotrimazole-betamethasone (LOTRISONE) cream   Have you contacted your pharmacy to request a refill? Pharmacy calling    Which pharmacy would you like this sent to?  [facility] - [place], [place] - [address]      Patient notified that their request is being sent to the clinical staff for review and that they should receive a response within 2 business days.

## 2022-08-21 ENCOUNTER — Other Ambulatory Visit: Payer: Self-pay | Admitting: Family Medicine

## 2022-08-21 DIAGNOSIS — N481 Balanitis: Secondary | ICD-10-CM

## 2022-08-23 ENCOUNTER — Telehealth: Payer: Self-pay | Admitting: Family Medicine

## 2022-08-23 NOTE — Telephone Encounter (Signed)
Aware and verbalizes understanding.  

## 2022-08-27 ENCOUNTER — Other Ambulatory Visit: Payer: Self-pay | Admitting: Family Medicine

## 2022-08-27 DIAGNOSIS — E8881 Metabolic syndrome: Secondary | ICD-10-CM

## 2022-08-27 DIAGNOSIS — E1165 Type 2 diabetes mellitus with hyperglycemia: Secondary | ICD-10-CM

## 2022-08-27 MED ORDER — TIRZEPATIDE 15 MG/0.5ML ~~LOC~~ SOAJ: 15.0000 mg | SUBCUTANEOUS | 3 refills | Status: DC

## 2022-08-27 NOTE — Telephone Encounter (Signed)
Pt's wife made aware. 

## 2022-08-27 NOTE — Telephone Encounter (Signed)
Fax from [facility] 12.5 mg on backorder Member requests Rx for 15 mg If appropriate please send in new Rx for 15 mg, Please advise

## 2022-10-03 ENCOUNTER — Ambulatory Visit (INDEPENDENT_AMBULATORY_CARE_PROVIDER_SITE_OTHER): Payer: Medicare HMO | Admitting: Family Medicine

## 2022-10-03 ENCOUNTER — Encounter: Payer: Self-pay | Admitting: Family Medicine

## 2022-10-03 VITALS — BP 103/69 | HR 65 | Temp 97.6°F | Ht 70.0 in | Wt 240.0 lb

## 2022-10-03 DIAGNOSIS — E785 Hyperlipidemia, unspecified: Secondary | ICD-10-CM | POA: Diagnosis not present

## 2022-10-03 DIAGNOSIS — E1165 Type 2 diabetes mellitus with hyperglycemia: Secondary | ICD-10-CM | POA: Diagnosis not present

## 2022-10-03 DIAGNOSIS — E559 Vitamin D deficiency, unspecified: Secondary | ICD-10-CM

## 2022-10-03 DIAGNOSIS — L2082 Flexural eczema: Secondary | ICD-10-CM | POA: Diagnosis not present

## 2022-10-03 DIAGNOSIS — I152 Hypertension secondary to endocrine disorders: Secondary | ICD-10-CM

## 2022-10-03 DIAGNOSIS — J449 Chronic obstructive pulmonary disease, unspecified: Secondary | ICD-10-CM | POA: Diagnosis not present

## 2022-10-03 DIAGNOSIS — E1159 Type 2 diabetes mellitus with other circulatory complications: Secondary | ICD-10-CM

## 2022-10-03 DIAGNOSIS — Z6834 Body mass index (BMI) 34.0-34.9, adult: Secondary | ICD-10-CM

## 2022-10-03 DIAGNOSIS — E1169 Type 2 diabetes mellitus with other specified complication: Secondary | ICD-10-CM | POA: Diagnosis not present

## 2022-10-03 LAB — BAYER DCA HB A1C WAIVED: HB A1C (BAYER DCA - WAIVED): 6.2 % — ABNORMAL HIGH (ref 4.8–5.6)

## 2022-10-03 LAB — LIPID PANEL

## 2022-10-03 MED ORDER — TRIAMCINOLONE ACETONIDE 0.1 % EX CREA: 1.0000 | TOPICAL_CREAM | Freq: Two times a day (BID) | CUTANEOUS | 0 refills | Status: DC

## 2022-10-03 MED ORDER — TIRZEPATIDE 12.5 MG/0.5ML ~~LOC~~ SOAJ: 12.5000 mg | SUBCUTANEOUS | 6 refills | Status: DC
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
## 2022-10-03 NOTE — Patient Instructions (Addendum)

## 2022-10-04 LAB — CMP14+EGFR
ALT: 18 IU/L (ref 0–44)
AST: 21 IU/L (ref 0–40)
Albumin/Globulin Ratio: 1.6 (ref 1.2–2.2)
Albumin: 4.1 g/dL (ref 3.9–4.9)
Alkaline Phosphatase: 124 IU/L — ABNORMAL HIGH (ref 44–121)
BUN/Creatinine Ratio: 21 (ref 10–24)
BUN: 18 mg/dL (ref 8–27)
Bilirubin Total: 0.4 mg/dL (ref 0.0–1.2)
CO2: 18 mmol/L — ABNORMAL LOW (ref 20–29)
Calcium: 9.2 mg/dL (ref 8.6–10.2)
Chloride: 104 mmol/L (ref 96–106)
Creatinine, Ser: 0.87 mg/dL (ref 0.76–1.27)
Globulin, Total: 2.5 g/dL (ref 1.5–4.5)
Glucose: 131 mg/dL — ABNORMAL HIGH (ref 70–99)
Potassium: 4.1 mmol/L (ref 3.5–5.2)
Sodium: 139 mmol/L (ref 134–144)
Total Protein: 6.6 g/dL (ref 6.0–8.5)
eGFR: 96 mL/min/{1.73_m2} (ref >59)

## 2022-10-04 LAB — CBC WITH DIFFERENTIAL/PLATELET
Basophils Absolute: 0.1 10*3/uL (ref 0.0–0.2)
Basos: 1 %
EOS (ABSOLUTE): 0.2 10*3/uL (ref 0.0–0.4)
Eos: 2 %
Hematocrit: 49.4 % (ref 37.5–51.0)
Hemoglobin: 17.3 g/dL (ref 13.0–17.7)
Immature Grans (Abs): 0.1 10*3/uL (ref 0.0–0.1)
Immature Granulocytes: 1 %
Lymphocytes Absolute: 4.8 10*3/uL — ABNORMAL HIGH (ref 0.7–3.1)
Lymphs: 49 %
MCH: 33.4 pg — ABNORMAL HIGH (ref 26.6–33.0)
MCHC: 35 g/dL (ref 31.5–35.7)
MCV: 95 fL (ref 79–97)
Monocytes Absolute: 1.1 10*3/uL — ABNORMAL HIGH (ref 0.1–0.9)
Monocytes: 11 %
Neutrophils Absolute: 3.5 10*3/uL (ref 1.4–7.0)
Neutrophils: 36 %
Platelets: 225 10*3/uL (ref 150–450)
RBC: 5.18 x10E6/uL (ref 4.14–5.80)
RDW: 13.5 % (ref 11.6–15.4)
WBC: 9.7 10*3/uL (ref 3.4–10.8)

## 2022-10-04 LAB — VITAMIN D 25 HYDROXY (VIT D DEFICIENCY, FRACTURES): Vit D, 25-Hydroxy: 30.6 ng/mL (ref 30.0–100.0)

## 2022-10-04 LAB — LIPID PANEL
Chol/HDL Ratio: 3 ratio (ref 0.0–5.0)
Cholesterol, Total: 116 mg/dL (ref 100–199)
HDL: 39 mg/dL — ABNORMAL LOW (ref >39)
LDL Chol Calc (NIH): 51 mg/dL (ref 0–99)
Triglycerides: 153 mg/dL — ABNORMAL HIGH (ref 0–149)
VLDL Cholesterol Cal: 26 mg/dL (ref 5–40)

## 2022-10-05 ENCOUNTER — Other Ambulatory Visit: Payer: Self-pay | Admitting: Cardiology

## 2022-10-11 ENCOUNTER — Other Ambulatory Visit: Payer: Self-pay | Admitting: Family Medicine

## 2022-10-11 DIAGNOSIS — J302 Other seasonal allergic rhinitis: Secondary | ICD-10-CM

## 2022-10-11 DIAGNOSIS — N481 Balanitis: Secondary | ICD-10-CM

## 2022-10-11 DIAGNOSIS — B002 Herpesviral gingivostomatitis and pharyngotonsillitis: Secondary | ICD-10-CM

## 2022-10-15 ENCOUNTER — Telehealth: Payer: Self-pay | Admitting: Family Medicine

## 2022-11-05 ENCOUNTER — Other Ambulatory Visit: Payer: Self-pay | Admitting: Family Medicine

## 2022-11-05 DIAGNOSIS — K219 Gastro-esophageal reflux disease without esophagitis: Secondary | ICD-10-CM

## 2022-11-14 DIAGNOSIS — E119 Type 2 diabetes mellitus without complications: Secondary | ICD-10-CM | POA: Diagnosis not present

## 2022-11-14 DIAGNOSIS — H35362 Drusen (degenerative) of macula, left eye: Secondary | ICD-10-CM | POA: Diagnosis not present

## 2022-11-14 DIAGNOSIS — H2513 Age-related nuclear cataract, bilateral: Secondary | ICD-10-CM | POA: Diagnosis not present

## 2022-11-14 DIAGNOSIS — H524 Presbyopia: Secondary | ICD-10-CM | POA: Diagnosis not present

## 2022-11-14 DIAGNOSIS — H5203 Hypermetropia, bilateral: Secondary | ICD-10-CM | POA: Diagnosis not present

## 2022-11-14 LAB — HM DIABETES EYE EXAM

## 2022-11-19 ENCOUNTER — Encounter: Payer: Self-pay | Admitting: *Deleted

## 2022-12-02 ENCOUNTER — Other Ambulatory Visit: Payer: Self-pay | Admitting: Family Medicine

## 2022-12-02 DIAGNOSIS — N481 Balanitis: Secondary | ICD-10-CM

## 2022-12-03 NOTE — Telephone Encounter (Signed)
Last OV 10/03/22. Last RF 10/11/22. Next OV 01/09/23

## 2022-12-05 ENCOUNTER — Other Ambulatory Visit: Payer: Self-pay | Admitting: Family Medicine

## 2022-12-05 DIAGNOSIS — J302 Other seasonal allergic rhinitis: Secondary | ICD-10-CM

## 2022-12-10 ENCOUNTER — Other Ambulatory Visit: Payer: Self-pay | Admitting: Family Medicine

## 2023-01-02 ENCOUNTER — Other Ambulatory Visit: Payer: Self-pay | Admitting: Internal Medicine

## 2023-01-04 ENCOUNTER — Ambulatory Visit: Payer: Medicare HMO | Admitting: Family Medicine

## 2023-01-09 ENCOUNTER — Ambulatory Visit (INDEPENDENT_AMBULATORY_CARE_PROVIDER_SITE_OTHER): Payer: Medicare HMO | Admitting: Family Medicine

## 2023-01-09 ENCOUNTER — Encounter: Payer: Self-pay | Admitting: Family Medicine

## 2023-01-09 VITALS — BP 109/73 | HR 63 | Temp 97.8°F | Ht 70.0 in | Wt 233.4 lb

## 2023-01-09 DIAGNOSIS — E1169 Type 2 diabetes mellitus with other specified complication: Secondary | ICD-10-CM | POA: Diagnosis not present

## 2023-01-09 DIAGNOSIS — Z7984 Long term (current) use of oral hypoglycemic drugs: Secondary | ICD-10-CM

## 2023-01-09 DIAGNOSIS — E1165 Type 2 diabetes mellitus with hyperglycemia: Secondary | ICD-10-CM

## 2023-01-09 DIAGNOSIS — I152 Hypertension secondary to endocrine disorders: Secondary | ICD-10-CM

## 2023-01-09 DIAGNOSIS — E785 Hyperlipidemia, unspecified: Secondary | ICD-10-CM

## 2023-01-09 DIAGNOSIS — K219 Gastro-esophageal reflux disease without esophagitis: Secondary | ICD-10-CM | POA: Diagnosis not present

## 2023-01-09 DIAGNOSIS — E1159 Type 2 diabetes mellitus with other circulatory complications: Secondary | ICD-10-CM

## 2023-01-09 LAB — CMP14+EGFR
ALT: 16 IU/L (ref 0–44)
AST: 21 IU/L (ref 0–40)
Albumin/Globulin Ratio: 1.9 (ref 1.2–2.2)
Albumin: 4.3 g/dL (ref 3.9–4.9)
Alkaline Phosphatase: 125 IU/L — ABNORMAL HIGH (ref 44–121)
BUN/Creatinine Ratio: 20 (ref 10–24)
BUN: 17 mg/dL (ref 8–27)
Bilirubin Total: 0.3 mg/dL (ref 0.0–1.2)
CO2: 20 mmol/L (ref 20–29)
Calcium: 8.9 mg/dL (ref 8.6–10.2)
Chloride: 107 mmol/L — ABNORMAL HIGH (ref 96–106)
Creatinine, Ser: 0.83 mg/dL (ref 0.76–1.27)
Globulin, Total: 2.3 g/dL (ref 1.5–4.5)
Glucose: 91 mg/dL (ref 70–99)
Potassium: 4.2 mmol/L (ref 3.5–5.2)
Sodium: 141 mmol/L (ref 134–144)
Total Protein: 6.6 g/dL (ref 6.0–8.5)
eGFR: 97 mL/min/{1.73_m2} (ref >59)

## 2023-01-09 LAB — LIPID PANEL
Chol/HDL Ratio: 2.9 ratio (ref 0.0–5.0)
Cholesterol, Total: 103 mg/dL (ref 100–199)
HDL: 36 mg/dL — ABNORMAL LOW (ref >39)
LDL Chol Calc (NIH): 41 mg/dL (ref 0–99)
Triglycerides: 152 mg/dL — ABNORMAL HIGH (ref 0–149)
VLDL Cholesterol Cal: 26 mg/dL (ref 5–40)

## 2023-01-09 LAB — CBC WITH DIFFERENTIAL/PLATELET
Basophils Absolute: 0.1 10*3/uL (ref 0.0–0.2)
Basos: 1 %
EOS (ABSOLUTE): 0.3 10*3/uL (ref 0.0–0.4)
Eos: 2 %
Hematocrit: 49.1 % (ref 37.5–51.0)
Hemoglobin: 16.9 g/dL (ref 13.0–17.7)
Immature Grans (Abs): 0.2 10*3/uL — ABNORMAL HIGH (ref 0.0–0.1)
Immature Granulocytes: 2 %
Lymphocytes Absolute: 5 10*3/uL — ABNORMAL HIGH (ref 0.7–3.1)
Lymphs: 42 %
MCH: 33.5 pg — ABNORMAL HIGH (ref 26.6–33.0)
MCHC: 34.4 g/dL (ref 31.5–35.7)
MCV: 97 fL (ref 79–97)
Monocytes Absolute: 1.4 10*3/uL — ABNORMAL HIGH (ref 0.1–0.9)
Monocytes: 12 %
Neutrophils Absolute: 4.7 10*3/uL (ref 1.4–7.0)
Neutrophils: 41 %
Platelets: 239 10*3/uL (ref 150–450)
RBC: 5.04 x10E6/uL (ref 4.14–5.80)
RDW: 13.4 % (ref 11.6–15.4)
WBC: 11.6 10*3/uL — ABNORMAL HIGH (ref 3.4–10.8)

## 2023-01-09 LAB — BAYER DCA HB A1C WAIVED: HB A1C (BAYER DCA - WAIVED): 5.6 % (ref 4.8–5.6)

## 2023-01-09 MED ORDER — OMEPRAZOLE 40 MG PO CPDR
40.0000 mg | DELAYED_RELEASE_CAPSULE | Freq: Every day | ORAL | 1 refills | Status: DC
Start: 2023-01-09 — End: 2023-06-05
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
## 2023-01-09 NOTE — Patient Instructions (Addendum)

## 2023-02-05 ENCOUNTER — Other Ambulatory Visit: Payer: Self-pay | Admitting: Cardiology

## 2023-03-06 ENCOUNTER — Other Ambulatory Visit: Payer: Self-pay | Admitting: Family Medicine

## 2023-03-06 DIAGNOSIS — B002 Herpesviral gingivostomatitis and pharyngotonsillitis: Secondary | ICD-10-CM

## 2023-03-06 DIAGNOSIS — J302 Other seasonal allergic rhinitis: Secondary | ICD-10-CM

## 2023-03-28 ENCOUNTER — Other Ambulatory Visit: Payer: Self-pay | Admitting: Family Medicine

## 2023-04-11 ENCOUNTER — Ambulatory Visit (INDEPENDENT_AMBULATORY_CARE_PROVIDER_SITE_OTHER): Payer: Medicare HMO | Admitting: Family Medicine

## 2023-04-11 ENCOUNTER — Encounter: Payer: Self-pay | Admitting: Family Medicine

## 2023-04-11 VITALS — BP 121/78 | HR 62 | Temp 97.8°F | Ht 70.0 in | Wt 242.0 lb

## 2023-04-11 DIAGNOSIS — E1165 Type 2 diabetes mellitus with hyperglycemia: Secondary | ICD-10-CM

## 2023-04-11 DIAGNOSIS — I152 Hypertension secondary to endocrine disorders: Secondary | ICD-10-CM

## 2023-04-11 DIAGNOSIS — Z7985 Long-term (current) use of injectable non-insulin antidiabetic drugs: Secondary | ICD-10-CM

## 2023-04-11 DIAGNOSIS — I25119 Atherosclerotic heart disease of native coronary artery with unspecified angina pectoris: Secondary | ICD-10-CM

## 2023-04-11 DIAGNOSIS — E785 Hyperlipidemia, unspecified: Secondary | ICD-10-CM

## 2023-04-11 DIAGNOSIS — M62830 Muscle spasm of back: Secondary | ICD-10-CM

## 2023-04-11 DIAGNOSIS — E1169 Type 2 diabetes mellitus with other specified complication: Secondary | ICD-10-CM

## 2023-04-11 DIAGNOSIS — E1159 Type 2 diabetes mellitus with other circulatory complications: Secondary | ICD-10-CM | POA: Diagnosis not present

## 2023-04-11 DIAGNOSIS — I700 Atherosclerosis of aorta: Secondary | ICD-10-CM

## 2023-04-11 DIAGNOSIS — J449 Chronic obstructive pulmonary disease, unspecified: Secondary | ICD-10-CM | POA: Diagnosis not present

## 2023-04-11 LAB — BAYER DCA HB A1C WAIVED: HB A1C (BAYER DCA - WAIVED): 6.9 % — ABNORMAL HIGH (ref 4.8–5.6)

## 2023-04-11 MED ORDER — CYCLOBENZAPRINE HCL 10 MG PO TABS
10.0000 mg | ORAL_TABLET | Freq: Three times a day (TID) | ORAL | 0 refills | Status: DC | PRN
Start: 2023-04-11 — End: 2023-06-19

## 2023-04-11 NOTE — Patient Instructions (Addendum)
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
## 2023-04-12 LAB — CBC WITH DIFFERENTIAL/PLATELET
Basophils Absolute: 0.1 10*3/uL (ref 0.0–0.2)
Basos: 1 %
EOS (ABSOLUTE): 0.3 10*3/uL (ref 0.0–0.4)
Eos: 2 %
Hematocrit: 48.5 % (ref 37.5–51.0)
Hemoglobin: 16.4 g/dL (ref 13.0–17.7)
Immature Grans (Abs): 0.1 10*3/uL (ref 0.0–0.1)
Immature Granulocytes: 1 %
Lymphocytes Absolute: 7.8 10*3/uL — ABNORMAL HIGH (ref 0.7–3.1)
Lymphs: 58 %
MCH: 32.8 pg (ref 26.6–33.0)
MCHC: 33.8 g/dL (ref 31.5–35.7)
MCV: 97 fL (ref 79–97)
Monocytes Absolute: 1.3 10*3/uL — ABNORMAL HIGH (ref 0.1–0.9)
Monocytes: 10 %
Neutrophils Absolute: 3.7 10*3/uL (ref 1.4–7.0)
Neutrophils: 28 %
Platelets: 213 10*3/uL (ref 150–450)
RBC: 5 x10E6/uL (ref 4.14–5.80)
RDW: 13 % (ref 11.6–15.4)
WBC: 13.2 10*3/uL — ABNORMAL HIGH (ref 3.4–10.8)

## 2023-04-12 LAB — CMP14+EGFR
ALT: 22 IU/L (ref 0–44)
AST: 24 IU/L (ref 0–40)
Albumin: 3.9 g/dL (ref 3.9–4.9)
Alkaline Phosphatase: 132 IU/L — ABNORMAL HIGH (ref 44–121)
BUN/Creatinine Ratio: 23 (ref 10–24)
BUN: 19 mg/dL (ref 8–27)
Bilirubin Total: 0.4 mg/dL (ref 0.0–1.2)
CO2: 20 mmol/L (ref 20–29)
Calcium: 9.1 mg/dL (ref 8.6–10.2)
Chloride: 103 mmol/L (ref 96–106)
Creatinine, Ser: 0.83 mg/dL (ref 0.76–1.27)
Globulin, Total: 2.3 g/dL (ref 1.5–4.5)
Glucose: 129 mg/dL — ABNORMAL HIGH (ref 70–99)
Potassium: 4.3 mmol/L (ref 3.5–5.2)
Sodium: 139 mmol/L (ref 134–144)
Total Protein: 6.2 g/dL (ref 6.0–8.5)
eGFR: 97 mL/min/{1.73_m2} (ref >59)

## 2023-04-12 LAB — LIPID PANEL
Chol/HDL Ratio: 3 ratio (ref 0.0–5.0)
Cholesterol, Total: 112 mg/dL (ref 100–199)
HDL: 37 mg/dL — ABNORMAL LOW (ref >39)
LDL Chol Calc (NIH): 49 mg/dL (ref 0–99)
Triglycerides: 151 mg/dL — ABNORMAL HIGH (ref 0–149)
VLDL Cholesterol Cal: 26 mg/dL (ref 5–40)

## 2023-05-17 ENCOUNTER — Other Ambulatory Visit: Payer: Self-pay | Admitting: Family Medicine

## 2023-05-17 DIAGNOSIS — Z1212 Encounter for screening for malignant neoplasm of rectum: Secondary | ICD-10-CM

## 2023-05-17 DIAGNOSIS — Z1211 Encounter for screening for malignant neoplasm of colon: Secondary | ICD-10-CM

## 2023-05-18 ENCOUNTER — Other Ambulatory Visit: Payer: Self-pay | Admitting: Family Medicine

## 2023-05-18 DIAGNOSIS — B002 Herpesviral gingivostomatitis and pharyngotonsillitis: Secondary | ICD-10-CM

## 2023-06-02 ENCOUNTER — Other Ambulatory Visit: Payer: Self-pay | Admitting: Cardiology

## 2023-06-05 ENCOUNTER — Other Ambulatory Visit: Payer: Self-pay | Admitting: Family Medicine

## 2023-06-05 DIAGNOSIS — K219 Gastro-esophageal reflux disease without esophagitis: Secondary | ICD-10-CM

## 2023-06-16 NOTE — Progress Notes (Unsigned)
  Cardiology Office Note:   Date:  06/19/2023  ID:  [name], DOB 10/02/1955, MRN [id number] PCP: Sonny Masters, FNP  [facility] Providers Cardiologist:  Rollene Rotunda, MD {  History of Present Illness:   [name] is a 67 y.o. male who presents for followup of his known coronary disease.  He had unstable angina and he had a cardiac cath  08/19/2017 which revealed severe stenosis of the mid LAD and bifurcation of the 2.0 mm diagonal branch.   The patient had subsequent successful PTCA of the diagonal branch ostium, successful PTCA/DES x1 to the mid LAD.  Patient also was found to have severe stenosis of the distal RCA/posterior lateral artery with successful PTCA/drug-eluting stent x1 to the distal RCA and posterior lateral artery.   Since I last saw him he has done well.  The patient denies any new symptoms such as chest discomfort, neck or arm discomfort. There has been no new shortness of breath, PND or orthopnea. There have been no reported palpitations, presyncope or syncope.   He is active in his yard.    ROS: As stated in the HPI and negative for all other systems.  Studies Reviewed:    EKG:   EKG Interpretation Date/Time:  Wednesday June 19 2023 09:37:52 EST Ventricular Rate:  65 PR Interval:  182 QRS Duration:  114 QT Interval:  418 QTC Calculation: 434 R Axis:   120  Text Interpretation: Normal sinus rhythm Right axis deviation Low voltage QRS When compared with ECG of 20-Aug-2017 04:10, No significant change since last tracing Confirmed by Rollene Rotunda ([id number]) on 06/19/2023 9:48:38 AM    Risk Assessment/Calculations:              Physical Exam:   VS:  BP 112/72   Pulse 65   Ht 5\' 10"  (1.778 m)   Wt 250 lb (113.4 kg)   BMI 35.87 kg/m    Wt Readings from Last 3 Encounters:  06/19/23 250 lb (113.4 kg)  04/11/23 242 lb (109.8 kg)  01/09/23 233 lb 6.4 oz (105.9 kg)     GEN: Well nourished, well developed in no acute distress NECK: No JVD;  No carotid bruits CARDIAC: RRR, no murmurs, rubs, gallops RESPIRATORY:  Clear to auscultation without rales, wheezing or rhonchi  ABDOMEN: Soft, non-tender, non-distended EXTREMITIES:  No edema; No deformity   ASSESSMENT AND PLAN:   CAD:   The patient has no new sypmtoms.  No further cardiovascular testing is indicated.  We will continue with aggressive risk reduction and meds as listed.   HTN:  The blood pressure is well controlled.  No change in therapy.    HYPERLIPIDEMIA:  His last LDL was 49 with HDL of 37.  No change in therapy.    DM: His A1c is 6.9.  No change in therapy.         Follow up with me in 12 months.   Signed, Rollene Rotunda, MD

## 2023-06-19 ENCOUNTER — Ambulatory Visit: Payer: Medicare HMO | Admitting: Cardiology

## 2023-06-19 ENCOUNTER — Encounter: Payer: Self-pay | Admitting: Cardiology

## 2023-06-19 VITALS — BP 112/72 | HR 65 | Ht 70.0 in | Wt 250.0 lb

## 2023-06-19 DIAGNOSIS — E785 Hyperlipidemia, unspecified: Secondary | ICD-10-CM | POA: Diagnosis not present

## 2023-06-19 DIAGNOSIS — E118 Type 2 diabetes mellitus with unspecified complications: Secondary | ICD-10-CM

## 2023-06-19 DIAGNOSIS — I2510 Atherosclerotic heart disease of native coronary artery without angina pectoris: Secondary | ICD-10-CM | POA: Diagnosis not present

## 2023-06-19 DIAGNOSIS — I10 Essential (primary) hypertension: Secondary | ICD-10-CM

## 2023-06-19 MED ORDER — ROSUVASTATIN CALCIUM 40 MG PO TABS: 40.0000 mg | ORAL_TABLET | Freq: Every day | ORAL | 3 refills | Status: DC

## 2023-06-19 NOTE — Patient Instructions (Signed)

## 2023-06-19 NOTE — Addendum Note (Signed)
Addended by: Sharin Grave on: 06/19/2023 10:03 AM   Modules accepted: Orders

## 2023-07-15 ENCOUNTER — Other Ambulatory Visit: Payer: Self-pay | Admitting: Cardiology

## 2023-07-23 ENCOUNTER — Encounter: Payer: Self-pay | Admitting: Family Medicine

## 2023-07-23 ENCOUNTER — Telehealth (INDEPENDENT_AMBULATORY_CARE_PROVIDER_SITE_OTHER): Payer: Medicare HMO | Admitting: Family Medicine

## 2023-07-23 NOTE — Progress Notes (Signed)
Unable to connect to video, will reschedule.

## 2023-07-29 ENCOUNTER — Other Ambulatory Visit: Payer: Self-pay | Admitting: Family Medicine

## 2023-08-01 ENCOUNTER — Ambulatory Visit: Payer: Medicare HMO | Admitting: Family Medicine

## 2023-08-01 ENCOUNTER — Encounter: Payer: Self-pay | Admitting: Family Medicine

## 2023-08-01 VITALS — BP 129/87 | HR 61 | Temp 97.5°F | Ht 70.0 in | Wt 251.6 lb

## 2023-08-01 DIAGNOSIS — E1159 Type 2 diabetes mellitus with other circulatory complications: Secondary | ICD-10-CM | POA: Diagnosis not present

## 2023-08-01 DIAGNOSIS — K219 Gastro-esophageal reflux disease without esophagitis: Secondary | ICD-10-CM | POA: Diagnosis not present

## 2023-08-01 DIAGNOSIS — I700 Atherosclerosis of aorta: Secondary | ICD-10-CM | POA: Diagnosis not present

## 2023-08-01 DIAGNOSIS — E1169 Type 2 diabetes mellitus with other specified complication: Secondary | ICD-10-CM | POA: Diagnosis not present

## 2023-08-01 DIAGNOSIS — J449 Chronic obstructive pulmonary disease, unspecified: Secondary | ICD-10-CM

## 2023-08-01 DIAGNOSIS — B002 Herpesviral gingivostomatitis and pharyngotonsillitis: Secondary | ICD-10-CM

## 2023-08-01 DIAGNOSIS — J302 Other seasonal allergic rhinitis: Secondary | ICD-10-CM

## 2023-08-01 DIAGNOSIS — E1165 Type 2 diabetes mellitus with hyperglycemia: Secondary | ICD-10-CM | POA: Diagnosis not present

## 2023-08-01 DIAGNOSIS — K760 Fatty (change of) liver, not elsewhere classified: Secondary | ICD-10-CM | POA: Diagnosis not present

## 2023-08-01 DIAGNOSIS — Z23 Encounter for immunization: Secondary | ICD-10-CM | POA: Diagnosis not present

## 2023-08-01 DIAGNOSIS — I25119 Atherosclerotic heart disease of native coronary artery with unspecified angina pectoris: Secondary | ICD-10-CM

## 2023-08-01 DIAGNOSIS — E785 Hyperlipidemia, unspecified: Secondary | ICD-10-CM | POA: Diagnosis not present

## 2023-08-01 DIAGNOSIS — R748 Abnormal levels of other serum enzymes: Secondary | ICD-10-CM | POA: Diagnosis not present

## 2023-08-01 DIAGNOSIS — I152 Hypertension secondary to endocrine disorders: Secondary | ICD-10-CM

## 2023-08-01 LAB — BAYER DCA HB A1C WAIVED: HB A1C (BAYER DCA - WAIVED): 7.2 % — ABNORMAL HIGH (ref 4.8–5.6)

## 2023-08-01 MED ORDER — TIRZEPATIDE 12.5 MG/0.5ML ~~LOC~~ SOAJ: 12.5000 mg | SUBCUTANEOUS | 0 refills | Status: DC

## 2023-08-01 MED ORDER — LEVOCETIRIZINE DIHYDROCHLORIDE 5 MG PO TABS: 5.0000 mg | ORAL_TABLET | Freq: Every evening | ORAL | 1 refills | Status: DC

## 2023-08-01 MED ORDER — VALACYCLOVIR HCL 500 MG PO TABS: 500.0000 mg | ORAL_TABLET | Freq: Every day | ORAL | 1 refills | Status: DC

## 2023-08-01 MED ORDER — OMEPRAZOLE 40 MG PO CPDR: 40.0000 mg | DELAYED_RELEASE_CAPSULE | Freq: Every day | ORAL | 1 refills | Status: DC
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
## 2023-08-01 NOTE — Patient Instructions (Signed)

## 2023-08-02 LAB — CMP14+EGFR
ALT: 24 [IU]/L (ref 0–44)
AST: 20 [IU]/L (ref 0–40)
Albumin: 4.2 g/dL (ref 3.9–4.9)
Alkaline Phosphatase: 146 [IU]/L — ABNORMAL HIGH (ref 44–121)
BUN/Creatinine Ratio: 18 (ref 10–24)
BUN: 15 mg/dL (ref 8–27)
Bilirubin Total: 0.6 mg/dL (ref 0.0–1.2)
CO2: 21 mmol/L (ref 20–29)
Calcium: 9.1 mg/dL (ref 8.6–10.2)
Chloride: 102 mmol/L (ref 96–106)
Creatinine, Ser: 0.82 mg/dL (ref 0.76–1.27)
Globulin, Total: 2.6 g/dL (ref 1.5–4.5)
Glucose: 245 mg/dL — ABNORMAL HIGH (ref 70–99)
Potassium: 4.4 mmol/L (ref 3.5–5.2)
Sodium: 139 mmol/L (ref 134–144)
Total Protein: 6.8 g/dL (ref 6.0–8.5)
eGFR: 97 mL/min/{1.73_m2} (ref >59)

## 2023-08-02 LAB — CBC WITH DIFFERENTIAL/PLATELET
Basophils Absolute: 0.1 10*3/uL (ref 0.0–0.2)
Basos: 1 %
EOS (ABSOLUTE): 0.2 10*3/uL (ref 0.0–0.4)
Eos: 2 %
Hematocrit: 51.9 % — ABNORMAL HIGH (ref 37.5–51.0)
Hemoglobin: 17.5 g/dL (ref 13.0–17.7)
Immature Grans (Abs): 0.1 10*3/uL (ref 0.0–0.1)
Immature Granulocytes: 1 %
Lymphocytes Absolute: 6 10*3/uL — ABNORMAL HIGH (ref 0.7–3.1)
Lymphs: 55 %
MCH: 33.3 pg — ABNORMAL HIGH (ref 26.6–33.0)
MCHC: 33.7 g/dL (ref 31.5–35.7)
MCV: 99 fL — ABNORMAL HIGH (ref 79–97)
Monocytes Absolute: 1.2 10*3/uL — ABNORMAL HIGH (ref 0.1–0.9)
Monocytes: 11 %
Neutrophils Absolute: 3.3 10*3/uL (ref 1.4–7.0)
Neutrophils: 30 %
Platelets: 226 10*3/uL (ref 150–450)
RBC: 5.26 x10E6/uL (ref 4.14–5.80)
RDW: 12.9 % (ref 11.6–15.4)
WBC: 10.9 10*3/uL — ABNORMAL HIGH (ref 3.4–10.8)

## 2023-08-02 LAB — LIPID PANEL
Chol/HDL Ratio: 3.5 {ratio} (ref 0.0–5.0)
Cholesterol, Total: 156 mg/dL (ref 100–199)
HDL: 44 mg/dL (ref >39)
LDL Chol Calc (NIH): 75 mg/dL (ref 0–99)
Triglycerides: 221 mg/dL — ABNORMAL HIGH (ref 0–149)
VLDL Cholesterol Cal: 37 mg/dL (ref 5–40)

## 2023-08-21 LAB — ALKALINE PHOSPHATASE, ISOENZYMES
Alkaline Phosphatase: 143 [IU]/L — ABNORMAL HIGH (ref 44–121)
BONE FRACTION: 70 % — ABNORMAL HIGH (ref 12–68)
INTESTINAL FRAC.: 2 % (ref 0–18)
LIVER FRACTION: 28 % (ref 13–88)

## 2023-08-21 LAB — SPECIMEN STATUS REPORT

## 2023-08-27 ENCOUNTER — Other Ambulatory Visit: Payer: Self-pay | Admitting: Family Medicine

## 2023-08-27 DIAGNOSIS — E1165 Type 2 diabetes mellitus with hyperglycemia: Secondary | ICD-10-CM

## 2023-08-27 MED ORDER — TIRZEPATIDE 12.5 MG/0.5ML ~~LOC~~ SOAJ: 12.5000 mg | SUBCUTANEOUS | 1 refills | Status: DC

## 2023-08-27 NOTE — Telephone Encounter (Signed)
 Copied from CRM [id number]. Topic: Clinical - Medication Refill >> Aug 27, 2023  8:29 AM Benton KIDD wrote: Most Recent Primary Care Visit:  Provider: SEVERA ROCK HERO  Department: [facility]  Visit Type: OFFICE VISIT  Date: 08/01/2023  Medication: ***  Has the patient contacted their pharmacy?  (Agent: If no, request that the patient contact the pharmacy for the refill. If patient does not wish to contact the pharmacy document the reason why and proceed with request.) (Agent: If yes, when and what did the pharmacy advise?)  Is this the correct pharmacy for this prescription?  If no, delete pharmacy and type the correct one.  This is the patient's preferred pharmacy:  [id number] - [place],  - [address] [place] [address] Phone: [phone] Fax: [phone]  [facility] - [place], [place] - [address] [place] [address] Phone: [phone] Fax: [phone]   Has the prescription been filled recently?   Is the patient out of the medication?   Has the patient been seen for an appointment in the last year OR does the patient have an upcoming appointment?   Can we respond through MyChart?   Agent: Please be advised that Rx refills may take up to 3 business days. We ask that you follow-up with your pharmacy.

## 2023-09-06 ENCOUNTER — Other Ambulatory Visit: Payer: Self-pay | Admitting: Family Medicine

## 2023-09-06 DIAGNOSIS — E1165 Type 2 diabetes mellitus with hyperglycemia: Secondary | ICD-10-CM

## 2023-10-14 ENCOUNTER — Other Ambulatory Visit: Payer: Self-pay | Admitting: Family Medicine

## 2023-10-14 DIAGNOSIS — N481 Balanitis: Secondary | ICD-10-CM

## 2023-10-14 DIAGNOSIS — J302 Other seasonal allergic rhinitis: Secondary | ICD-10-CM

## 2023-11-01 ENCOUNTER — Ambulatory Visit: Payer: Medicare HMO | Admitting: Family Medicine

## 2023-11-01 ENCOUNTER — Encounter: Payer: Self-pay | Admitting: Family Medicine

## 2023-11-01 VITALS — BP 134/88 | HR 64 | Temp 97.3°F | Ht 70.0 in | Wt 246.2 lb

## 2023-11-01 DIAGNOSIS — E1159 Type 2 diabetes mellitus with other circulatory complications: Secondary | ICD-10-CM

## 2023-11-01 DIAGNOSIS — E1165 Type 2 diabetes mellitus with hyperglycemia: Secondary | ICD-10-CM | POA: Diagnosis not present

## 2023-11-01 DIAGNOSIS — I152 Hypertension secondary to endocrine disorders: Secondary | ICD-10-CM

## 2023-11-01 DIAGNOSIS — I700 Atherosclerosis of aorta: Secondary | ICD-10-CM | POA: Diagnosis not present

## 2023-11-01 DIAGNOSIS — J302 Other seasonal allergic rhinitis: Secondary | ICD-10-CM

## 2023-11-01 DIAGNOSIS — J449 Chronic obstructive pulmonary disease, unspecified: Secondary | ICD-10-CM | POA: Diagnosis not present

## 2023-11-01 DIAGNOSIS — Z7985 Long-term (current) use of injectable non-insulin antidiabetic drugs: Secondary | ICD-10-CM

## 2023-11-01 DIAGNOSIS — E1169 Type 2 diabetes mellitus with other specified complication: Secondary | ICD-10-CM

## 2023-11-01 DIAGNOSIS — E785 Hyperlipidemia, unspecified: Secondary | ICD-10-CM

## 2023-11-01 DIAGNOSIS — I25119 Atherosclerotic heart disease of native coronary artery with unspecified angina pectoris: Secondary | ICD-10-CM | POA: Diagnosis not present

## 2023-11-01 LAB — BAYER DCA HB A1C WAIVED: HB A1C (BAYER DCA - WAIVED): 6 % — ABNORMAL HIGH (ref 4.8–5.6)

## 2023-11-01 MED ORDER — MOUNJARO 12.5 MG/0.5ML ~~LOC~~ SOAJ: 12.5000 mg | SUBCUTANEOUS | 3 refills | Status: DC
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
## 2023-11-01 NOTE — Patient Instructions (Signed)

## 2023-11-02 LAB — LIPID PANEL
Chol/HDL Ratio: 3.3 ratio (ref 0.0–5.0)
Cholesterol, Total: 111 mg/dL (ref 100–199)
HDL: 34 mg/dL — ABNORMAL LOW (ref >39)
LDL Chol Calc (NIH): 45 mg/dL (ref 0–99)
Triglycerides: 197 mg/dL — ABNORMAL HIGH (ref 0–149)
VLDL Cholesterol Cal: 32 mg/dL (ref 5–40)

## 2023-11-02 LAB — CBC WITH DIFFERENTIAL/PLATELET
Basophils Absolute: 0.1 10*3/uL (ref 0.0–0.2)
Basos: 1 %
EOS (ABSOLUTE): 0.2 10*3/uL (ref 0.0–0.4)
Eos: 2 %
Hematocrit: 51 % (ref 37.5–51.0)
Hemoglobin: 17.7 g/dL (ref 13.0–17.7)
Immature Grans (Abs): 0.1 10*3/uL (ref 0.0–0.1)
Immature Granulocytes: 1 %
Lymphocytes Absolute: 5.4 10*3/uL — ABNORMAL HIGH (ref 0.7–3.1)
Lymphs: 52 %
MCH: 34.4 pg — ABNORMAL HIGH (ref 26.6–33.0)
MCHC: 34.7 g/dL (ref 31.5–35.7)
MCV: 99 fL — ABNORMAL HIGH (ref 79–97)
Monocytes Absolute: 1.2 10*3/uL — ABNORMAL HIGH (ref 0.1–0.9)
Monocytes: 12 %
Neutrophils Absolute: 3.2 10*3/uL (ref 1.4–7.0)
Neutrophils: 32 %
Platelets: 199 10*3/uL (ref 150–450)
RBC: 5.15 x10E6/uL (ref 4.14–5.80)
RDW: 13.9 % (ref 11.6–15.4)
WBC: 10.1 10*3/uL (ref 3.4–10.8)

## 2023-11-02 LAB — CMP14+EGFR
ALT: 20 IU/L (ref 0–44)
AST: 23 IU/L (ref 0–40)
Albumin: 4.2 g/dL (ref 3.9–4.9)
Alkaline Phosphatase: 102 IU/L (ref 44–121)
BUN/Creatinine Ratio: 22 (ref 10–24)
BUN: 19 mg/dL (ref 8–27)
Bilirubin Total: 0.4 mg/dL (ref 0.0–1.2)
CO2: 19 mmol/L — ABNORMAL LOW (ref 20–29)
Calcium: 9.2 mg/dL (ref 8.6–10.2)
Chloride: 107 mmol/L — ABNORMAL HIGH (ref 96–106)
Creatinine, Ser: 0.86 mg/dL (ref 0.76–1.27)
Globulin, Total: 2.2 g/dL (ref 1.5–4.5)
Glucose: 133 mg/dL — ABNORMAL HIGH (ref 70–99)
Potassium: 4.3 mmol/L (ref 3.5–5.2)
Sodium: 143 mmol/L (ref 134–144)
Total Protein: 6.4 g/dL (ref 6.0–8.5)
eGFR: 95 mL/min/{1.73_m2} (ref >59)

## 2023-11-03 LAB — MICROALBUMIN / CREATININE URINE RATIO
Creatinine, Urine: 158.5 mg/dL
Microalb/Creat Ratio: 9 mg/g{creat} (ref 0–29)
Microalbumin, Urine: 14.6 ug/mL

## 2023-11-04 ENCOUNTER — Other Ambulatory Visit: Payer: Self-pay | Admitting: Cardiology

## 2023-11-26 ENCOUNTER — Telehealth: Payer: Self-pay

## 2023-11-26 ENCOUNTER — Other Ambulatory Visit: Payer: Self-pay | Admitting: Family Medicine

## 2023-11-26 DIAGNOSIS — J302 Other seasonal allergic rhinitis: Secondary | ICD-10-CM

## 2023-11-26 MED ORDER — CETIRIZINE HCL 10 MG PO CAPS: 10.0000 mg | ORAL_CAPSULE | Freq: Every evening | ORAL | 2 refills | Status: DC

## 2023-11-26 NOTE — Telephone Encounter (Signed)
 Copied from CRM [id number]. Topic: Clinical - Prescription Issue >> Nov 26, 2023 11:15 AM Donald Frost wrote: Reason for CRM: The patient called in stating his provider told him to call back if his allergy medicine was not working anymore. He takes levocetirizine (XYZAL) 5 MG tablet and says it no longer works for him. Can something else be called into his mail order pharmacy? Please let him know when it is sent  [facility] - [place], [place] - [address] Phone: [phone] Fax: [phone]  Please assist patient further

## 2023-11-26 NOTE — Telephone Encounter (Signed)
 Patient aware.

## 2023-11-26 NOTE — Telephone Encounter (Signed)
 Please review and advise.

## 2023-12-03 DIAGNOSIS — H524 Presbyopia: Secondary | ICD-10-CM | POA: Diagnosis not present

## 2023-12-03 LAB — OPHTHALMOLOGY REPORT-SCANNED

## 2023-12-25 ENCOUNTER — Other Ambulatory Visit: Payer: Self-pay | Admitting: Family Medicine

## 2023-12-28 ENCOUNTER — Other Ambulatory Visit: Payer: Self-pay | Admitting: Family Medicine

## 2023-12-28 DIAGNOSIS — B002 Herpesviral gingivostomatitis and pharyngotonsillitis: Secondary | ICD-10-CM

## 2024-02-11 ENCOUNTER — Ambulatory Visit: Admitting: Family Medicine

## 2024-02-19 ENCOUNTER — Ambulatory Visit: Admitting: Family Medicine

## 2024-02-19 ENCOUNTER — Encounter: Payer: Self-pay | Admitting: Family Medicine

## 2024-02-19 ENCOUNTER — Ambulatory Visit: Payer: Self-pay | Admitting: Family Medicine

## 2024-02-19 VITALS — BP 119/74 | HR 86 | Temp 97.7°F | Resp 18 | Ht 70.0 in | Wt 242.0 lb

## 2024-02-19 DIAGNOSIS — J449 Chronic obstructive pulmonary disease, unspecified: Secondary | ICD-10-CM

## 2024-02-19 DIAGNOSIS — K760 Fatty (change of) liver, not elsewhere classified: Secondary | ICD-10-CM

## 2024-02-19 DIAGNOSIS — E1169 Type 2 diabetes mellitus with other specified complication: Secondary | ICD-10-CM | POA: Diagnosis not present

## 2024-02-19 DIAGNOSIS — K219 Gastro-esophageal reflux disease without esophagitis: Secondary | ICD-10-CM | POA: Diagnosis not present

## 2024-02-19 DIAGNOSIS — E1165 Type 2 diabetes mellitus with hyperglycemia: Secondary | ICD-10-CM

## 2024-02-19 DIAGNOSIS — I152 Hypertension secondary to endocrine disorders: Secondary | ICD-10-CM | POA: Diagnosis not present

## 2024-02-19 DIAGNOSIS — E785 Hyperlipidemia, unspecified: Secondary | ICD-10-CM

## 2024-02-19 DIAGNOSIS — E1140 Type 2 diabetes mellitus with diabetic neuropathy, unspecified: Secondary | ICD-10-CM | POA: Diagnosis not present

## 2024-02-19 DIAGNOSIS — J302 Other seasonal allergic rhinitis: Secondary | ICD-10-CM

## 2024-02-19 DIAGNOSIS — E1159 Type 2 diabetes mellitus with other circulatory complications: Secondary | ICD-10-CM | POA: Diagnosis not present

## 2024-02-19 DIAGNOSIS — I2510 Atherosclerotic heart disease of native coronary artery without angina pectoris: Secondary | ICD-10-CM

## 2024-02-19 DIAGNOSIS — R110 Nausea: Secondary | ICD-10-CM

## 2024-02-19 LAB — BAYER DCA HB A1C WAIVED: HB A1C (BAYER DCA - WAIVED): 5.9 % — ABNORMAL HIGH (ref 4.8–5.6)

## 2024-02-19 LAB — LIPID PANEL

## 2024-02-19 MED ORDER — ONDANSETRON HCL 8 MG PO TABS: 8.0000 mg | ORAL_TABLET | Freq: Three times a day (TID) | ORAL | 2 refills | Status: AC | PRN

## 2024-02-19 MED ORDER — OMEPRAZOLE 40 MG PO CPDR: 40.0000 mg | DELAYED_RELEASE_CAPSULE | Freq: Every day | ORAL | 1 refills | Status: AC

## 2024-02-19 MED ORDER — CETIRIZINE HCL 10 MG PO CAPS: 10.0000 mg | ORAL_CAPSULE | Freq: Every evening | ORAL | 2 refills | Status: AC

## 2024-02-19 NOTE — Patient Instructions (Addendum)
 Qunol CoQ10 Nervive   Continue to monitor your blood sugars as we discussed and record them. Bring the log to your next appointment.  Take your medications as directed.    Goal Blood glucose:    Fasting (before meals) = 80 to 130   Within 2 hours of eating = less than 180   Understanding your Hemoglobin A1c:5.9     Diabetes Mellitus and Nutrition    I think that you would greatly benefit from seeing a nutritionist. If this is something you are interested in, please call Dr Wonda at [phone] to schedule an appointment.   When you have diabetes (diabetes mellitus), it is very important to have healthy eating habits because your blood sugar (glucose) levels are greatly affected by what you eat and drink. Eating healthy foods in the appropriate amounts, at about the same times every day, can help you: Control your blood glucose. Lower your risk of heart disease. Improve your blood pressure. Reach or maintain a healthy weight.  Every person with diabetes is different, and each person has different needs for a meal plan. Your health care provider may recommend that you work with a diet and nutrition specialist (dietitian) to make a meal plan that is best for you. Your meal plan may vary depending on factors such as: The calories you need. The medicines you take. Your weight. Your blood glucose, blood pressure, and cholesterol levels. Your activity level. Other health conditions you have, such as heart or kidney disease.  How do carbohydrates affect me? Carbohydrates affect your blood glucose level more than any other type of food. Eating carbohydrates naturally increases the amount of glucose in your blood. Carbohydrate counting is a method for keeping track of how many carbohydrates you eat. Counting carbohydrates is important to keep your blood glucose at a healthy level, especially if you use insulin  or take certain oral diabetes medicines. It is important to know how many  carbohydrates you can safely have in each meal. This is different for every person. Your dietitian can help you calculate how many carbohydrates you should have at each meal and for snack. Foods that contain carbohydrates include: Bread, cereal, rice, pasta, and crackers. Potatoes and corn. Peas, beans, and lentils. Milk and yogurt. Fruit and juice. Desserts, such as cakes, cookies, ice cream, and candy.  How does alcohol affect me? Alcohol can cause a sudden decrease in blood glucose (hypoglycemia), especially if you use insulin  or take certain oral diabetes medicines. Hypoglycemia can be a life-threatening condition. Symptoms of hypoglycemia (sleepiness, dizziness, and confusion) are similar to symptoms of having too much alcohol. If your health care provider says that alcohol is safe for you, follow these guidelines: Limit alcohol intake to no more than 1 drink per day for nonpregnant women and 2 drinks per day for men. One drink equals 12 oz of beer, 5 oz of wine, or 1 oz of hard liquor. Do not drink on an empty stomach. Keep yourself hydrated with water, diet soda, or unsweetened iced tea. Keep in mind that regular soda, juice, and other mixers may contain a lot of sugar and must be counted as carbohydrates.  What are tips for following this plan?  Reading food labels Start by checking the serving size on the label. The amount of calories, carbohydrates, fats, and other nutrients listed on the label are based on one serving of the food. Many foods contain more than one serving per package. Check the total grams (g) of carbohydrates in one  serving. You can calculate the number of servings of carbohydrates in one serving by dividing the total carbohydrates by 15. For example, if a food has 30 g of total carbohydrates, it would be equal to 2 servings of carbohydrates. Check the number of grams (g) of saturated and trans fats in one serving. Choose foods that have low or no amount of these  fats. Check the number of milligrams (mg) of sodium in one serving. Most people should limit total sodium intake to less than 2,300 mg per day. Always check the nutrition information of foods labeled as low-fat or nonfat. These foods may be higher in added sugar or refined carbohydrates and should be avoided. Talk to your dietitian to identify your daily goals for nutrients listed on the label.  Shopping Avoid buying canned, premade, or processed foods. These foods tend to be high in fat, sodium, and added sugar. Shop around the outside edge of the grocery store. This includes fresh fruits and vegetables, bulk grains, fresh meats, and fresh dairy.  Cooking Use low-heat cooking methods, such as baking, instead of high-heat cooking methods like deep frying. Cook using healthy oils, such as olive, canola, or sunflower oil. Avoid cooking with butter, cream, or high-fat meats.  Meal planning Eat meals and snacks regularly, preferably at the same times every day. Avoid going long periods of time without eating. Eat foods high in fiber, such as fresh fruits, vegetables, beans, and whole grains. Talk to your dietitian about how many servings of carbohydrates you can eat at each meal. Eat 4-6 ounces of lean protein each day, such as lean meat, chicken, fish, eggs, or tofu. 1 ounce is equal to 1 ounce of meat, chicken, or fish, 1 egg, or 1/4 cup of tofu. Eat some foods each day that contain healthy fats, such as avocado, nuts, seeds, and fish.  Lifestyle  Check your blood glucose regularly. Exercise at least 30 minutes 5 or more days each week, or as told by your health care provider. Take medicines as told by your health care provider. Do not use any products that contain nicotine or tobacco, such as cigarettes and e-cigarettes. If you need help quitting, ask your health care provider. Work with a Veterinary surgeon or diabetes educator to identify strategies to manage stress and any emotional and social  challenges.  What are some questions to ask my health care provider? Do I need to meet with a diabetes educator? Do I need to meet with a dietitian? What number can I call if I have questions? When are the best times to check my blood glucose?  Where to find more information: American Diabetes Association: diabetes.org/food-and-fitness/food Academy of Nutrition and Dietetics: https://www.vargas.com/ [employer] of Diabetes and Digestive and Kidney Diseases (NIH): FindJewelers.cz  Summary A healthy meal plan will help you control your blood glucose and maintain a healthy lifestyle. Working with a diet and nutrition specialist (dietitian) can help you make a meal plan that is best for you. Keep in mind that carbohydrates and alcohol have immediate effects on your blood glucose levels. It is important to count carbohydrates and to use alcohol carefully. This information is not intended to replace advice given to you by your health care provider. Make sure you discuss any questions you have with your health care provider. Document Released: 04/26/2005 Document Revised: 09/03/2016 Document Reviewed: 09/03/2016 Elsevier Interactive Patient Education  [employer].
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
## 2024-02-20 LAB — LIPID PANEL
Cholesterol, Total: 107 mg/dL (ref 100–199)
HDL: 39 mg/dL — AB (ref >39)
LDL CALC COMMENT:: 2.7 ratio (ref 0.0–5.0)
LDL Chol Calc (NIH): 44 mg/dL (ref 0–99)
Triglycerides: 141 mg/dL (ref 0–149)
VLDL Cholesterol Cal: 24 mg/dL (ref 5–40)

## 2024-02-20 LAB — CBC WITH DIFFERENTIAL/PLATELET
Basophils Absolute: 0.1 x10E3/uL (ref 0.0–0.2)
Basos: 1 %
EOS (ABSOLUTE): 0.2 x10E3/uL (ref 0.0–0.4)
Eos: 2 %
Hematocrit: 49.8 % (ref 37.5–51.0)
Hemoglobin: 16.7 g/dL (ref 13.0–17.7)
Immature Grans (Abs): 0.1 x10E3/uL (ref 0.0–0.1)
Immature Granulocytes: 1 %
Lymphocytes Absolute: 5.9 x10E3/uL — ABNORMAL HIGH (ref 0.7–3.1)
Lymphs: 56 %
MCH: 33.3 pg — ABNORMAL HIGH (ref 26.6–33.0)
MCHC: 33.5 g/dL (ref 31.5–35.7)
MCV: 99 fL — ABNORMAL HIGH (ref 79–97)
Monocytes Absolute: 1.1 x10E3/uL — ABNORMAL HIGH (ref 0.1–0.9)
Monocytes: 11 %
Neutrophils Absolute: 3 x10E3/uL (ref 1.4–7.0)
Neutrophils: 29 %
Platelets: 202 x10E3/uL (ref 150–450)
RBC: 5.02 x10E6/uL (ref 4.14–5.80)
RDW: 13.4 % (ref 11.6–15.4)
WBC: 10.3 x10E3/uL (ref 3.4–10.8)

## 2024-02-20 LAB — CMP14+EGFR
ALT: 17 IU/L (ref 0–44)
AST: 19 IU/L (ref 0–40)
Albumin: 4.2 g/dL (ref 3.9–4.9)
Alkaline Phosphatase: 101 IU/L (ref 44–121)
BUN/Creatinine Ratio: 22 (ref 10–24)
BUN: 15 mg/dL (ref 8–27)
Bilirubin Total: 0.4 mg/dL (ref 0.0–1.2)
CO2: 18 mmol/L — AB (ref 20–29)
Calcium: 9.3 mg/dL (ref 8.6–10.2)
Chloride: 106 mmol/L (ref 96–106)
Creatinine, Ser: 0.68 mg/dL — AB (ref 0.76–1.27)
Globulin, Total: 2.3 g/dL (ref 1.5–4.5)
Glucose: 114 mg/dL — AB (ref 70–99)
Potassium: 4.1 mmol/L (ref 3.5–5.2)
Sodium: 141 mmol/L (ref 134–144)
Total Protein: 6.5 g/dL (ref 6.0–8.5)
eGFR: 102 mL/min/1.73 (ref >59)

## 2024-02-21 ENCOUNTER — Telehealth: Payer: Self-pay | Admitting: Family Medicine

## 2024-02-21 NOTE — Telephone Encounter (Signed)
 Needs Diabetic Eye Exam

## 2024-05-05 ENCOUNTER — Other Ambulatory Visit: Payer: Self-pay | Admitting: Cardiology

## 2024-05-21 ENCOUNTER — Ambulatory Visit: Payer: Self-pay | Admitting: Family Medicine

## 2024-05-21 ENCOUNTER — Encounter: Payer: Self-pay | Admitting: Family Medicine

## 2024-05-21 ENCOUNTER — Ambulatory Visit (INDEPENDENT_AMBULATORY_CARE_PROVIDER_SITE_OTHER): Admitting: Family Medicine

## 2024-05-21 ENCOUNTER — Other Ambulatory Visit: Payer: Self-pay | Admitting: Family Medicine

## 2024-05-21 VITALS — BP 122/75 | HR 64 | Temp 98.2°F | Ht 70.0 in | Wt 236.0 lb

## 2024-05-21 DIAGNOSIS — Z23 Encounter for immunization: Secondary | ICD-10-CM | POA: Diagnosis not present

## 2024-05-21 DIAGNOSIS — N401 Enlarged prostate with lower urinary tract symptoms: Secondary | ICD-10-CM

## 2024-05-21 DIAGNOSIS — E785 Hyperlipidemia, unspecified: Secondary | ICD-10-CM

## 2024-05-21 DIAGNOSIS — I25119 Atherosclerotic heart disease of native coronary artery with unspecified angina pectoris: Secondary | ICD-10-CM

## 2024-05-21 DIAGNOSIS — B002 Herpesviral gingivostomatitis and pharyngotonsillitis: Secondary | ICD-10-CM

## 2024-05-21 DIAGNOSIS — E1159 Type 2 diabetes mellitus with other circulatory complications: Secondary | ICD-10-CM | POA: Diagnosis not present

## 2024-05-21 DIAGNOSIS — E1140 Type 2 diabetes mellitus with diabetic neuropathy, unspecified: Secondary | ICD-10-CM

## 2024-05-21 DIAGNOSIS — J449 Chronic obstructive pulmonary disease, unspecified: Secondary | ICD-10-CM

## 2024-05-21 DIAGNOSIS — E1169 Type 2 diabetes mellitus with other specified complication: Secondary | ICD-10-CM

## 2024-05-21 DIAGNOSIS — N3943 Post-void dribbling: Secondary | ICD-10-CM

## 2024-05-21 DIAGNOSIS — E1165 Type 2 diabetes mellitus with hyperglycemia: Secondary | ICD-10-CM

## 2024-05-21 DIAGNOSIS — E559 Vitamin D deficiency, unspecified: Secondary | ICD-10-CM

## 2024-05-21 DIAGNOSIS — I152 Hypertension secondary to endocrine disorders: Secondary | ICD-10-CM | POA: Diagnosis not present

## 2024-05-21 LAB — LIPID PANEL

## 2024-05-21 LAB — BAYER DCA HB A1C WAIVED: HB A1C (BAYER DCA - WAIVED): 5.6 % (ref 4.8–5.6)

## 2024-05-21 MED ORDER — VALACYCLOVIR HCL 500 MG PO TABS
500.0000 mg | ORAL_TABLET | Freq: Every day | ORAL | 1 refills | Status: AC
Start: 2024-05-21 — End: ?
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
## 2024-05-21 NOTE — Patient Instructions (Addendum)
 Nervive over the counter for neuropathy    Continue to monitor your blood sugars as we discussed and record them. Bring the log to your next appointment.  Take your medications as directed.    Goal Blood glucose:    Fasting (before meals) = 80 to 130   Within 2 hours of eating = less than 180   Understanding your Hemoglobin A1c: 5.6     Diabetes Mellitus and Nutrition    I think that you would greatly benefit from seeing a nutritionist. If this is something you are interested in, please call Dr Wonda at [phone] to schedule an appointment.   When you have diabetes (diabetes mellitus), it is very important to have healthy eating habits because your blood sugar (glucose) levels are greatly affected by what you eat and drink. Eating healthy foods in the appropriate amounts, at about the same times every day, can help you: Control your blood glucose. Lower your risk of heart disease. Improve your blood pressure. Reach or maintain a healthy weight.  Every person with diabetes is different, and each person has different needs for a meal plan. Your health care provider may recommend that you work with a diet and nutrition specialist (dietitian) to make a meal plan that is best for you. Your meal plan may vary depending on factors such as: The calories you need. The medicines you take. Your weight. Your blood glucose, blood pressure, and cholesterol levels. Your activity level. Other health conditions you have, such as heart or kidney disease.  How do carbohydrates affect me? Carbohydrates affect your blood glucose level more than any other type of food. Eating carbohydrates naturally increases the amount of glucose in your blood. Carbohydrate counting is a method for keeping track of how many carbohydrates you eat. Counting carbohydrates is important to keep your blood glucose at a healthy level, especially if you use insulin  or take certain oral diabetes medicines. It is important  to know how many carbohydrates you can safely have in each meal. This is different for every person. Your dietitian can help you calculate how many carbohydrates you should have at each meal and for snack. Foods that contain carbohydrates include: Bread, cereal, rice, pasta, and crackers. Potatoes and corn. Peas, beans, and lentils. Milk and yogurt. Fruit and juice. Desserts, such as cakes, cookies, ice cream, and candy.  How does alcohol affect me? Alcohol can cause a sudden decrease in blood glucose (hypoglycemia), especially if you use insulin  or take certain oral diabetes medicines. Hypoglycemia can be a life-threatening condition. Symptoms of hypoglycemia (sleepiness, dizziness, and confusion) are similar to symptoms of having too much alcohol. If your health care provider says that alcohol is safe for you, follow these guidelines: Limit alcohol intake to no more than 1 drink per day for nonpregnant women and 2 drinks per day for men. One drink equals 12 oz of beer, 5 oz of wine, or 1 oz of hard liquor. Do not drink on an empty stomach. Keep yourself hydrated with water, diet soda, or unsweetened iced tea. Keep in mind that regular soda, juice, and other mixers may contain a lot of sugar and must be counted as carbohydrates.  What are tips for following this plan?  Reading food labels Start by checking the serving size on the label. The amount of calories, carbohydrates, fats, and other nutrients listed on the label are based on one serving of the food. Many foods contain more than one serving per package. Check the total grams (  g) of carbohydrates in one serving. You can calculate the number of servings of carbohydrates in one serving by dividing the total carbohydrates by 15. For example, if a food has 30 g of total carbohydrates, it would be equal to 2 servings of carbohydrates. Check the number of grams (g) of saturated and trans fats in one serving. Choose foods that have low or no  amount of these fats. Check the number of milligrams (mg) of sodium in one serving. Most people should limit total sodium intake to less than 2,300 mg per day. Always check the nutrition information of foods labeled as low-fat or nonfat. These foods may be higher in added sugar or refined carbohydrates and should be avoided. Talk to your dietitian to identify your daily goals for nutrients listed on the label.  Shopping Avoid buying canned, premade, or processed foods. These foods tend to be high in fat, sodium, and added sugar. Shop around the outside edge of the grocery store. This includes fresh fruits and vegetables, bulk grains, fresh meats, and fresh dairy.  Cooking Use low-heat cooking methods, such as baking, instead of high-heat cooking methods like deep frying. Cook using healthy oils, such as olive, canola, or sunflower oil. Avoid cooking with butter, cream, or high-fat meats.  Meal planning Eat meals and snacks regularly, preferably at the same times every day. Avoid going long periods of time without eating. Eat foods high in fiber, such as fresh fruits, vegetables, beans, and whole grains. Talk to your dietitian about how many servings of carbohydrates you can eat at each meal. Eat 4-6 ounces of lean protein each day, such as lean meat, chicken, fish, eggs, or tofu. 1 ounce is equal to 1 ounce of meat, chicken, or fish, 1 egg, or 1/4 cup of tofu. Eat some foods each day that contain healthy fats, such as avocado, nuts, seeds, and fish.  Lifestyle  Check your blood glucose regularly. Exercise at least 30 minutes 5 or more days each week, or as told by your health care provider. Take medicines as told by your health care provider. Do not use any products that contain nicotine or tobacco, such as cigarettes and e-cigarettes. If you need help quitting, ask your health care provider. Work with a Veterinary surgeon or diabetes educator to identify strategies to manage stress and any  emotional and social challenges.  What are some questions to ask my health care provider? Do I need to meet with a diabetes educator? Do I need to meet with a dietitian? What number can I call if I have questions? When are the best times to check my blood glucose?  Where to find more information: American Diabetes Association: diabetes.org/food-and-fitness/food Academy of Nutrition and Dietetics: https://www.vargas.com/ [employer] of Diabetes and Digestive and Kidney Diseases (NIH): FindJewelers.cz  Summary A healthy meal plan will help you control your blood glucose and maintain a healthy lifestyle. Working with a diet and nutrition specialist (dietitian) can help you make a meal plan that is best for you. Keep in mind that carbohydrates and alcohol have immediate effects on your blood glucose levels. It is important to count carbohydrates and to use alcohol carefully. This information is not intended to replace advice given to you by your health care provider. Make sure you discuss any questions you have with your health care provider. Document Released: 04/26/2005 Document Revised: 09/03/2016 Document Reviewed: 09/03/2016 Elsevier Interactive Patient Education  [employer].

## 2024-05-22 LAB — CMP14+EGFR
ALT: 15 IU/L (ref 0–44)
AST: 18 IU/L (ref 0–40)
Albumin: 4.2 g/dL (ref 3.9–4.9)
Alkaline Phosphatase: 109 IU/L (ref 47–123)
BUN/Creatinine Ratio: 20 (ref 10–24)
BUN: 16 mg/dL (ref 8–27)
Bilirubin Total: 0.5 mg/dL (ref 0.0–1.2)
CO2: 19 mmol/L — AB (ref 20–29)
Calcium: 9.3 mg/dL (ref 8.6–10.2)
Chloride: 104 mmol/L (ref 96–106)
Creatinine, Ser: 0.8 mg/dL (ref 0.76–1.27)
Globulin, Total: 2.4 g/dL (ref 1.5–4.5)
Glucose: 126 mg/dL — AB (ref 70–99)
Potassium: 4.3 mmol/L (ref 3.5–5.2)
Sodium: 140 mmol/L (ref 134–144)
Total Protein: 6.6 g/dL (ref 6.0–8.5)
eGFR: 97 mL/min/1.73 (ref >59)

## 2024-05-22 LAB — CBC WITH DIFFERENTIAL/PLATELET
Basophils Absolute: 0.1 x10E3/uL (ref 0.0–0.2)
Basos: 1 %
EOS (ABSOLUTE): 0.3 x10E3/uL (ref 0.0–0.4)
Eos: 2 %
Hematocrit: 50.5 % (ref 37.5–51.0)
Hemoglobin: 17.5 g/dL (ref 13.0–17.7)
Immature Grans (Abs): 0.1 x10E3/uL (ref 0.0–0.1)
Immature Granulocytes: 1 %
Lymphocytes Absolute: 6.3 x10E3/uL — ABNORMAL HIGH (ref 0.7–3.1)
Lymphs: 52 %
MCH: 35 pg — ABNORMAL HIGH (ref 26.6–33.0)
MCHC: 34.7 g/dL (ref 31.5–35.7)
MCV: 101 fL — ABNORMAL HIGH (ref 79–97)
Monocytes Absolute: 1.2 x10E3/uL — ABNORMAL HIGH (ref 0.1–0.9)
Monocytes: 10 %
Neutrophils Absolute: 4.1 x10E3/uL (ref 1.4–7.0)
Neutrophils: 34 %
Platelets: 224 x10E3/uL (ref 150–450)
RBC: 5 x10E6/uL (ref 4.14–5.80)
RDW: 13.2 % (ref 11.6–15.4)
WBC: 12.1 x10E3/uL — ABNORMAL HIGH (ref 3.4–10.8)

## 2024-05-22 LAB — LIPID PANEL
Cholesterol, Total: 128 mg/dL (ref 100–199)
HDL: 38 mg/dL — AB (ref >39)
LDL CALC COMMENT:: 3.4 ratio (ref 0.0–5.0)
LDL Chol Calc (NIH): 57 mg/dL (ref 0–99)
Triglycerides: 203 mg/dL — AB (ref 0–149)
VLDL Cholesterol Cal: 33 mg/dL (ref 5–40)

## 2024-06-11 ENCOUNTER — Other Ambulatory Visit: Payer: Self-pay | Admitting: Cardiology

## 2024-06-25 ENCOUNTER — Encounter: Payer: Self-pay | Admitting: *Deleted

## 2024-06-29 ENCOUNTER — Other Ambulatory Visit: Payer: Self-pay | Admitting: Cardiology

## 2024-07-22 ENCOUNTER — Other Ambulatory Visit: Payer: Self-pay | Admitting: Cardiology

## 2024-08-18 ENCOUNTER — Encounter: Payer: Self-pay | Admitting: Family Medicine

## 2024-08-18 ENCOUNTER — Ambulatory Visit (INDEPENDENT_AMBULATORY_CARE_PROVIDER_SITE_OTHER): Admitting: Family Medicine

## 2024-08-18 VITALS — BP 128/72 | HR 60 | Temp 97.5°F | Ht 70.0 in | Wt 238.2 lb

## 2024-08-18 DIAGNOSIS — I152 Hypertension secondary to endocrine disorders: Secondary | ICD-10-CM | POA: Diagnosis not present

## 2024-08-18 DIAGNOSIS — E1165 Type 2 diabetes mellitus with hyperglycemia: Secondary | ICD-10-CM

## 2024-08-18 DIAGNOSIS — N3943 Post-void dribbling: Secondary | ICD-10-CM

## 2024-08-18 DIAGNOSIS — E1169 Type 2 diabetes mellitus with other specified complication: Secondary | ICD-10-CM

## 2024-08-18 DIAGNOSIS — Z7985 Long-term (current) use of injectable non-insulin antidiabetic drugs: Secondary | ICD-10-CM | POA: Diagnosis not present

## 2024-08-18 DIAGNOSIS — E559 Vitamin D deficiency, unspecified: Secondary | ICD-10-CM

## 2024-08-18 DIAGNOSIS — Z6834 Body mass index (BMI) 34.0-34.9, adult: Secondary | ICD-10-CM

## 2024-08-18 DIAGNOSIS — E66812 Obesity, class 2: Secondary | ICD-10-CM

## 2024-08-18 DIAGNOSIS — N401 Enlarged prostate with lower urinary tract symptoms: Secondary | ICD-10-CM

## 2024-08-18 DIAGNOSIS — E1159 Type 2 diabetes mellitus with other circulatory complications: Secondary | ICD-10-CM

## 2024-08-18 DIAGNOSIS — J449 Chronic obstructive pulmonary disease, unspecified: Secondary | ICD-10-CM

## 2024-08-18 DIAGNOSIS — E785 Hyperlipidemia, unspecified: Secondary | ICD-10-CM

## 2024-08-18 LAB — LIPID PANEL

## 2024-08-18 LAB — BAYER DCA HB A1C WAIVED: HB A1C (BAYER DCA - WAIVED): 5.8 % — ABNORMAL HIGH (ref 4.8–5.6)

## 2024-08-18 MED ORDER — ALFUZOSIN HCL ER 10 MG PO TB24: 10.0000 mg | ORAL_TABLET | Freq: Every day | ORAL | 3 refills | Status: AC
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
## 2024-08-18 NOTE — Patient Instructions (Signed)

## 2024-08-19 ENCOUNTER — Ambulatory Visit: Payer: Self-pay | Admitting: Family Medicine

## 2024-08-19 LAB — LIPID PANEL
Cholesterol, Total: 120 mg/dL (ref 100–199)
HDL: 46 mg/dL
LDL CALC COMMENT:: 2.6 ratio (ref 0.0–5.0)
LDL Chol Calc (NIH): 49 mg/dL (ref 0–99)
Triglycerides: 147 mg/dL (ref 0–149)
VLDL Cholesterol Cal: 25 mg/dL (ref 5–40)

## 2024-08-19 LAB — CBC WITH DIFFERENTIAL/PLATELET
Basophils Absolute: 0.1 x10E3/uL (ref 0.0–0.2)
Basos: 1 %
EOS (ABSOLUTE): 0.2 x10E3/uL (ref 0.0–0.4)
Eos: 2 %
Hematocrit: 53.6 % — ABNORMAL HIGH (ref 37.5–51.0)
Hemoglobin: 18.3 g/dL — ABNORMAL HIGH (ref 13.0–17.7)
Immature Grans (Abs): 0.1 x10E3/uL (ref 0.0–0.1)
Immature Granulocytes: 1 %
Lymphocytes Absolute: 5.4 x10E3/uL — ABNORMAL HIGH (ref 0.7–3.1)
Lymphs: 50 %
MCH: 33.8 pg — ABNORMAL HIGH (ref 26.6–33.0)
MCHC: 34.1 g/dL (ref 31.5–35.7)
MCV: 99 fL — ABNORMAL HIGH (ref 79–97)
Monocytes Absolute: 1 x10E3/uL — ABNORMAL HIGH (ref 0.1–0.9)
Monocytes: 9 %
Neutrophils Absolute: 3.9 x10E3/uL (ref 1.4–7.0)
Neutrophils: 37 %
Platelets: 228 x10E3/uL (ref 150–450)
RBC: 5.42 x10E6/uL (ref 4.14–5.80)
RDW: 13.2 % (ref 11.6–15.4)
WBC: 10.6 x10E3/uL (ref 3.4–10.8)

## 2024-08-19 LAB — CMP14+EGFR
ALT: 21 IU/L (ref 0–44)
AST: 26 IU/L (ref 0–40)
Albumin: 4.4 g/dL (ref 3.9–4.9)
Alkaline Phosphatase: 107 IU/L (ref 47–123)
BUN/Creatinine Ratio: 16 (ref 10–24)
BUN: 12 mg/dL (ref 8–27)
Bilirubin Total: 0.4 mg/dL (ref 0.0–1.2)
CO2: 19 mmol/L — ABNORMAL LOW (ref 20–29)
Calcium: 9.5 mg/dL (ref 8.6–10.2)
Chloride: 106 mmol/L (ref 96–106)
Creatinine, Ser: 0.75 mg/dL — ABNORMAL LOW (ref 0.76–1.27)
Globulin, Total: 2.4 g/dL (ref 1.5–4.5)
Glucose: 122 mg/dL — ABNORMAL HIGH (ref 70–99)
Potassium: 4.6 mmol/L (ref 3.5–5.2)
Sodium: 140 mmol/L (ref 134–144)
Total Protein: 6.8 g/dL (ref 6.0–8.5)
eGFR: 99 mL/min/1.73

## 2024-08-19 LAB — TSH: TSH: 0.796 u[IU]/mL (ref 0.450–4.500)

## 2024-08-19 LAB — VITAMIN D 25 HYDROXY (VIT D DEFICIENCY, FRACTURES): Vit D, 25-Hydroxy: 59.6 ng/mL (ref 30.0–100.0)

## 2024-08-19 LAB — PSA: Prostate Specific Ag, Serum: 0.9 ng/mL (ref 0.0–4.0)

## 2024-08-19 LAB — T4, FREE: Free T4: 1.35 ng/dL (ref 0.82–1.77)

## 2024-08-21 ENCOUNTER — Ambulatory Visit: Payer: Self-pay | Admitting: Family Medicine

## 2024-08-23 ENCOUNTER — Other Ambulatory Visit: Payer: Self-pay | Admitting: Cardiology

## 2024-09-01 ENCOUNTER — Encounter: Payer: Self-pay | Admitting: *Deleted

## 2024-09-16 ENCOUNTER — Other Ambulatory Visit: Payer: Self-pay | Admitting: Family Medicine

## 2024-09-16 DIAGNOSIS — E1165 Type 2 diabetes mellitus with hyperglycemia: Secondary | ICD-10-CM

## 2024-10-07 ENCOUNTER — Ambulatory Visit: Admitting: Cardiology

## 2024-11-20 ENCOUNTER — Ambulatory Visit: Admitting: Family Medicine

## 2024-12-02 ENCOUNTER — Ambulatory Visit: Admitting: Family Medicine

## 2025-02-24 ENCOUNTER — Encounter: Admitting: Family Medicine
